# Patient Record
Sex: Male | Born: 1935 | Race: White | Hispanic: No | Marital: Single | Smoking: Former smoker
Health system: Southern US, Community
[De-identification: ages and names within clinical notes are randomized; demographics above are authoritative.]

## PROBLEM LIST (undated history)

## (undated) ENCOUNTER — Emergency Department: Payer: PPO

## (undated) DIAGNOSIS — N200 Calculus of kidney: Secondary | ICD-10-CM

## (undated) DIAGNOSIS — I10 Essential (primary) hypertension: Secondary | ICD-10-CM

## (undated) DIAGNOSIS — E039 Hypothyroidism, unspecified: Secondary | ICD-10-CM

## (undated) DIAGNOSIS — I714 Abdominal aortic aneurysm, without rupture, unspecified: Secondary | ICD-10-CM

## (undated) DIAGNOSIS — Z87442 Personal history of urinary calculi: Secondary | ICD-10-CM

## (undated) DIAGNOSIS — E785 Hyperlipidemia, unspecified: Secondary | ICD-10-CM

## (undated) DIAGNOSIS — M1990 Unspecified osteoarthritis, unspecified site: Secondary | ICD-10-CM

## (undated) DIAGNOSIS — K279 Peptic ulcer, site unspecified, unspecified as acute or chronic, without hemorrhage or perforation: Secondary | ICD-10-CM

## (undated) HISTORY — DX: Hypothyroidism, unspecified: E03.9

## (undated) HISTORY — PX: OTHER SURGICAL HISTORY: SHX169

## (undated) HISTORY — DX: Peptic ulcer, site unspecified, unspecified as acute or chronic, without hemorrhage or perforation: K27.9

## (undated) HISTORY — DX: Calculus of kidney: N20.0

## (undated) HISTORY — PX: CATARACT EXTRACTION, BILATERAL: SHX1313

## (undated) HISTORY — DX: Abdominal aortic aneurysm, without rupture, unspecified: I71.40

## (undated) HISTORY — DX: Hyperlipidemia, unspecified: E78.5

## (undated) HISTORY — DX: Essential (primary) hypertension: I10

## (undated) HISTORY — DX: Abdominal aortic aneurysm, without rupture: I71.4

## (undated) HISTORY — PX: INGUINAL HERNIA REPAIR: SUR1180

## (undated) HISTORY — PX: TONSILLECTOMY: SUR1361

---

## 2001-06-17 ENCOUNTER — Encounter: Payer: Self-pay | Admitting: Ophthalmology

## 2001-06-18 ENCOUNTER — Ambulatory Visit (HOSPITAL_COMMUNITY): Admission: RE | Admit: 2001-06-18 | Discharge: 2001-06-18 | Payer: Self-pay | Admitting: Ophthalmology

## 2001-08-23 ENCOUNTER — Encounter: Payer: Self-pay | Admitting: Otolaryngology

## 2001-08-23 ENCOUNTER — Encounter: Admission: RE | Admit: 2001-08-23 | Discharge: 2001-08-23 | Payer: Self-pay | Admitting: Otolaryngology

## 2002-01-17 ENCOUNTER — Encounter: Admission: RE | Admit: 2002-01-17 | Discharge: 2002-01-17 | Payer: Self-pay | Admitting: Family Medicine

## 2002-01-17 ENCOUNTER — Encounter: Payer: Self-pay | Admitting: Family Medicine

## 2014-08-26 ENCOUNTER — Other Ambulatory Visit (HOSPITAL_COMMUNITY): Payer: Self-pay | Admitting: Internal Medicine

## 2014-08-26 DIAGNOSIS — I447 Left bundle-branch block, unspecified: Secondary | ICD-10-CM

## 2014-09-03 ENCOUNTER — Other Ambulatory Visit (HOSPITAL_COMMUNITY): Payer: Self-pay

## 2014-09-03 DIAGNOSIS — I447 Left bundle-branch block, unspecified: Secondary | ICD-10-CM

## 2014-09-09 ENCOUNTER — Telehealth (HOSPITAL_COMMUNITY): Payer: Self-pay

## 2014-09-09 NOTE — Telephone Encounter (Signed)
Encounter complete. 

## 2014-09-11 ENCOUNTER — Ambulatory Visit (HOSPITAL_COMMUNITY)
Admission: RE | Admit: 2014-09-11 | Discharge: 2014-09-11 | Disposition: A | Discharge status: Home / Self Care | Payer: Medicare Other | Source: Ambulatory Visit | Attending: Cardiovascular Disease | Admitting: Cardiovascular Disease

## 2014-09-11 ENCOUNTER — Encounter (HOSPITAL_COMMUNITY): Payer: Self-pay

## 2014-09-11 DIAGNOSIS — Z8249 Family history of ischemic heart disease and other diseases of the circulatory system: Secondary | ICD-10-CM | POA: Insufficient documentation

## 2014-09-11 DIAGNOSIS — I447 Left bundle-branch block, unspecified: Secondary | ICD-10-CM | POA: Diagnosis not present

## 2014-09-11 DIAGNOSIS — I10 Essential (primary) hypertension: Secondary | ICD-10-CM | POA: Insufficient documentation

## 2014-09-11 DIAGNOSIS — E785 Hyperlipidemia, unspecified: Secondary | ICD-10-CM | POA: Diagnosis not present

## 2014-09-11 DIAGNOSIS — R0609 Other forms of dyspnea: Secondary | ICD-10-CM | POA: Diagnosis present

## 2014-09-11 MED ORDER — TECHNETIUM TC 99M SESTAMIBI GENERIC - CARDIOLITE
10.5000 | Freq: Once | INTRAVENOUS | Status: AC | PRN
  Administered 2014-09-11: 11 via INTRAVENOUS

## 2014-09-11 MED ORDER — TECHNETIUM TC 99M SESTAMIBI GENERIC - CARDIOLITE
30.2000 | Freq: Once | INTRAVENOUS | Status: AC | PRN
Start: 2014-09-11 — End: 2014-09-11
  Administered 2014-09-11: 30.2 via INTRAVENOUS

## 2014-09-11 MED ORDER — REGADENOSON 0.4 MG/5ML IV SOLN
0.4000 mg | Freq: Once | INTRAVENOUS | Status: AC
  Administered 2014-09-11: 0.4 mg via INTRAVENOUS

## 2014-09-11 NOTE — Procedures (Addendum)
[facility] [address] [place] [address] [phone]  Cardiology Nuclear Med Study  Brandon Valdez is a 80 y.o. male     MRN : [id number]     DOB: [birth year]  Procedure Date: 09/11/2014  Nuclear Med Background Indication for Stress Test:  Evaluation for Ischemia History:  No prior cardiac or respiratory history reported. No prior NUC MPI for comparison. Cardiac Risk Factors: Family History - CAD, History of Smoking, Hypertension, LBBB and Lipids  Symptoms:  DOE   Nuclear Pre-Procedure Caffeine/Decaff Intake:  1:00am NPO After: 11am   IV Site: R Forearm  IV 0.9% NS with Angio Cath:  22g  Chest Size (in):  44"  IV Started by: Berdie OgrenAmanda Wease, RN  Height: 5\' 11"  (1.803 m)  Cup Size: n/a  BMI:  Body mass index is 27.07 kg/(m^2). Weight:  194 lb (87.998 kg)   Tech Comments:  n/a    Nuclear Med Study 1 or 2 day study: 1 day  Stress Test Type:  Lexiscan  Order Authorizing Provider:  Thayer HeadingsBrian MacKenzie, MD   Resting Radionuclide: Technetium 4115m Sestamibi  Resting Radionuclide Dose: 10.5 mCi   Stress Radionuclide:  Technetium 3915m Sestamibi  Stress Radionuclide Dose: 30.2 mCi           Stress Protocol Rest HR: 54 Stress HR: 76  Rest BP: 181/104 Stress BP: 160/83  Exercise Time (min): n/a METS: n/a   Predicted Max HR: 142 bpm % Max HR: 53.52 bpm Rate Pressure Product: [id number]  Dose of Adenosine (mg):  n/a Dose of Lexiscan: 0.4 mg  Dose of Atropine (mg): n/a Dose of Dobutamine: n/a mcg/kg/min (at max HR)  Stress Test Technologist: Esperanza Sheetserry-Marie Martin, CCT Nuclear Technologist: Gonzella LexPam Phillips, CNMT   Rest Procedure:  Myocardial perfusion imaging was performed at rest 45 minutes following the intravenous administration of Technetium 4715m Sestamibi. Stress Procedure:  The patient received IV Lexiscan 0.4 mg over 15-seconds.  Technetium 8715m Sestamibi injected IV at 30-seconds.  There were no significant changes with Lexiscan.   Quantitative spect images were obtained after a 45 minute delay.  Transient Ischemic Dilatation (Normal <1.22):  1.01  QGS EDV:  109 ml QGS ESV:  59 ml LV Ejection Fraction: 46%       Rest ECG: NSR-LBBB  Stress ECG: No significant change from baseline ECG  QPS Raw Data Images:  Normal; no motion artifact; normal heart/lung ratio. Stress Images:  There is decreased uptake in the septum. Rest Images:  There is decreased uptake in the septum. Subtraction (SDS):  No reversibility is appreciated.  Impression Exercise Capacity:  Lexiscan with no exercise. BP Response:  Normal blood pressure response. Clinical Symptoms:  No significant symptoms noted. ECG Impression:  No significant ST segment change suggestive of ischemia. Comparison with Prior Nuclear Study: No images to compare  Overall Impression:  Low risk stress nuclear study BBB artifact.  LV Wall Motion:  Mild LV dysfunction with discoordinate septal motion c/w BBB    [name],[name], MD  09/11/2014 5:24 PM

## 2015-12-02 DIAGNOSIS — N200 Calculus of kidney: Secondary | ICD-10-CM | POA: Diagnosis not present

## 2015-12-02 DIAGNOSIS — R911 Solitary pulmonary nodule: Secondary | ICD-10-CM | POA: Diagnosis not present

## 2015-12-02 DIAGNOSIS — N281 Cyst of kidney, acquired: Secondary | ICD-10-CM | POA: Diagnosis not present

## 2015-12-02 DIAGNOSIS — Z0000 Encounter for general adult medical examination without abnormal findings: Secondary | ICD-10-CM | POA: Diagnosis not present

## 2015-12-02 DIAGNOSIS — N400 Enlarged prostate without lower urinary tract symptoms: Secondary | ICD-10-CM | POA: Diagnosis not present

## 2015-12-02 DIAGNOSIS — I714 Abdominal aortic aneurysm, without rupture: Secondary | ICD-10-CM | POA: Diagnosis not present

## 2015-12-02 DIAGNOSIS — R3121 Asymptomatic microscopic hematuria: Secondary | ICD-10-CM | POA: Diagnosis not present

## 2016-02-28 DIAGNOSIS — N400 Enlarged prostate without lower urinary tract symptoms: Secondary | ICD-10-CM | POA: Diagnosis not present

## 2016-02-28 DIAGNOSIS — I129 Hypertensive chronic kidney disease with stage 1 through stage 4 chronic kidney disease, or unspecified chronic kidney disease: Secondary | ICD-10-CM | POA: Diagnosis not present

## 2016-03-07 DIAGNOSIS — I714 Abdominal aortic aneurysm, without rupture: Secondary | ICD-10-CM | POA: Diagnosis not present

## 2016-03-07 DIAGNOSIS — F17211 Nicotine dependence, cigarettes, in remission: Secondary | ICD-10-CM | POA: Diagnosis not present

## 2016-03-07 DIAGNOSIS — N182 Chronic kidney disease, stage 2 (mild): Secondary | ICD-10-CM | POA: Diagnosis not present

## 2016-03-07 DIAGNOSIS — E785 Hyperlipidemia, unspecified: Secondary | ICD-10-CM | POA: Diagnosis not present

## 2016-03-07 DIAGNOSIS — I129 Hypertensive chronic kidney disease with stage 1 through stage 4 chronic kidney disease, or unspecified chronic kidney disease: Secondary | ICD-10-CM | POA: Diagnosis not present

## 2016-03-09 DIAGNOSIS — M542 Cervicalgia: Secondary | ICD-10-CM | POA: Diagnosis not present

## 2016-03-14 DIAGNOSIS — M542 Cervicalgia: Secondary | ICD-10-CM | POA: Diagnosis not present

## 2016-03-17 DIAGNOSIS — M542 Cervicalgia: Secondary | ICD-10-CM | POA: Diagnosis not present

## 2016-03-22 DIAGNOSIS — M542 Cervicalgia: Secondary | ICD-10-CM | POA: Diagnosis not present

## 2016-03-24 DIAGNOSIS — M542 Cervicalgia: Secondary | ICD-10-CM | POA: Diagnosis not present

## 2016-03-29 DIAGNOSIS — M542 Cervicalgia: Secondary | ICD-10-CM | POA: Diagnosis not present

## 2016-04-10 DIAGNOSIS — I129 Hypertensive chronic kidney disease with stage 1 through stage 4 chronic kidney disease, or unspecified chronic kidney disease: Secondary | ICD-10-CM | POA: Diagnosis not present

## 2016-06-06 DIAGNOSIS — M8580 Other specified disorders of bone density and structure, unspecified site: Secondary | ICD-10-CM | POA: Diagnosis not present

## 2016-06-06 DIAGNOSIS — I129 Hypertensive chronic kidney disease with stage 1 through stage 4 chronic kidney disease, or unspecified chronic kidney disease: Secondary | ICD-10-CM | POA: Diagnosis not present

## 2016-06-08 DIAGNOSIS — R310 Gross hematuria: Secondary | ICD-10-CM | POA: Diagnosis not present

## 2016-06-13 DIAGNOSIS — I714 Abdominal aortic aneurysm, without rupture: Secondary | ICD-10-CM | POA: Diagnosis not present

## 2016-06-13 DIAGNOSIS — N182 Chronic kidney disease, stage 2 (mild): Secondary | ICD-10-CM | POA: Diagnosis not present

## 2016-06-13 DIAGNOSIS — F17211 Nicotine dependence, cigarettes, in remission: Secondary | ICD-10-CM | POA: Diagnosis not present

## 2016-06-13 DIAGNOSIS — I129 Hypertensive chronic kidney disease with stage 1 through stage 4 chronic kidney disease, or unspecified chronic kidney disease: Secondary | ICD-10-CM | POA: Diagnosis not present

## 2016-06-13 DIAGNOSIS — E785 Hyperlipidemia, unspecified: Secondary | ICD-10-CM | POA: Diagnosis not present

## 2016-09-12 DIAGNOSIS — Z23 Encounter for immunization: Secondary | ICD-10-CM | POA: Diagnosis not present

## 2016-09-12 DIAGNOSIS — N390 Urinary tract infection, site not specified: Secondary | ICD-10-CM | POA: Diagnosis not present

## 2016-09-12 DIAGNOSIS — Z0000 Encounter for general adult medical examination without abnormal findings: Secondary | ICD-10-CM | POA: Diagnosis not present

## 2016-09-12 DIAGNOSIS — I129 Hypertensive chronic kidney disease with stage 1 through stage 4 chronic kidney disease, or unspecified chronic kidney disease: Secondary | ICD-10-CM | POA: Diagnosis not present

## 2016-09-12 DIAGNOSIS — Z125 Encounter for screening for malignant neoplasm of prostate: Secondary | ICD-10-CM | POA: Diagnosis not present

## 2016-09-12 DIAGNOSIS — E559 Vitamin D deficiency, unspecified: Secondary | ICD-10-CM | POA: Diagnosis not present

## 2016-09-12 DIAGNOSIS — M8580 Other specified disorders of bone density and structure, unspecified site: Secondary | ICD-10-CM | POA: Diagnosis not present

## 2016-09-19 DIAGNOSIS — R911 Solitary pulmonary nodule: Secondary | ICD-10-CM | POA: Diagnosis not present

## 2016-09-19 DIAGNOSIS — I714 Abdominal aortic aneurysm, without rupture: Secondary | ICD-10-CM | POA: Diagnosis not present

## 2016-09-19 DIAGNOSIS — I10 Essential (primary) hypertension: Secondary | ICD-10-CM | POA: Diagnosis not present

## 2016-09-19 DIAGNOSIS — R3129 Other microscopic hematuria: Secondary | ICD-10-CM | POA: Diagnosis not present

## 2016-10-18 ENCOUNTER — Encounter: Payer: Self-pay | Admitting: Vascular Surgery

## 2016-10-18 DIAGNOSIS — H1840 Unspecified corneal degeneration: Secondary | ICD-10-CM | POA: Diagnosis not present

## 2016-10-18 DIAGNOSIS — H524 Presbyopia: Secondary | ICD-10-CM | POA: Diagnosis not present

## 2016-10-24 ENCOUNTER — Ambulatory Visit (INDEPENDENT_AMBULATORY_CARE_PROVIDER_SITE_OTHER): Payer: PPO | Admitting: Vascular Surgery

## 2016-10-24 ENCOUNTER — Encounter: Payer: Self-pay | Admitting: Vascular Surgery

## 2016-10-24 ENCOUNTER — Other Ambulatory Visit: Payer: Self-pay | Admitting: *Deleted

## 2016-10-24 VITALS — BP 146/80 | HR 75 | Temp 97.2°F | Resp 20 | Ht 71.0 in | Wt 194.5 lb

## 2016-10-24 DIAGNOSIS — I714 Abdominal aortic aneurysm, without rupture, unspecified: Secondary | ICD-10-CM

## 2016-10-24 DIAGNOSIS — I739 Peripheral vascular disease, unspecified: Secondary | ICD-10-CM

## 2016-10-24 NOTE — Progress Notes (Signed)
Referred by: Dr. Thayer HeadingsBrian Mackenzie  History of Present Illness:  Patient is a 82 y.o. year old male who presents for evaluation of abdominal aortic aneurysm.  The aneurysm is currently 4.2 cm in diameter by CT performed on 11/12/2015.  The patient denies abdominal pain.  The patient denies back pain.  The patient hahas family history of AAA.  Other medical problems include HTN managed with Cozaar and Aspirin daily.  He denise DM and CAD.  History reviewed. No pertinent past medical history.  Past Surgical History:  Procedure Laterality Date  . CATARACT EXTRACTION, BILATERAL    . INGUINAL HERNIA REPAIR    . spine cyst    . TONSILLECTOMY       Social History Social History  Substance Use Topics  . Smoking status: Former Games developermoker  . Smokeless tobacco: Former NeurosurgeonUser    Quit date: 11/21/1975  . Alcohol use Yes    Family History Family History  Problem Relation Age of Onset  . AAA (abdominal aortic aneurysm) Father     Allergies  Allergies  Allergen Reactions  . Monopril [Fosinopril] Other (See Comments)    Unspecified   . Valium [Diazepam] Other (See Comments)    unspecified     Current Outpatient Prescriptions  Medication Sig Dispense Refill  . Ascorbic Acid (VITAMIN C) 1000 MG tablet Take 1,000 mg by mouth daily.    Marland Kitchen. aspirin 81 MG tablet Take 81 mg by mouth daily.    Marland Kitchen. ibuprofen (ADVIL,MOTRIN) 200 MG tablet Take 200 mg by mouth every 6 (six) hours as needed.    Marland Kitchen. levothyroxine (SYNTHROID, LEVOTHROID) 125 MCG tablet     . losartan (COZAAR) 100 MG tablet     . Multiple Vitamin (MULTIVITAMIN) capsule Take 1 capsule by mouth daily.    . psyllium (METAMUCIL) 58.6 % packet Take 1 packet by mouth daily.    Marland Kitchen. SILDENAFIL CITRATE PO Take by mouth.    . vitamin E 400 UNIT capsule Take 400 Units by mouth daily.     No current facility-administered medications for this visit.     ROS:   General:  No weight loss, Fever, chills  HEENT: No recent headaches, no nasal  bleeding, no visual changes, no sore throat  Neurologic: No dizziness, blackouts, seizures. No recent symptoms of stroke or mini- stroke. No recent episodes of slurred speech, or temporary blindness.  Cardiac: No recent episodes of chest pain/pressure, no shortness of breath at rest.  No shortness of breath with exertion.  Denies history of atrial fibrillation or irregular heartbeat  Vascular: No history of rest pain in feet.  No history of claudication.  No history of non-healing ulcer, No history of DVT   Pulmonary: No home oxygen, no productive cough, no hemoptysis,  No asthma or wheezing  Musculoskeletal:  [x ] Arthritis, [ ]  Low back pain,  [ ]  Joint pain  Hematologic:No history of hypercoagulable state.  No history of easy bleeding.  No history of anemia  Gastrointestinal: No hematochezia or melena,  No gastroesophageal reflux, no trouble swallowing  Urinary: [ ]  chronic Kidney disease, [ ]  on HD - [ ]  MWF or [ ]  TTHS, [ ]  Burning with urination, [ ]  Frequent urination, [ ]  Difficulty urinating;   Skin: No rashes  Psychological: No history of anxiety,  No history of depression   Physical Examination  Vitals:   10/24/16 1412 10/24/16 1415  BP: (!) 154/83 (!) 146/80  Pulse: 75   Resp: 20  Temp: 97.2 F (36.2 C)   TempSrc: Oral   SpO2: 95%   Weight: 194 lb 8 oz (88.2 kg)   Height: 5\' 11"  (1.803 m)     Body mass index is 27.13 kg/m.  General:  Alert and oriented, no acute distress HEENT: Normal Neck: No bruit or JVD Pulmonary: Clear to auscultation bilaterally Cardiac: Regular Rate and Rhythm without murmur Gastrointestinal: Soft, non-tender, non-distended, no mass, no scars, palpable abdominal aortic pulse. Skin: No rash Extremity Pulses:  2+ radial, brachial, femoral, popliteal, dorsalis pedis, posterior tibial pulses bilaterally Musculoskeletal: No deformity or edema  Neurologic: Upper and lower extremity motor 5/5 and symmetric  DATA:  CT abdomin Aorta  below the renals measuring 3.0 and largest diameter 4.2 cm.  With prominent iliacs as well symetrically.   ASSESSMENT:  Arterial Megally   PLAN: Aortic and bilateral popliteal arteries ultrasound for surveillance sometime in the next 2-3 weeks.  Dr. Arbie CookeyEarly will review these studies.  Then he will follow up in 1 year for repeat Ultrasound studies.    Activity as tolerates without restrictions. We recommend control up HTN.   Thomasena EdisOLLINS, EMMA MAUREEN PA-C [facility]  The patient was seen in conjunction with Dr. Arbie CookeyEarly today  I have examined the patient, reviewed and agree with above. Very pleasant gentleman with incidental finding of a 4 cm aneurysm on CT scan evaluating hematuria one year ago. He is quite anxious about this since his father died of a ruptured aneurysm. He has not had any further imaging studies since his last study in one year ago in December. I did review his actual films. This does reveal diffuse arteriomegaly. His native aorta at the level of the renals is 3 cm in his maximal diameter is 4.1 cm. I discussed the significance of this with the patient and his wife explaining that this in all likelihood is less risky than 4 cm dilatation with a normal caliber artery. He does have prominent popliteal pulses as well. Since he is one year out from any imaging studies we will obtain a duplex of his aorta and popliteals now within the next 1 or 2 weeks at his convenience. Assuming this is unchanged we will then follow him on a yearly basis with a duplex of his aorta to rule out progression in size  Gretta BeganEarly, Rickie Gange, MD 10/24/2016 3:06 PM

## 2016-10-25 ENCOUNTER — Encounter: Payer: Self-pay | Admitting: Urology

## 2016-10-25 ENCOUNTER — Encounter: Payer: Self-pay | Admitting: Internal Medicine

## 2016-10-25 ENCOUNTER — Other Ambulatory Visit: Payer: Self-pay | Admitting: Internal Medicine

## 2016-10-25 DIAGNOSIS — R911 Solitary pulmonary nodule: Secondary | ICD-10-CM

## 2016-10-26 ENCOUNTER — Other Ambulatory Visit: Payer: PPO

## 2016-10-27 ENCOUNTER — Telehealth: Payer: Self-pay | Admitting: Cardiology

## 2016-10-27 NOTE — Telephone Encounter (Signed)
[facility] 11/02/16 with Dr Jens Somrenshaw.  [facility] for Dr Ludwig Clarksrenshaw's schedule on 11/02/16. lp

## 2016-10-30 NOTE — Progress Notes (Signed)
HPI: 82 year old male for evaluation of AAA. Nuclear study October 2015 showed ejection fraction 46% with left bundle branch block artifact and no ischemia. CT December 2016 showed 4.2 cm abdominal aortic aneurysm which is being followed by vascular surgery. Also note of lung nodule and coronary calcification. Patient denies dyspnea, chest pain, palpitations, syncope or claudication. He has no abdominal pain.   Current Outpatient Prescriptions  Medication Sig Dispense Refill  . Ascorbic Acid (VITAMIN C) 1000 MG tablet Take 1,000 mg by mouth daily.    Marland Kitchen. aspirin 81 MG tablet Take 81 mg by mouth daily.    Marland Kitchen. ibuprofen (ADVIL,MOTRIN) 200 MG tablet Take 200 mg by mouth every 6 (six) hours as needed.    Marland Kitchen. levothyroxine (SYNTHROID, LEVOTHROID) 125 MCG tablet     . losartan (COZAAR) 100 MG tablet     . Multiple Vitamin (MULTIVITAMIN) capsule Take 1 capsule by mouth daily.    . psyllium (METAMUCIL) 58.6 % packet Take 1 packet by mouth daily.    Marland Kitchen. SILDENAFIL CITRATE PO Take by mouth.    . vitamin E 400 UNIT capsule Take 400 Units by mouth daily.     No current facility-administered medications for this visit.     Allergies  Allergen Reactions  . Monopril [Fosinopril] Other (See Comments)    Unspecified   . Valium [Diazepam] Other (See Comments)    unspecified     Past Medical History:  Diagnosis Date  . AAA (abdominal aortic aneurysm) (HCC)   . Hyperlipidemia   . Hypertension   . Hypothyroid   . Nephrolithiasis   . PUD (peptic ulcer disease)     Past Surgical History:  Procedure Laterality Date  . CATARACT EXTRACTION, BILATERAL    . INGUINAL HERNIA REPAIR    . spine cyst    . TONSILLECTOMY      Social History   Social History  . Marital status: Single    Spouse name: N/A  . Number of children: 3  . Years of education: N/A   Occupational History  . Not on file.   Social History Main Topics  . Smoking status: Former Games developermoker  . Smokeless tobacco: Former NeurosurgeonUser   Quit date: 11/21/1975  . Alcohol use Yes     Comment: Occasional  . Drug use: No  . Sexual activity: Not on file   Other Topics Concern  . Not on file   Social History Narrative  . No narrative on file    Family History  Problem Relation Age of Onset  . AAA (abdominal aortic aneurysm) Father   . AAA (abdominal aortic aneurysm) Brother     ROS: no fevers or chills, productive cough, hemoptysis, dysphasia, odynophagia, melena, hematochezia, dysuria, hematuria, rash, seizure activity, orthopnea, PND, pedal edema, claudication. Remaining systems are negative.  Physical Exam:   Blood pressure (!) 152/84, pulse 76, height 5\' 9"  (1.753 m), weight 197 lb (89.4 kg).  General:  Well developed/well nourished in NAD Skin warm/dry Patient not depressed No peripheral clubbing Back-normal HEENT-normal/normal eyelids Neck supple/normal carotid upstroke bilaterally; no bruits; no JVD; no thyromegaly chest - CTA/ normal expansion CV - RRR/normal S1 and S2; no murmurs, rubs or gallops;  PMI nondisplaced Abdomen -NT/ND, no HSM, no mass, + bowel sounds, no bruit 2+ femoral pulses, no bruits Ext-no edema, chords, 2+ DP Neuro-grossly nonfocal  ECG sinus rhythm at a rate of 76. Left bundle branch block.  A/P  1 abdominal aortic aneurysm-plan follow-up ultrasound as scheduled on December  18. He is followed by vascular surgery. We discussed that it typically would not need repair until approximately 5.5 cm.  2 hypertension-blood pressure is mildly elevated. I have asked him to follow this at home and we will add additional medications as needed.  3 hyperlipidemia-patient is intolerant to statins. Continue diet. Managed by primary care.   Olga MillersBrian Crenshaw, MD

## 2016-10-31 ENCOUNTER — Ambulatory Visit (HOSPITAL_COMMUNITY)
Admission: RE | Admit: 2016-10-31 | Discharge: 2016-10-31 | Disposition: A | Discharge status: Home / Self Care | Payer: PPO | Source: Ambulatory Visit | Attending: Vascular Surgery | Admitting: Vascular Surgery

## 2016-10-31 ENCOUNTER — Other Ambulatory Visit: Payer: Self-pay

## 2016-10-31 DIAGNOSIS — I714 Abdominal aortic aneurysm, without rupture, unspecified: Secondary | ICD-10-CM

## 2016-10-31 DIAGNOSIS — I739 Peripheral vascular disease, unspecified: Secondary | ICD-10-CM

## 2016-11-02 ENCOUNTER — Encounter: Payer: Self-pay | Admitting: Cardiology

## 2016-11-02 ENCOUNTER — Ambulatory Visit (INDEPENDENT_AMBULATORY_CARE_PROVIDER_SITE_OTHER): Payer: PPO | Admitting: Cardiology

## 2016-11-02 VITALS — BP 152/84 | HR 76 | Ht 69.0 in | Wt 197.0 lb

## 2016-11-02 DIAGNOSIS — I714 Abdominal aortic aneurysm, without rupture, unspecified: Secondary | ICD-10-CM

## 2016-11-02 DIAGNOSIS — I10 Essential (primary) hypertension: Secondary | ICD-10-CM | POA: Diagnosis not present

## 2016-11-02 DIAGNOSIS — E7800 Pure hypercholesterolemia, unspecified: Secondary | ICD-10-CM | POA: Diagnosis not present

## 2016-11-02 NOTE — Patient Instructions (Signed)
Your physician wants you to follow-up in: ONE YEAR WITH DR CRENSHAW You will receive a reminder letter in the mail two months in advance. If you don't receive a letter, please call our office to schedule the follow-up appointment.   If you need a refill on your cardiac medications before your next appointment, please call your pharmacy.  

## 2016-11-06 ENCOUNTER — Encounter: Payer: Self-pay | Admitting: Vascular Surgery

## 2016-11-07 ENCOUNTER — Ambulatory Visit (INDEPENDENT_AMBULATORY_CARE_PROVIDER_SITE_OTHER): Payer: PPO | Admitting: Vascular Surgery

## 2016-11-07 ENCOUNTER — Ambulatory Visit (HOSPITAL_COMMUNITY)
Admission: RE | Admit: 2016-11-07 | Discharge: 2016-11-07 | Disposition: A | Discharge status: Home / Self Care | Payer: PPO | Source: Ambulatory Visit | Attending: Vascular Surgery | Admitting: Vascular Surgery

## 2016-11-07 ENCOUNTER — Encounter: Payer: Self-pay | Admitting: Vascular Surgery

## 2016-11-07 VITALS — BP 160/88 | HR 64 | Temp 97.0°F | Resp 18 | Ht 69.0 in | Wt 194.5 lb

## 2016-11-07 DIAGNOSIS — I714 Abdominal aortic aneurysm, without rupture, unspecified: Secondary | ICD-10-CM

## 2016-11-07 NOTE — Progress Notes (Signed)
[facility]  Patient name: Brandon HughJimmy C Seger MRN: [id number] DOB: 1934/03/01 Sex: male  REASON FOR VISIT: Ultrasound follow-up of abdominal aortic aneurysm and popliteal enlargement  HPI: Brandon Valdez is a 82 y.o. male here today with his son for follow-up. She had a CT scan for other causes showing arteriomegaly and a infrarenal abdominal aortic aneurysm. His last scan was one year ago. He is here today for ultrasound follow-up. Tonight seen him several weeks ago he had not had his imaging studies and on physical exam did have prominent popliteal pulses. He underwent the popliteal duplexes well. He is here today with his son. He does have a very strong family history of aneurysms in his father with a death from a rupture and also in his brother. He remains in very good health.  Past Medical History:  Diagnosis Date  . AAA (abdominal aortic aneurysm) (HCC)   . Hyperlipidemia   . Hypertension   . Hypothyroid   . Nephrolithiasis   . PUD (peptic ulcer disease)     Family History  Problem Relation Age of Onset  . AAA (abdominal aortic aneurysm) Father   . AAA (abdominal aortic aneurysm) Brother     SOCIAL HISTORY: Social History  Substance Use Topics  . Smoking status: Former Games developermoker  . Smokeless tobacco: Former NeurosurgeonUser    Quit date: 11/21/1975  . Alcohol use Yes     Comment: Occasional    Allergies  Allergen Reactions  . Monopril [Fosinopril] Other (See Comments)    Unspecified   . Valium [Diazepam] Other (See Comments)    unspecified    Current Outpatient Prescriptions  Medication Sig Dispense Refill  . Ascorbic Acid (VITAMIN C) 1000 MG tablet Take 1,000 mg by mouth daily.    Marland Kitchen. aspirin 81 MG tablet Take 81 mg by mouth daily.    Marland Kitchen. ibuprofen (ADVIL,MOTRIN) 200 MG tablet Take 200 mg by mouth every 6 (six) hours as needed.    Marland Kitchen. levothyroxine (SYNTHROID, LEVOTHROID) 125 MCG tablet     . losartan (COZAAR)  100 MG tablet     . Multiple Vitamin (MULTIVITAMIN) capsule Take 1 capsule by mouth daily.    . psyllium (METAMUCIL) 58.6 % packet Take 1 packet by mouth daily.    Marland Kitchen. SILDENAFIL CITRATE PO Take by mouth.    . vitamin E 400 UNIT capsule Take 400 Units by mouth daily.     No current facility-administered medications for this visit.     REVIEW OF SYSTEMS:  [X]  denotes positive finding, [ ]  denotes negative finding Cardiac  Comments:  Chest pain or chest pressure:    Shortness of breath upon exertion:    Short of breath when lying flat:    Irregular heart rhythm:        Vascular    Pain in calf, thigh, or hip brought on by ambulation:    Pain in feet at night that wakes you up from your sleep:     Blood clot in your veins:    Leg swelling:           PHYSICAL EXAM: Vitals:   11/07/16 0846 11/07/16 0848  BP: (!) 156/91 (!) 160/88  Pulse: 64   Resp: 18   Temp: 97 F (36.1 C)   TempSrc: Oral   SpO2: 94%   Weight: 194 lb 8 oz (88.2 kg)   Height: 5\' 9"  (1.753 m)     GENERAL: The patient is a well-nourished male,  in no acute distress. The vital signs are documented above. CARDIOVASCULAR: 2+ radial 2+ femoral 2-3+ popliteal and 3+ posterior tibial pulses bilaterally PULMONARY: There is good air exchange  MUSCULOSKELETAL: There are no major deformities or cyanosis. NEUROLOGIC: No focal weakness or paresthesias are detected. SKIN: There are no ulcers or rashes noted. PSYCHIATRIC: The patient has a normal affect. Abdomen soft and nontender. No aneurysm palpable  DATA:  Popliteal duplex from 10/31/2016 revealed slight popliteal dilatation to just over 1 cm. Aortic duplex today reveals maximal diameter 3.9 cm  MEDICAL ISSUES: Stable overall. Diffuse arteriomegaly with small abdominal aortic aneurysm. No evidence of popliteal artery aneurysms. Have recommended continued yearly ultrasound of his aorta to rule out enlargement. We will coordinate this in one year and he will see our  nurse practitioner for ongoing follow-up.    Larina Earthlyodd F. Jeweldean Drohan, MD FACS [facility] Office Tel [phone] Pager [phone]

## 2016-11-10 DIAGNOSIS — R05 Cough: Secondary | ICD-10-CM | POA: Diagnosis not present

## 2016-11-10 DIAGNOSIS — R112 Nausea with vomiting, unspecified: Secondary | ICD-10-CM | POA: Diagnosis not present

## 2016-11-10 DIAGNOSIS — R42 Dizziness and giddiness: Secondary | ICD-10-CM | POA: Diagnosis not present

## 2016-11-10 DIAGNOSIS — R404 Transient alteration of awareness: Secondary | ICD-10-CM | POA: Diagnosis not present

## 2016-11-10 DIAGNOSIS — I10 Essential (primary) hypertension: Secondary | ICD-10-CM | POA: Diagnosis not present

## 2016-11-10 DIAGNOSIS — J309 Allergic rhinitis, unspecified: Secondary | ICD-10-CM | POA: Diagnosis not present

## 2016-11-11 DIAGNOSIS — R05 Cough: Secondary | ICD-10-CM | POA: Diagnosis not present

## 2016-11-11 DIAGNOSIS — R42 Dizziness and giddiness: Secondary | ICD-10-CM | POA: Diagnosis not present

## 2016-11-23 DIAGNOSIS — Z9109 Other allergy status, other than to drugs and biological substances: Secondary | ICD-10-CM | POA: Insufficient documentation

## 2016-11-23 DIAGNOSIS — J301 Allergic rhinitis due to pollen: Secondary | ICD-10-CM | POA: Diagnosis not present

## 2016-11-23 DIAGNOSIS — J300 Vasomotor rhinitis: Secondary | ICD-10-CM | POA: Insufficient documentation

## 2016-11-23 DIAGNOSIS — H90A22 Sensorineural hearing loss, unilateral, left ear, with restricted hearing on the contralateral side: Secondary | ICD-10-CM | POA: Insufficient documentation

## 2016-11-23 DIAGNOSIS — H8120 Vestibular neuronitis, unspecified ear: Secondary | ICD-10-CM | POA: Insufficient documentation

## 2017-02-02 ENCOUNTER — Other Ambulatory Visit: Payer: Self-pay | Admitting: Internal Medicine

## 2017-02-02 DIAGNOSIS — R911 Solitary pulmonary nodule: Secondary | ICD-10-CM

## 2017-02-09 ENCOUNTER — Ambulatory Visit
Admission: RE | Admit: 2017-02-09 | Discharge: 2017-02-09 | Disposition: A | Discharge status: Home / Self Care | Payer: PPO | Source: Ambulatory Visit | Attending: Internal Medicine | Admitting: Internal Medicine

## 2017-02-09 DIAGNOSIS — R911 Solitary pulmonary nodule: Secondary | ICD-10-CM

## 2017-02-09 DIAGNOSIS — R918 Other nonspecific abnormal finding of lung field: Secondary | ICD-10-CM | POA: Diagnosis not present

## 2017-03-19 DIAGNOSIS — I10 Essential (primary) hypertension: Secondary | ICD-10-CM | POA: Diagnosis not present

## 2017-03-19 DIAGNOSIS — E039 Hypothyroidism, unspecified: Secondary | ICD-10-CM | POA: Diagnosis not present

## 2017-03-19 DIAGNOSIS — R911 Solitary pulmonary nodule: Secondary | ICD-10-CM | POA: Diagnosis not present

## 2017-03-19 DIAGNOSIS — I714 Abdominal aortic aneurysm, without rupture: Secondary | ICD-10-CM | POA: Diagnosis not present

## 2017-07-05 DIAGNOSIS — J309 Allergic rhinitis, unspecified: Secondary | ICD-10-CM | POA: Diagnosis not present

## 2017-07-05 DIAGNOSIS — N281 Cyst of kidney, acquired: Secondary | ICD-10-CM | POA: Diagnosis not present

## 2017-07-05 DIAGNOSIS — J069 Acute upper respiratory infection, unspecified: Secondary | ICD-10-CM | POA: Diagnosis not present

## 2017-07-05 DIAGNOSIS — R3121 Asymptomatic microscopic hematuria: Secondary | ICD-10-CM | POA: Diagnosis not present

## 2017-09-18 DIAGNOSIS — I129 Hypertensive chronic kidney disease with stage 1 through stage 4 chronic kidney disease, or unspecified chronic kidney disease: Secondary | ICD-10-CM | POA: Diagnosis not present

## 2017-09-18 DIAGNOSIS — E039 Hypothyroidism, unspecified: Secondary | ICD-10-CM | POA: Diagnosis not present

## 2017-09-18 DIAGNOSIS — Z0000 Encounter for general adult medical examination without abnormal findings: Secondary | ICD-10-CM | POA: Diagnosis not present

## 2017-09-18 DIAGNOSIS — Z125 Encounter for screening for malignant neoplasm of prostate: Secondary | ICD-10-CM | POA: Diagnosis not present

## 2017-09-25 DIAGNOSIS — I714 Abdominal aortic aneurysm, without rupture: Secondary | ICD-10-CM | POA: Diagnosis not present

## 2017-09-25 DIAGNOSIS — I10 Essential (primary) hypertension: Secondary | ICD-10-CM | POA: Diagnosis not present

## 2017-09-25 DIAGNOSIS — E785 Hyperlipidemia, unspecified: Secondary | ICD-10-CM | POA: Diagnosis not present

## 2017-09-25 DIAGNOSIS — E039 Hypothyroidism, unspecified: Secondary | ICD-10-CM | POA: Diagnosis not present

## 2017-09-25 DIAGNOSIS — R3129 Other microscopic hematuria: Secondary | ICD-10-CM | POA: Diagnosis not present

## 2017-10-30 ENCOUNTER — Ambulatory Visit: Payer: PPO | Admitting: Vascular Surgery

## 2017-10-30 ENCOUNTER — Other Ambulatory Visit (HOSPITAL_COMMUNITY): Payer: PPO

## 2017-11-27 DIAGNOSIS — E039 Hypothyroidism, unspecified: Secondary | ICD-10-CM | POA: Diagnosis not present

## 2017-12-03 NOTE — Progress Notes (Signed)
HPI: FU AAA. Nuclear study October 2015 showed ejection fraction 46% with left bundle branch block artifact and no ischemia. CT December 2016 showed 4.2 cm abdominal aortic aneurysm. Abdominal ultrasound December 2017 showed 3.9 x 3.9 cm abdominal aortic aneurysm. Also with bilateral common iliac artery aneurysms. Followed by vascular surgery. Since last seen the patient denies any dyspnea on exertion, orthopnea, PND, pedal edema, palpitations, syncope or chest pain.   Current Outpatient Medications  Medication Sig Dispense Refill  . aspirin 81 MG tablet Take 81 mg by mouth daily.    . Glucosamine-Chondroit-Vit C-Mn (GLUCOSAMINE 1500 COMPLEX PO) Take 1,500 mg by mouth daily.    . hydrochlorothiazide (MICROZIDE) 12.5 MG capsule Take 12.5 mg by mouth daily.    Marland Kitchen. ibuprofen (ADVIL,MOTRIN) 200 MG tablet Take 200 mg by mouth every 6 (six) hours as needed.    Marland Kitchen. levothyroxine (SYNTHROID, LEVOTHROID) 137 MCG tablet Take 137 mcg by mouth daily before breakfast.    . losartan (COZAAR) 100 MG tablet     . Multiple Vitamin (MULTIVITAMIN) capsule Take 1 capsule by mouth daily.    . psyllium (METAMUCIL) 58.6 % packet Take 1 packet by mouth daily.    . Saw Palmetto 450 MG CAPS Take 1 capsule by mouth daily.    . vitamin B-12 (CYANOCOBALAMIN) 1000 MCG tablet Take 1,000 mcg by mouth daily.     No current facility-administered medications for this visit.      Past Medical History:  Diagnosis Date  . AAA (abdominal aortic aneurysm) (HCC)   . Hyperlipidemia   . Hypertension   . Hypothyroid   . Nephrolithiasis   . PUD (peptic ulcer disease)     Past Surgical History:  Procedure Laterality Date  . CATARACT EXTRACTION, BILATERAL    . INGUINAL HERNIA REPAIR    . spine cyst    . TONSILLECTOMY      Social History   Socioeconomic History  . Marital status: Single    Spouse name: Not on file  . Number of children: 3  . Years of education: Not on file  . Highest education level: Not on file    Social Needs  . Financial resource strain: Not on file  . Food insecurity - worry: Not on file  . Food insecurity - inability: Not on file  . Transportation needs - medical: Not on file  . Transportation needs - non-medical: Not on file  Occupational History  . Not on file  Tobacco Use  . Smoking status: Former Games developermoker  . Smokeless tobacco: Former NeurosurgeonUser    Quit date: 11/21/1975  Substance and Sexual Activity  . Alcohol use: Yes    Comment: Occasional  . Drug use: No  . Sexual activity: Not on file  Other Topics Concern  . Not on file  Social History Narrative  . Not on file    Family History  Problem Relation Age of Onset  . AAA (abdominal aortic aneurysm) Father   . AAA (abdominal aortic aneurysm) Brother     ROS: no fevers or chills, productive cough, hemoptysis, dysphasia, odynophagia, melena, hematochezia, dysuria, hematuria, rash, seizure activity, orthopnea, PND, pedal edema, claudication. Remaining systems are negative.  Physical Exam: Well-developed well-nourished in no acute distress.  Skin is warm and dry.  HEENT is normal.  Neck is supple.  Chest is clear to auscultation with normal expansion.  Cardiovascular exam is regular rate and rhythm.  Abdominal exam nontender or distended. No masses palpated. Extremities show no edema.  neuro grossly intact  ECG-sinus rhythm at a rate of 63. Left bundle branch block. personally reviewed  A/P  1 abdominal aortic aneurysm-he is scheduled for follow-up ultrasound with vascular surgery in February. Would need to consider repair if it increases to 5.5 cm.  2 hypertension-blood pressure is controlled. Continue present medications.  3 hyperlipidemia-patient with question intolerance to statins in the past. His most recent LDL was 133 in October. Given his abdominal aortic aneurysm I would like for him to be on a statin long-term if possible. We will begin Crestor 20 mg daily. He will contact us with any side effects.  Otherwise check lipids and liver in 4 weeks.  Olga Millers, MD

## 2017-12-06 ENCOUNTER — Ambulatory Visit: Payer: PPO | Admitting: Cardiology

## 2017-12-06 ENCOUNTER — Encounter: Payer: Self-pay | Admitting: Cardiology

## 2017-12-06 VITALS — BP 138/76 | HR 63 | Ht 73.0 in | Wt 186.0 lb

## 2017-12-06 DIAGNOSIS — Z79899 Other long term (current) drug therapy: Secondary | ICD-10-CM

## 2017-12-06 DIAGNOSIS — E785 Hyperlipidemia, unspecified: Secondary | ICD-10-CM | POA: Diagnosis not present

## 2017-12-06 DIAGNOSIS — I10 Essential (primary) hypertension: Secondary | ICD-10-CM

## 2017-12-06 DIAGNOSIS — I714 Abdominal aortic aneurysm, without rupture, unspecified: Secondary | ICD-10-CM

## 2017-12-06 MED ORDER — ROSUVASTATIN CALCIUM 20 MG PO TABS: 20.0000 mg | ORAL_TABLET | Freq: Every day | ORAL | 6 refills | Status: DC

## 2017-12-06 NOTE — Patient Instructions (Signed)
MEDICATION INSTRUCTION   --- START CRESTOR ( ROSUVASTATIN) 20 MG TAKE ONE TABLET AT BEDTIME. ( LET OFFICE KNOW IF YOU HAVE ANY ISSUES WITH MEDICATION)    LABS - 4 WEEKS AFTER STARTING CRESTOR (ROSUVASTATIN ) DO NOT EAT OR DRINK THE MORNING OF THE TEST LIPIDS HEPATIC PANEL     Your physician wants you to follow-up in 12 MONTHS WITH DR CRENSHAW. You will receive a reminder letter in the mail two months in advance. If you don't receive a letter, please call our office to schedule the follow-up appointment.   If you need a refill on your cardiac medications before your next appointment, please call your pharmacy.

## 2017-12-25 ENCOUNTER — Ambulatory Visit: Payer: PPO | Admitting: Vascular Surgery

## 2017-12-25 ENCOUNTER — Encounter: Payer: Self-pay | Admitting: Vascular Surgery

## 2017-12-25 ENCOUNTER — Ambulatory Visit (HOSPITAL_COMMUNITY)
Admission: RE | Admit: 2017-12-25 | Discharge: 2017-12-25 | Disposition: A | Discharge status: Home / Self Care | Payer: PPO | Source: Ambulatory Visit | Attending: Vascular Surgery | Admitting: Vascular Surgery

## 2017-12-25 VITALS — BP 135/77 | HR 63 | Resp 20 | Ht 73.0 in | Wt 185.4 lb

## 2017-12-25 DIAGNOSIS — I714 Abdominal aortic aneurysm, without rupture, unspecified: Secondary | ICD-10-CM

## 2017-12-25 NOTE — Progress Notes (Signed)
Vascular and Vein Specialist of [facility]  Patient name: Brandon Valdez MRN: [id number] DOB: Feb 25, 1934 Sex: male  REASON FOR VISIT: Follow-up asymptomatic infrarenal abdominal aortic aneurysm  HPI: Brandon Valdez is a 84 y.o. male here today for follow-up.  He continues to be in very good health with no new major medical difficulties.  He is here today with his son.  He has no symptoms referable to his aneurysm.  Past Medical History:  Diagnosis Date  . AAA (abdominal aortic aneurysm) (HCC)   . Hyperlipidemia   . Hypertension   . Hypothyroid   . Nephrolithiasis   . PUD (peptic ulcer disease)     Family History  Problem Relation Age of Onset  . AAA (abdominal aortic aneurysm) Father   . AAA (abdominal aortic aneurysm) Brother     SOCIAL HISTORY: Social History   Tobacco Use  . Smoking status: Former Games developer  . Smokeless tobacco: Former Neurosurgeon    Quit date: 11/21/1975  Substance Use Topics  . Alcohol use: Yes    Comment: Occasional    Allergies  Allergen Reactions  . Monopril [Fosinopril] Other (See Comments)    Unspecified   . Valium [Diazepam] Other (See Comments)    unspecified    Current Outpatient Medications  Medication Sig Dispense Refill  . aspirin 81 MG tablet Take 81 mg by mouth daily.    . Glucosamine-Chondroit-Vit C-Mn (GLUCOSAMINE 1500 COMPLEX PO) Take 1,500 mg by mouth daily.    . hydrochlorothiazide (MICROZIDE) 12.5 MG capsule Take 12.5 mg by mouth daily.    Marland Kitchen ibuprofen (ADVIL,MOTRIN) 200 MG tablet Take 200 mg by mouth every 6 (six) hours as needed.    Marland Kitchen levothyroxine (SYNTHROID, LEVOTHROID) 137 MCG tablet Take 137 mcg by mouth daily before breakfast.    . losartan (COZAAR) 100 MG tablet     . Multiple Vitamin (MULTIVITAMIN) capsule Take 1 capsule by mouth daily.    . psyllium (METAMUCIL) 58.6 % packet Take 1 packet by mouth daily.    . rosuvastatin (CRESTOR) 20 MG tablet Take 1 tablet (20 mg total) by mouth  daily. 30 tablet 6  . Saw Palmetto 450 MG CAPS Take 1 capsule by mouth daily.    . vitamin B-12 (CYANOCOBALAMIN) 1000 MCG tablet Take 1,000 mcg by mouth daily.     No current facility-administered medications for this visit.     REVIEW OF SYSTEMS:  [X]  denotes positive finding, [ ]  denotes negative finding Cardiac  Comments:  Chest pain or chest pressure:    Shortness of breath upon exertion:    Short of breath when lying flat:    Irregular heart rhythm:        Vascular    Pain in calf, thigh, or hip brought on by ambulation:    Pain in feet at night that wakes you up from your sleep:     Blood clot in your veins:    Leg swelling:           PHYSICAL EXAM: Vitals:   12/25/17 0940  BP: 135/77  Pulse: 63  Resp: 20  SpO2: 96%  Weight: 185 lb 6.4 oz (84.1 kg)  Height: 6\' 1"  (1.854 m)    GENERAL: The patient is a well-nourished male, in no acute distress. The vital signs are documented above. CARDIOVASCULAR: 2+ radial 2+ femoral 2+ popliteal and 2+ dorsalis pedis pulses with no evidence of peripheral artery aneurysms.  He does have a palpable aortic pulsation with no tenderness. PULMONARY:  There is good air exchange  MUSCULOSKELETAL: There are no major deformities or cyanosis. NEUROLOGIC: No focal weakness or paresthesias are detected. SKIN: There are no ulcers or rashes noted. PSYCHIATRIC: The patient has a normal affect.  DATA:  Ultrasound today of his aorta shows a maximum diameter 3.9 cm which is unchanged from his study one year ago.  MEDICAL ISSUES: Stable infrarenal abdominal aortic aneurysm with no evidence of growth.  He does have known diffuse arteriomegaly.  He was reassured to continue his usual activity without limitation.  He will see our nurse practitioner in 1 year for a repeat duplex follow-up    Larina Earthlyodd F. Early, MD [facility] Office Tel [phone] Pager [phone]

## 2018-01-03 DIAGNOSIS — Z79899 Other long term (current) drug therapy: Secondary | ICD-10-CM | POA: Diagnosis not present

## 2018-01-03 DIAGNOSIS — E785 Hyperlipidemia, unspecified: Secondary | ICD-10-CM | POA: Diagnosis not present

## 2018-01-03 LAB — LIPID PANEL
CHOL/HDL RATIO: 3.2 ratio (ref 0.0–5.0)
CHOLESTEROL TOTAL: 131 mg/dL (ref 100–199)
HDL: 41 mg/dL (ref >39)
LDL CALC: 75 mg/dL (ref 0–99)
TRIGLYCERIDES: 74 mg/dL (ref 0–149)
VLDL CHOLESTEROL CAL: 15 mg/dL (ref 5–40)

## 2018-01-03 LAB — HEPATIC FUNCTION PANEL
ALT: 22 IU/L (ref 0–44)
AST: 22 IU/L (ref 0–40)
Albumin: 4.3 g/dL (ref 3.5–4.7)
Alkaline Phosphatase: 57 IU/L (ref 39–117)
Bilirubin Total: 0.6 mg/dL (ref 0.0–1.2)
Bilirubin, Direct: 0.18 mg/dL (ref 0.00–0.40)
Total Protein: 6.7 g/dL (ref 6.0–8.5)

## 2018-01-04 ENCOUNTER — Encounter: Payer: Self-pay | Admitting: *Deleted

## 2018-01-14 DIAGNOSIS — M542 Cervicalgia: Secondary | ICD-10-CM | POA: Insufficient documentation

## 2018-01-14 DIAGNOSIS — H9113 Presbycusis, bilateral: Secondary | ICD-10-CM | POA: Insufficient documentation

## 2018-01-14 DIAGNOSIS — H9202 Otalgia, left ear: Secondary | ICD-10-CM | POA: Diagnosis not present

## 2018-01-23 DIAGNOSIS — E039 Hypothyroidism, unspecified: Secondary | ICD-10-CM | POA: Diagnosis not present

## 2018-03-25 DIAGNOSIS — E785 Hyperlipidemia, unspecified: Secondary | ICD-10-CM | POA: Diagnosis not present

## 2018-04-02 DIAGNOSIS — E7800 Pure hypercholesterolemia, unspecified: Secondary | ICD-10-CM | POA: Diagnosis not present

## 2018-04-02 DIAGNOSIS — E039 Hypothyroidism, unspecified: Secondary | ICD-10-CM | POA: Diagnosis not present

## 2018-04-02 DIAGNOSIS — R0982 Postnasal drip: Secondary | ICD-10-CM | POA: Diagnosis not present

## 2018-04-02 DIAGNOSIS — I10 Essential (primary) hypertension: Secondary | ICD-10-CM | POA: Diagnosis not present

## 2018-05-30 DIAGNOSIS — J310 Chronic rhinitis: Secondary | ICD-10-CM | POA: Diagnosis not present

## 2018-05-30 DIAGNOSIS — H6993 Unspecified Eustachian tube disorder, bilateral: Secondary | ICD-10-CM | POA: Diagnosis not present

## 2018-05-30 DIAGNOSIS — H9042 Sensorineural hearing loss, unilateral, left ear, with unrestricted hearing on the contralateral side: Secondary | ICD-10-CM | POA: Diagnosis not present

## 2018-07-02 ENCOUNTER — Other Ambulatory Visit: Payer: Self-pay | Admitting: Cardiology

## 2018-09-26 DIAGNOSIS — Z0000 Encounter for general adult medical examination without abnormal findings: Secondary | ICD-10-CM | POA: Diagnosis not present

## 2018-09-26 DIAGNOSIS — E559 Vitamin D deficiency, unspecified: Secondary | ICD-10-CM | POA: Diagnosis not present

## 2018-09-26 DIAGNOSIS — N400 Enlarged prostate without lower urinary tract symptoms: Secondary | ICD-10-CM | POA: Diagnosis not present

## 2018-09-26 DIAGNOSIS — N182 Chronic kidney disease, stage 2 (mild): Secondary | ICD-10-CM | POA: Diagnosis not present

## 2018-09-26 DIAGNOSIS — E785 Hyperlipidemia, unspecified: Secondary | ICD-10-CM | POA: Diagnosis not present

## 2018-09-26 DIAGNOSIS — E039 Hypothyroidism, unspecified: Secondary | ICD-10-CM | POA: Diagnosis not present

## 2018-09-26 DIAGNOSIS — M8580 Other specified disorders of bone density and structure, unspecified site: Secondary | ICD-10-CM | POA: Diagnosis not present

## 2018-10-03 DIAGNOSIS — E039 Hypothyroidism, unspecified: Secondary | ICD-10-CM | POA: Diagnosis not present

## 2018-10-03 DIAGNOSIS — I129 Hypertensive chronic kidney disease with stage 1 through stage 4 chronic kidney disease, or unspecified chronic kidney disease: Secondary | ICD-10-CM | POA: Diagnosis not present

## 2018-10-03 DIAGNOSIS — E785 Hyperlipidemia, unspecified: Secondary | ICD-10-CM | POA: Diagnosis not present

## 2018-10-03 DIAGNOSIS — Z0000 Encounter for general adult medical examination without abnormal findings: Secondary | ICD-10-CM | POA: Diagnosis not present

## 2018-10-03 DIAGNOSIS — N182 Chronic kidney disease, stage 2 (mild): Secondary | ICD-10-CM | POA: Diagnosis not present

## 2018-10-03 DIAGNOSIS — N400 Enlarged prostate without lower urinary tract symptoms: Secondary | ICD-10-CM | POA: Diagnosis not present

## 2018-10-30 DIAGNOSIS — M542 Cervicalgia: Secondary | ICD-10-CM | POA: Diagnosis not present

## 2018-10-30 DIAGNOSIS — M62838 Other muscle spasm: Secondary | ICD-10-CM | POA: Diagnosis not present

## 2018-10-30 DIAGNOSIS — M5412 Radiculopathy, cervical region: Secondary | ICD-10-CM | POA: Diagnosis not present

## 2018-11-05 DIAGNOSIS — M5412 Radiculopathy, cervical region: Secondary | ICD-10-CM | POA: Diagnosis not present

## 2018-11-05 DIAGNOSIS — M62838 Other muscle spasm: Secondary | ICD-10-CM | POA: Diagnosis not present

## 2018-11-05 DIAGNOSIS — M542 Cervicalgia: Secondary | ICD-10-CM | POA: Diagnosis not present

## 2018-11-26 DIAGNOSIS — M5412 Radiculopathy, cervical region: Secondary | ICD-10-CM | POA: Diagnosis not present

## 2018-11-26 DIAGNOSIS — M542 Cervicalgia: Secondary | ICD-10-CM | POA: Diagnosis not present

## 2018-11-26 DIAGNOSIS — M62838 Other muscle spasm: Secondary | ICD-10-CM | POA: Diagnosis not present

## 2018-12-03 NOTE — Progress Notes (Signed)
HPI: FU AAA.Nuclear study October 2015 showed ejection fraction 46% with left bundle branch block artifact and no ischemia. Abdominal ultrasound 2/19 showed 3.9 cm abdominal aortic aneurysm. Also with bilateral common iliac artery aneurysms. Followed by vascular surgery. Since last seen  he has some dizziness with standing.  He denies chest pain, dyspnea or syncope.  Current Outpatient Medications  Medication Sig Dispense Refill  . aspirin 81 MG tablet Take 81 mg by mouth daily.    . Glucosamine-Chondroit-Vit C-Mn (GLUCOSAMINE 1500 COMPLEX PO) Take 1,500 mg by mouth daily.    . hydrochlorothiazide (MICROZIDE) 12.5 MG capsule Take 12.5 mg by mouth daily.    Marland Kitchen ibuprofen (ADVIL,MOTRIN) 200 MG tablet Take 200 mg by mouth every 6 (six) hours as needed.    Marland Kitchen levothyroxine (SYNTHROID, LEVOTHROID) 137 MCG tablet Take 137 mcg by mouth daily before breakfast.    . losartan (COZAAR) 100 MG tablet     . Multiple Vitamin (MULTIVITAMIN) capsule Take 1 capsule by mouth daily.    . psyllium (METAMUCIL) 58.6 % packet Take 1 packet by mouth daily.    . rosuvastatin (CRESTOR) 20 MG tablet TAKE 1 TABLET BY MOUTH DAILY 30 tablet 6  . Saw Palmetto 450 MG CAPS Take 1 capsule by mouth daily.    . vitamin B-12 (CYANOCOBALAMIN) 1000 MCG tablet Take 1,000 mcg by mouth daily.     No current facility-administered medications for this visit.      Past Medical History:  Diagnosis Date  . AAA (abdominal aortic aneurysm) (HCC)   . Hyperlipidemia   . Hypertension   . Hypothyroid   . Nephrolithiasis   . PUD (peptic ulcer disease)     Past Surgical History:  Procedure Laterality Date  . CATARACT EXTRACTION, BILATERAL    . INGUINAL HERNIA REPAIR    . spine cyst    . TONSILLECTOMY      Social History   Socioeconomic History  . Marital status: Single    Spouse name: Not on file  . Number of children: 3  . Years of education: Not on file  . Highest education level: Not on file  Occupational History    . Not on file  Social Needs  . Financial resource strain: Not on file  . Food insecurity:    Worry: Not on file    Inability: Not on file  . Transportation needs:    Medical: Not on file    Non-medical: Not on file  Tobacco Use  . Smoking status: Former Games developer  . Smokeless tobacco: Former Neurosurgeon    Quit date: 11/21/1975  Substance and Sexual Activity  . Alcohol use: Yes    Comment: Occasional  . Drug use: No  . Sexual activity: Not on file  Lifestyle  . Physical activity:    Days per week: Not on file    Minutes per session: Not on file  . Stress: Not on file  Relationships  . Social connections:    Talks on phone: Not on file    Gets together: Not on file    Attends religious service: Not on file    Active member of club or organization: Not on file    Attends meetings of clubs or organizations: Not on file    Relationship status: Not on file  . Intimate partner violence:    Fear of current or ex partner: Not on file    Emotionally abused: Not on file    Physically abused: Not on  file    Forced sexual activity: Not on file  Other Topics Concern  . Not on file  Social History Narrative  . Not on file    Family History  Problem Relation Age of Onset  . AAA (abdominal aortic aneurysm) Father   . AAA (abdominal aortic aneurysm) Brother     ROS: no fevers or chills, productive cough, hemoptysis, dysphasia, odynophagia, melena, hematochezia, dysuria, hematuria, rash, seizure activity, orthopnea, PND, pedal edema, claudication. Remaining systems are negative.  Physical Exam: Well-developed well-nourished in no acute distress.  Skin is warm and dry.  HEENT is normal.  Neck is supple.  Chest is clear to auscultation with normal expansion.  Cardiovascular exam is regular rate and rhythm.  Abdominal exam nontender or distended. No masses palpated. Extremities show no edema. neuro grossly intact  ECG-normal sinus rhythm, left bundle branch block.  Personally  reviewed  A/P  1 abdominal aortic aneurysm-he will need follow-up ultrasound with vascular surgery February 2020.  2 hypertension-patient's blood pressure is controlled but he does have dizziness with standing at times.  Discontinue hydrochlorothiazide and follow.  3 hyperlipidemia-continue statin.  Check lipids and liver.  4 abnormal nuclear study-previous nuclear study showed possible mildly reduced LV function.  Perfusion showed probable left bundle branch artifact but no ischemia.  I will check an echocardiogram for LV function.  Olga Millers, MD

## 2018-12-09 ENCOUNTER — Encounter (INDEPENDENT_AMBULATORY_CARE_PROVIDER_SITE_OTHER): Payer: Self-pay

## 2018-12-09 ENCOUNTER — Ambulatory Visit (INDEPENDENT_AMBULATORY_CARE_PROVIDER_SITE_OTHER): Payer: PPO | Admitting: Cardiology

## 2018-12-09 ENCOUNTER — Encounter: Payer: Self-pay | Admitting: Cardiology

## 2018-12-09 VITALS — BP 126/68 | HR 67 | Ht 73.0 in | Wt 189.6 lb

## 2018-12-09 DIAGNOSIS — I714 Abdominal aortic aneurysm, without rupture, unspecified: Secondary | ICD-10-CM

## 2018-12-09 DIAGNOSIS — I429 Cardiomyopathy, unspecified: Secondary | ICD-10-CM | POA: Diagnosis not present

## 2018-12-09 DIAGNOSIS — E7800 Pure hypercholesterolemia, unspecified: Secondary | ICD-10-CM | POA: Diagnosis not present

## 2018-12-09 DIAGNOSIS — I10 Essential (primary) hypertension: Secondary | ICD-10-CM | POA: Diagnosis not present

## 2018-12-09 NOTE — Patient Instructions (Signed)
Medication Instructions:  STOP HCTZ If you need a refill on your cardiac medications before your next appointment, please call your pharmacy.   Lab work: Your physician recommends that you return for lab work PRIOR TO EATING If you have labs (blood work) drawn today and your tests are completely normal, you will receive your results only by: Marland Kitchen MyChart Message (if you have MyChart) OR . A paper copy in the mail If you have any lab test that is abnormal or we need to change your treatment, we will call you to review the results.  Testing/Procedures: Your physician has requested that you have an echocardiogram. Echocardiography is a painless test that uses sound waves to create images of your heart. It provides your doctor with information about the size and shape of your heart and how well your heart's chambers and valves are working. This procedure takes approximately one hour. There are no restrictions for this procedure.  [address]  Follow-Up: At [facility], you and your health needs are our priority.  As part of our continuing mission to provide you with exceptional heart care, we have created designated Provider Care Teams.  These Care Teams include your primary Cardiologist (physician) and Advanced Practice Providers (APPs -  Physician Assistants and Nurse Practitioners) who all work together to provide you with the care you need, when you need it. You will need a follow up appointment in 12 months.  Please call our office 2 months in advance to schedule this appointment.  You may see Olga Millers, MD or one of the following Advanced Practice Providers on your designated Care Team:   Corine Shelter, PA-C Judy Pimple, [place] . Marjie Skiff, [place]  CALL IN November TO SCHEDULE APPOINTMENT IN January 2021

## 2018-12-13 ENCOUNTER — Other Ambulatory Visit: Payer: Self-pay

## 2018-12-13 ENCOUNTER — Telehealth: Payer: Self-pay | Admitting: *Deleted

## 2018-12-13 ENCOUNTER — Ambulatory Visit (HOSPITAL_COMMUNITY): Payer: PPO | Attending: Cardiovascular Disease

## 2018-12-13 DIAGNOSIS — I429 Cardiomyopathy, unspecified: Secondary | ICD-10-CM | POA: Diagnosis not present

## 2018-12-13 DIAGNOSIS — Z87891 Personal history of nicotine dependence: Secondary | ICD-10-CM | POA: Insufficient documentation

## 2018-12-13 DIAGNOSIS — I119 Hypertensive heart disease without heart failure: Secondary | ICD-10-CM | POA: Insufficient documentation

## 2018-12-13 DIAGNOSIS — E785 Hyperlipidemia, unspecified: Secondary | ICD-10-CM | POA: Insufficient documentation

## 2018-12-13 DIAGNOSIS — I071 Rheumatic tricuspid insufficiency: Secondary | ICD-10-CM | POA: Insufficient documentation

## 2018-12-13 DIAGNOSIS — I77810 Thoracic aortic ectasia: Secondary | ICD-10-CM | POA: Insufficient documentation

## 2018-12-13 MED ORDER — METOPROLOL SUCCINATE ER 25 MG PO TB24: 25.0000 mg | ORAL_TABLET | Freq: Every day | ORAL | 3 refills | Status: DC

## 2018-12-13 NOTE — Telephone Encounter (Signed)
pt aware of results  New script sent to the pharmacy  

## 2018-12-13 NOTE — Telephone Encounter (Signed)
-----   Message from Lewayne Bunting, MD sent at 12/13/2018  3:47 PM EST ----- LV function reduced but similar to previous; add toprol 25 mg daily; continue ARB Olga Millers

## 2018-12-23 DIAGNOSIS — E039 Hypothyroidism, unspecified: Secondary | ICD-10-CM | POA: Diagnosis not present

## 2018-12-31 DIAGNOSIS — M5412 Radiculopathy, cervical region: Secondary | ICD-10-CM | POA: Diagnosis not present

## 2018-12-31 DIAGNOSIS — M62838 Other muscle spasm: Secondary | ICD-10-CM | POA: Diagnosis not present

## 2018-12-31 DIAGNOSIS — M542 Cervicalgia: Secondary | ICD-10-CM | POA: Diagnosis not present

## 2019-01-02 DIAGNOSIS — J309 Allergic rhinitis, unspecified: Secondary | ICD-10-CM | POA: Diagnosis not present

## 2019-01-02 DIAGNOSIS — I714 Abdominal aortic aneurysm, without rupture: Secondary | ICD-10-CM | POA: Diagnosis not present

## 2019-01-02 DIAGNOSIS — R911 Solitary pulmonary nodule: Secondary | ICD-10-CM | POA: Diagnosis not present

## 2019-01-02 DIAGNOSIS — E039 Hypothyroidism, unspecified: Secondary | ICD-10-CM | POA: Diagnosis not present

## 2019-01-02 DIAGNOSIS — I10 Essential (primary) hypertension: Secondary | ICD-10-CM | POA: Diagnosis not present

## 2019-01-02 DIAGNOSIS — E785 Hyperlipidemia, unspecified: Secondary | ICD-10-CM | POA: Diagnosis not present

## 2019-01-08 DIAGNOSIS — M542 Cervicalgia: Secondary | ICD-10-CM | POA: Diagnosis not present

## 2019-01-08 DIAGNOSIS — M5412 Radiculopathy, cervical region: Secondary | ICD-10-CM | POA: Diagnosis not present

## 2019-01-08 DIAGNOSIS — M62838 Other muscle spasm: Secondary | ICD-10-CM | POA: Diagnosis not present

## 2019-01-10 DIAGNOSIS — M542 Cervicalgia: Secondary | ICD-10-CM | POA: Diagnosis not present

## 2019-01-10 DIAGNOSIS — R200 Anesthesia of skin: Secondary | ICD-10-CM | POA: Diagnosis not present

## 2019-01-15 ENCOUNTER — Encounter: Payer: Self-pay | Admitting: *Deleted

## 2019-01-20 ENCOUNTER — Encounter: Payer: Self-pay | Admitting: Physician Assistant

## 2019-01-20 ENCOUNTER — Ambulatory Visit (HOSPITAL_COMMUNITY)
Admission: RE | Admit: 2019-01-20 | Discharge: 2019-01-20 | Disposition: A | Discharge status: Home / Self Care | Payer: PPO | Source: Ambulatory Visit | Attending: Surgery | Admitting: Surgery

## 2019-01-20 ENCOUNTER — Ambulatory Visit (INDEPENDENT_AMBULATORY_CARE_PROVIDER_SITE_OTHER): Payer: PPO | Admitting: Physician Assistant

## 2019-01-20 ENCOUNTER — Other Ambulatory Visit: Payer: Self-pay

## 2019-01-20 DIAGNOSIS — I714 Abdominal aortic aneurysm, without rupture, unspecified: Secondary | ICD-10-CM

## 2019-01-20 NOTE — Progress Notes (Signed)
    Established Abdominal Aortic Aneurysm   History of Present Illness   Brandon Valdez is a 85 y.o. (01/13/1934) male who presents with chief complaint: follow up for AAA.  Previous duplex 1 year ago demonstrated an AAA, measuring 3.9 cm.  The patient denies new or changing abd or back pain..  The patient is a former smoker.  Patient's father passed away due to a ruptured aneurysm.  He follows regularly with Dr. Jens Som for cardiomyopathy as well as HTN.  He is taking an aspirin and statin daily.  The patient's PMH, PSH, SH, and FamHx were reviewed and are unchanged from prior visit.  Current Outpatient Medications  Medication Sig Dispense Refill  . aspirin 81 MG tablet Take 81 mg by mouth daily.    . Glucosamine-Chondroit-Vit C-Mn (GLUCOSAMINE 1500 COMPLEX PO) Take 1,500 mg by mouth daily.    Marland Kitchen ibuprofen (ADVIL,MOTRIN) 200 MG tablet Take 200 mg by mouth every 6 (six) hours as needed.    Marland Kitchen levothyroxine (SYNTHROID, LEVOTHROID) 137 MCG tablet Take 137 mcg by mouth daily before breakfast.    . losartan (COZAAR) 100 MG tablet     . metoprolol succinate (TOPROL XL) 25 MG 24 hr tablet Take 1 tablet (25 mg total) by mouth at bedtime. 90 tablet 3  . Multiple Vitamin (MULTIVITAMIN) capsule Take 1 capsule by mouth daily.    . psyllium (METAMUCIL) 58.6 % packet Take 1 packet by mouth daily.    . rosuvastatin (CRESTOR) 20 MG tablet TAKE 1 TABLET BY MOUTH DAILY 30 tablet 6  . Saw Palmetto 450 MG CAPS Take 1 capsule by mouth daily.    . vitamin B-12 (CYANOCOBALAMIN) 1000 MCG tablet Take 1,000 mcg by mouth daily.     No current facility-administered medications for this visit.     On ROS today: 10 system ROS negative unless otherwise noted   Physical Examination   Vitals:   01/20/19 1001  BP: 135/80  Pulse: (!) 58  Resp: 16  Temp: (!) 97.5 F (36.4 C)  TempSrc: Oral  SpO2: 98%  Weight: 188 lb (85.3 kg)  Height: 6\' 1"  (1.854 m)   Body mass index is 24.8 kg/m.  General Alert, O x  3, WD, NAD  Pulmonary Sym exp, good B air movt  Cardiac RRR, Nl S1, S2  Vascular Vessel Right Left  Radial Palpable Palpable  Aorta Not palpable N/A  Femoral Palpable Palpable  Popliteal Not palpable Not palpable    Gastro- intestinal soft, non-distended, non-tender to palpation,  No palpable prominent aortic pulse  Musculo- skeletal M/S 5/5 throughout  , Extremities without ischemic changes  , No edema present,   Neurologic A&O; CN grossly intact     Non-Invasive Vascular Imaging   AAA Duplex (01/20/19)  Current size: 4.2 cm  Previous size: 3.9 cm  R CIA: 2.3 cm  L CIA: 2.1 cm   Medical Decision Making   Brandon Valdez is a 85 y.o. (1934/01/03) male who presents with: asymptomatic AAA measuring 4.2cm   AAA duplex essentially unchanged; AAA measuring 4.2cm  Recheck AAA duplex 1 year  Continue aspirin and statin daily  Follow regularly with Dr. Jens Som for HTN control   Emilie Rutter PA-C [facility] of [place] Office: [phone]  Clinic MD: Dr. Myra Gianotti

## 2019-01-22 ENCOUNTER — Other Ambulatory Visit (HOSPITAL_COMMUNITY): Payer: PPO

## 2019-01-22 DIAGNOSIS — M79642 Pain in left hand: Secondary | ICD-10-CM | POA: Diagnosis not present

## 2019-01-22 DIAGNOSIS — M79641 Pain in right hand: Secondary | ICD-10-CM | POA: Insufficient documentation

## 2019-01-28 DIAGNOSIS — M5030 Other cervical disc degeneration, unspecified cervical region: Secondary | ICD-10-CM | POA: Diagnosis not present

## 2019-01-28 DIAGNOSIS — E7800 Pure hypercholesterolemia, unspecified: Secondary | ICD-10-CM | POA: Diagnosis not present

## 2019-01-28 DIAGNOSIS — M542 Cervicalgia: Secondary | ICD-10-CM | POA: Diagnosis not present

## 2019-01-29 ENCOUNTER — Encounter: Payer: Self-pay | Admitting: *Deleted

## 2019-01-29 LAB — HEPATIC FUNCTION PANEL
ALT: 19 IU/L (ref 0–44)
AST: 24 IU/L (ref 0–40)
Albumin: 4.2 g/dL (ref 3.6–4.6)
Alkaline Phosphatase: 58 IU/L (ref 39–117)
BILIRUBIN TOTAL: 0.4 mg/dL (ref 0.0–1.2)
Bilirubin, Direct: 0.13 mg/dL (ref 0.00–0.40)
Total Protein: 6.4 g/dL (ref 6.0–8.5)

## 2019-01-29 LAB — LIPID PANEL
CHOLESTEROL TOTAL: 138 mg/dL (ref 100–199)
Chol/HDL Ratio: 3.6 ratio (ref 0.0–5.0)
HDL: 38 mg/dL — AB (ref >39)
LDL CALC: 75 mg/dL (ref 0–99)
TRIGLYCERIDES: 124 mg/dL (ref 0–149)
VLDL Cholesterol Cal: 25 mg/dL (ref 5–40)

## 2019-02-12 ENCOUNTER — Other Ambulatory Visit: Payer: Self-pay | Admitting: Cardiology

## 2019-03-06 ENCOUNTER — Other Ambulatory Visit: Payer: Self-pay | Admitting: Internal Medicine

## 2019-03-06 DIAGNOSIS — R911 Solitary pulmonary nodule: Secondary | ICD-10-CM

## 2019-04-07 DIAGNOSIS — G5603 Carpal tunnel syndrome, bilateral upper limbs: Secondary | ICD-10-CM | POA: Diagnosis not present

## 2019-04-09 DIAGNOSIS — G5603 Carpal tunnel syndrome, bilateral upper limbs: Secondary | ICD-10-CM | POA: Diagnosis not present

## 2019-04-09 DIAGNOSIS — M79642 Pain in left hand: Secondary | ICD-10-CM | POA: Diagnosis not present

## 2019-04-09 DIAGNOSIS — M79641 Pain in right hand: Secondary | ICD-10-CM | POA: Diagnosis not present

## 2019-05-01 DIAGNOSIS — G5603 Carpal tunnel syndrome, bilateral upper limbs: Secondary | ICD-10-CM | POA: Insufficient documentation

## 2019-05-01 DIAGNOSIS — G5601 Carpal tunnel syndrome, right upper limb: Secondary | ICD-10-CM | POA: Diagnosis not present

## 2019-05-09 DIAGNOSIS — Z4789 Encounter for other orthopedic aftercare: Secondary | ICD-10-CM | POA: Diagnosis not present

## 2019-05-09 DIAGNOSIS — G5601 Carpal tunnel syndrome, right upper limb: Secondary | ICD-10-CM | POA: Diagnosis not present

## 2019-05-09 DIAGNOSIS — G5603 Carpal tunnel syndrome, bilateral upper limbs: Secondary | ICD-10-CM | POA: Diagnosis not present

## 2019-06-02 DIAGNOSIS — I739 Peripheral vascular disease, unspecified: Secondary | ICD-10-CM | POA: Insufficient documentation

## 2019-06-24 DIAGNOSIS — E785 Hyperlipidemia, unspecified: Secondary | ICD-10-CM | POA: Diagnosis not present

## 2019-06-24 DIAGNOSIS — E039 Hypothyroidism, unspecified: Secondary | ICD-10-CM | POA: Diagnosis not present

## 2019-07-15 DIAGNOSIS — E039 Hypothyroidism, unspecified: Secondary | ICD-10-CM | POA: Diagnosis not present

## 2019-07-15 DIAGNOSIS — I714 Abdominal aortic aneurysm, without rupture: Secondary | ICD-10-CM | POA: Diagnosis not present

## 2019-07-15 DIAGNOSIS — E785 Hyperlipidemia, unspecified: Secondary | ICD-10-CM | POA: Diagnosis not present

## 2019-07-15 DIAGNOSIS — F17211 Nicotine dependence, cigarettes, in remission: Secondary | ICD-10-CM | POA: Diagnosis not present

## 2019-07-15 DIAGNOSIS — R911 Solitary pulmonary nodule: Secondary | ICD-10-CM | POA: Diagnosis not present

## 2019-07-15 DIAGNOSIS — I10 Essential (primary) hypertension: Secondary | ICD-10-CM | POA: Diagnosis not present

## 2019-07-15 DIAGNOSIS — Z23 Encounter for immunization: Secondary | ICD-10-CM | POA: Diagnosis not present

## 2019-08-07 ENCOUNTER — Other Ambulatory Visit: Payer: Self-pay | Admitting: Internal Medicine

## 2019-08-07 DIAGNOSIS — R911 Solitary pulmonary nodule: Secondary | ICD-10-CM

## 2019-08-11 ENCOUNTER — Other Ambulatory Visit: Payer: Self-pay

## 2019-08-11 DIAGNOSIS — I739 Peripheral vascular disease, unspecified: Secondary | ICD-10-CM

## 2019-08-12 ENCOUNTER — Ambulatory Visit: Payer: PPO | Admitting: Vascular Surgery

## 2019-08-12 ENCOUNTER — Ambulatory Visit (HOSPITAL_COMMUNITY): Payer: PPO | Attending: Internal Medicine

## 2019-08-14 ENCOUNTER — Encounter: Payer: Self-pay | Admitting: Family

## 2019-08-14 ENCOUNTER — Ambulatory Visit
Admission: RE | Admit: 2019-08-14 | Discharge: 2019-08-14 | Disposition: A | Discharge status: Home / Self Care | Payer: PPO | Source: Ambulatory Visit | Attending: Internal Medicine | Admitting: Internal Medicine

## 2019-08-14 DIAGNOSIS — R911 Solitary pulmonary nodule: Secondary | ICD-10-CM | POA: Diagnosis not present

## 2019-08-19 DIAGNOSIS — Z7189 Other specified counseling: Secondary | ICD-10-CM | POA: Diagnosis not present

## 2019-08-19 DIAGNOSIS — Z20828 Contact with and (suspected) exposure to other viral communicable diseases: Secondary | ICD-10-CM | POA: Diagnosis not present

## 2019-09-12 ENCOUNTER — Encounter (INDEPENDENT_AMBULATORY_CARE_PROVIDER_SITE_OTHER): Payer: Self-pay

## 2019-10-21 ENCOUNTER — Telehealth: Payer: Self-pay | Admitting: Cardiology

## 2019-10-21 NOTE — Telephone Encounter (Signed)
Patient went to the pharmacy today and needed clarification on what medications he needed to take

## 2019-10-21 NOTE — Telephone Encounter (Signed)
Spoke with pt pharmacy, medication list as of 11-2018 discussed.

## 2019-10-24 ENCOUNTER — Other Ambulatory Visit: Payer: Self-pay

## 2019-10-24 DIAGNOSIS — I739 Peripheral vascular disease, unspecified: Secondary | ICD-10-CM

## 2019-10-28 ENCOUNTER — Ambulatory Visit: Payer: PPO | Admitting: Vascular Surgery

## 2019-10-28 ENCOUNTER — Ambulatory Visit (HOSPITAL_COMMUNITY)
Admission: RE | Admit: 2019-10-28 | Discharge: 2019-10-28 | Disposition: A | Discharge status: Home / Self Care | Payer: PPO | Source: Ambulatory Visit | Attending: Family | Admitting: Family

## 2019-10-28 ENCOUNTER — Encounter: Payer: Self-pay | Admitting: Vascular Surgery

## 2019-10-28 ENCOUNTER — Other Ambulatory Visit: Payer: Self-pay

## 2019-10-28 VITALS — BP 156/85 | HR 71 | Temp 98.2°F | Resp 20 | Ht 73.0 in | Wt 187.0 lb

## 2019-10-28 DIAGNOSIS — I739 Peripheral vascular disease, unspecified: Secondary | ICD-10-CM

## 2019-10-28 DIAGNOSIS — I714 Abdominal aortic aneurysm, without rupture, unspecified: Secondary | ICD-10-CM

## 2019-10-28 NOTE — Progress Notes (Signed)
Vascular and Vein Specialist of [facility]  Patient name: Brandon Valdez MRN: [id number] DOB: 06-23-1934 Sex: male  REASON FOR VISIT: Evaluation lower extremity pain  HPI: Brandon Valdez is a 85 y.o. male here today for discussion of lower extremity discomfort.  He is known to our practice for follow-up of abdominal aortic aneurysm.  His last imaging was in March 2000 and his maximal diameter of his aorta was 4.2 cm.  He reports that he has been having leg discomfort.  He was on an active walking program prior to Covid.  Has not been back to the gym.  He reports no discomfort with walking.  His main complaint is that if he gets down on the floor that he has a difficult time to get back up.  Is difficult to discern exactly what he is sensing.  Does appear to be more musculoskeletal.  He is not having any low back pain or anything that would suggest disc disease as a cause for this.  Past Medical History:  Diagnosis Date  . AAA (abdominal aortic aneurysm) ([place])   . Hyperlipidemia   . Hypertension   . Hypothyroid   . Nephrolithiasis   . PUD (peptic ulcer disease)     Family History  Problem Relation Age of Onset  . AAA (abdominal aortic aneurysm) Father   . AAA (abdominal aortic aneurysm) Brother     SOCIAL HISTORY: Social History   Tobacco Use  . Smoking status: Former Research scientist (life sciences)  . Smokeless tobacco: Former Systems developer    Quit date: 11/21/1975  Substance Use Topics  . Alcohol use: Yes    Comment: Occasional    Allergies  Allergen Reactions  . Monopril [Fosinopril] Other (See Comments)    Unspecified   . Valium [Diazepam] Other (See Comments)    unspecified    Current Outpatient Medications  Medication Sig Dispense Refill  . amLODipine (NORVASC) 5 MG tablet Take 5 mg by mouth daily.    Marland Kitchen aspirin 81 MG tablet Take 81 mg by mouth daily.    Marland Kitchen gabapentin (NEURONTIN) 100 MG capsule gabapentin 100 mg capsule    . Glucosamine-Chondroit-Vit C-Mn  (GLUCOSAMINE 1500 COMPLEX PO) Take 1,500 mg by mouth daily.    Marland Kitchen ibuprofen (ADVIL,MOTRIN) 200 MG tablet Take 200 mg by mouth every 6 (six) hours as needed.    Marland Kitchen levothyroxine (SYNTHROID) 125 MCG tablet Take 125 mcg by mouth daily.    Marland Kitchen losartan (COZAAR) 100 MG tablet     . metoprolol succinate (TOPROL XL) 25 MG 24 hr tablet Take 1 tablet (25 mg total) by mouth at bedtime. 90 tablet 3  . Multiple Vitamin (MULTIVITAMIN) capsule Take 1 capsule by mouth daily.    . psyllium (METAMUCIL) 58.6 % packet Take 1 packet by mouth daily.    . rosuvastatin (CRESTOR) 20 MG tablet TAKE 1 TABLET BY MOUTH DAILY 30 tablet 10  . Saw Palmetto 450 MG CAPS Take 1 capsule by mouth daily.    . vitamin B-12 (CYANOCOBALAMIN) 1000 MCG tablet Take 1,000 mcg by mouth daily.     No current facility-administered medications for this visit.     REVIEW OF SYSTEMS:  [X]  denotes positive finding, [ ]  denotes negative finding Cardiac  Comments:  Chest pain or chest pressure:    Shortness of breath upon exertion:    Short of breath when lying flat:    Irregular heart rhythm:        Vascular    Pain in calf, thigh,  or hip brought on by ambulation:    Pain in feet at night that wakes you up from your sleep:     Blood clot in your veins:    Leg swelling:           PHYSICAL EXAM: Vitals:   10/28/19 1528  BP: (!) 156/85  Pulse: 71  Resp: 20  Temp: 98.2 F (36.8 C)  SpO2: 97%  Weight: 187 lb (84.8 kg)  Height: 6\' 1"  (1.854 m)    GENERAL: The patient is a well-nourished male, in no acute distress. The vital signs are documented above. CARDIOVASCULAR: Carotid arteries are without bruits bilaterally.  He does have a prominent aortic pulse with no tenderness.  He has 2+ femoral 3+ popliteal and 2+ dorsalis pedis pulses bilaterally PULMONARY: There is good air exchange  MUSCULOSKELETAL: There are no major deformities or cyanosis. NEUROLOGIC: No focal weakness or paresthesias are detected. SKIN: There are no ulcers or  rashes noted. PSYCHIATRIC: The patient has a normal affect.  DATA:  Lower extremity noninvasive studies reveal normal ankle arm index with no evidence of occlusive disease  MEDICAL ISSUES: I discussed these findings at length with the patient.  I do not see any evidence of arterial insufficiency as a cause of his symptoms.  We will see him again in 3 months with repeat ultrasound of his aorta as scheduled at one year interval from his last study.  He will continue follow-up with Dr. regarding his lower extremity symptoms    Selena Batten, MD [facility] Vascular and Vein Specialists of [facility] Tel [phone] Pager [phone]

## 2019-11-05 DIAGNOSIS — R490 Dysphonia: Secondary | ICD-10-CM | POA: Diagnosis not present

## 2019-11-05 DIAGNOSIS — R0689 Other abnormalities of breathing: Secondary | ICD-10-CM | POA: Diagnosis not present

## 2019-11-06 ENCOUNTER — Institutional Professional Consult (permissible substitution): Payer: PPO | Admitting: Internal Medicine

## 2019-11-12 ENCOUNTER — Other Ambulatory Visit: Payer: Self-pay

## 2019-11-12 ENCOUNTER — Encounter: Payer: Self-pay | Admitting: Internal Medicine

## 2019-11-12 ENCOUNTER — Ambulatory Visit (INDEPENDENT_AMBULATORY_CARE_PROVIDER_SITE_OTHER): Payer: PPO | Admitting: Internal Medicine

## 2019-11-12 VITALS — BP 152/82 | HR 72 | Temp 97.2°F | Ht 73.0 in | Wt 186.6 lb

## 2019-11-12 DIAGNOSIS — Z87891 Personal history of nicotine dependence: Secondary | ICD-10-CM

## 2019-11-12 DIAGNOSIS — F419 Anxiety disorder, unspecified: Secondary | ICD-10-CM | POA: Diagnosis not present

## 2019-11-12 DIAGNOSIS — R0602 Shortness of breath: Secondary | ICD-10-CM

## 2019-11-12 NOTE — Patient Instructions (Signed)
Please follow up with your family doctor about these episodes being related to stress, anxiety or depression.  I am happy to see you back if treatment of this does not help your episodes.

## 2019-11-12 NOTE — Progress Notes (Signed)
Brandon Valdez    [id number]    [birth year]  Primary Care Physician:Kim, Jeneen Rinks, MD  Referring Physician: Jani Gravel, MD [address] [place],  [place] [address] Reason for Consultation: shortness of breath Date of Consultation: 11/12/2019  Chief complaint:   Chief Complaint  Patient presents with  . Follow-up    Patient states at random he starts to gasp for air for about 30 seconds and then its gone and its fine. Has been happening off and on for 8 years but has been happening more recently. Denies any coughing or wheezing.     HPI: Episodes have been chronic - he remembers having one 40 years ago. They are getting more frequent over the ages.   The last one happened while walking to his daughters house, he had sudden onset Episode of gasping for air and feels like he can't get air in or out. Lasted about 15 seconds.   He has no difficulty with regular exertion and activities.  In fact he walks regularly around the track. No coughing, chest pain, No recurrent pneumonia.  He has chronic rhinitis and takes nasal spray.   Happened about 4-5 times this year, previously would happen a lot less frequently. No relation to talking or eating.  His partner is with him and says he gets easily stressed out.  He feels like he's going to die when he has these episodes.   He has never had a panic attack before, but his son does have them.    The patient became very tearful during our encounter, when I asked if he could possibly have any stress or worry.  He actually became tearful and started sobbing, mentioning that he was the last number of his family live, and that his siblings and parents are all dead.  He repeatedly mentions that he is very scared about dying.  He does note that these symptoms have worsened over this last year, and that this year has been very stressful especially after they had Covid in September.  He feels very frustrated when he sees people  walking around without masks and this seems to aggravate his worry.    Social history:  Occupation: Previously worked as a Barrister's clerk.   Smoking history: Previous smoker, quit in the 1970s.  Social History   Occupational History  . Not on file  Tobacco Use  . Smoking status: Former Smoker    Packs/day: 1.00    Years: 45.00    Pack years: 45.00    Types: Cigarettes    Start date: 1952    Quit date: 1977    Years since quitting: 44.0  . Smokeless tobacco: Former Systems developer    Quit date: 11/21/1975  Substance and Sexual Activity  . Alcohol use: Yes    Comment: Occasional  . Drug use: No  . Sexual activity: Not on file    Relevant family history:  Family History  Problem Relation Age of Onset  . AAA (abdominal aortic aneurysm) Father   . AAA (abdominal aortic aneurysm) Brother   . Panic disorder Son     Past Medical History:  Diagnosis Date  . AAA (abdominal aortic aneurysm) ([facility])   . Hyperlipidemia   . Hypertension   . Hypothyroid   . Nephrolithiasis   . PUD (peptic ulcer disease)     Past Surgical History:  Procedure Laterality Date  . CATARACT EXTRACTION, BILATERAL    . INGUINAL HERNIA REPAIR    .  spine cyst    . TONSILLECTOMY       Review of systems: Review of Systems  Constitutional: Negative for chills, fever and weight loss.  HENT: Negative for congestion, sinus pain and sore throat.   Eyes: Negative for discharge and redness.  Respiratory: Negative for cough, hemoptysis, sputum production, shortness of breath and wheezing.   Cardiovascular: Negative for chest pain, palpitations and leg swelling.  Gastrointestinal: Negative for heartburn, nausea and vomiting.  Musculoskeletal: Negative for joint pain and myalgias.  Skin: Negative for rash.  Neurological: Negative for dizziness, tremors, focal weakness and headaches.  Endo/Heme/Allergies: Negative for environmental allergies.  Psychiatric/Behavioral: Positive for depression. Negative for suicidal  ideas. The patient is nervous/anxious.   All other systems reviewed and are negative.   Physical Exam: Blood pressure (!) 152/82, pulse 72, temperature (!) 97.2 F (36.2 C), temperature source Temporal, height 6\' 1"  (1.854 m), weight 186 lb 9.6 oz (84.6 kg), SpO2 97 %. Gen:      No acute distress Eyes: EOMI, sclera anicteric ENT:  no nasal polyps, mucus membranes moist Neck:     Supple, no thyromegaly Lungs:    No increased respiratory effort, symmetric chest wall excursion, clear to auscultation bilaterally, no wheezes or crackles CV:         Regular rate and rhythm; no murmurs, rubs, or gallops.  No pedal edema Abd:      + bowel sounds; soft, non-tender; no distension MSK: no acute synovitis of DIP or PIP joints, no mechanics hands.  Skin:      Warm and dry; no rashes Neuro: normal speech, no focal facial asymmetry Psych: alert and oriented x3, normal mood and affect  Data Reviewed: Imaging: I have personally reviewed the CT scan from September 2020.  No masses or nodules.  He has a couple of benign sub-4 mm nodules that have not grown in 2 years.  He has aortic valve calcifications.  There is no significant emphysema.  Assessment:  Panic disorder Anxiety and depression History of tobacco use disorder Pulmonary nodules  Plan/Recommendations: Mr. Hamblen episodes seem to be increasing in frequency with his worsening anxiety and depression.  While I think that pulmonary function testing could be performed, I think it is unlikely that we will capture an episode while he is doing a specific maneuver.  Even if normal, I still think he needs treatment for his underlying psychiatric disorder.  Provocative testing with ENT can also be done to evaluate for vocal cord or laryngeal obstruction issues, but since these are not provoked and triggered specific with specific triggers I think this is more likely to be a lower yield pursuit.  I would recommend he circle back with his family doctor  to discuss treatment of anxiety and depression pharmacologically, as well as seeing a therapist for cognitive behavioral therapy.  I do not feel he has any significant symptomatic smoking-related lung disease.  No further follow-up needed for these nodules.  Return to Care: Return if symptoms worsen or fail to improve.  Janace Litten, MD Pulmonary and Critical Care Medicine [facility] Office:[phone]  CC: Durel Salts, MD

## 2019-11-17 ENCOUNTER — Other Ambulatory Visit: Payer: Self-pay | Admitting: *Deleted

## 2019-11-17 DIAGNOSIS — I714 Abdominal aortic aneurysm, without rupture, unspecified: Secondary | ICD-10-CM

## 2019-12-08 DIAGNOSIS — R42 Dizziness and giddiness: Secondary | ICD-10-CM | POA: Diagnosis not present

## 2019-12-08 DIAGNOSIS — R110 Nausea: Secondary | ICD-10-CM | POA: Diagnosis not present

## 2019-12-22 DIAGNOSIS — F329 Major depressive disorder, single episode, unspecified: Secondary | ICD-10-CM | POA: Diagnosis not present

## 2019-12-23 DIAGNOSIS — E785 Hyperlipidemia, unspecified: Secondary | ICD-10-CM | POA: Diagnosis not present

## 2019-12-23 DIAGNOSIS — I129 Hypertensive chronic kidney disease with stage 1 through stage 4 chronic kidney disease, or unspecified chronic kidney disease: Secondary | ICD-10-CM | POA: Diagnosis not present

## 2019-12-23 DIAGNOSIS — I10 Essential (primary) hypertension: Secondary | ICD-10-CM | POA: Diagnosis not present

## 2019-12-23 DIAGNOSIS — N182 Chronic kidney disease, stage 2 (mild): Secondary | ICD-10-CM | POA: Diagnosis not present

## 2019-12-23 DIAGNOSIS — E039 Hypothyroidism, unspecified: Secondary | ICD-10-CM | POA: Diagnosis not present

## 2019-12-30 DIAGNOSIS — J309 Allergic rhinitis, unspecified: Secondary | ICD-10-CM | POA: Diagnosis not present

## 2019-12-30 DIAGNOSIS — J300 Vasomotor rhinitis: Secondary | ICD-10-CM | POA: Diagnosis not present

## 2019-12-30 DIAGNOSIS — I10 Essential (primary) hypertension: Secondary | ICD-10-CM | POA: Diagnosis not present

## 2019-12-30 DIAGNOSIS — E785 Hyperlipidemia, unspecified: Secondary | ICD-10-CM | POA: Diagnosis not present

## 2019-12-30 DIAGNOSIS — E039 Hypothyroidism, unspecified: Secondary | ICD-10-CM | POA: Diagnosis not present

## 2019-12-30 DIAGNOSIS — Z0000 Encounter for general adult medical examination without abnormal findings: Secondary | ICD-10-CM | POA: Diagnosis not present

## 2019-12-30 DIAGNOSIS — I714 Abdominal aortic aneurysm, without rupture: Secondary | ICD-10-CM | POA: Diagnosis not present

## 2019-12-30 DIAGNOSIS — R911 Solitary pulmonary nodule: Secondary | ICD-10-CM | POA: Diagnosis not present

## 2020-01-12 ENCOUNTER — Ambulatory Visit (INDEPENDENT_AMBULATORY_CARE_PROVIDER_SITE_OTHER): Payer: PPO | Admitting: Cardiology

## 2020-01-12 ENCOUNTER — Other Ambulatory Visit: Payer: Self-pay

## 2020-01-12 ENCOUNTER — Encounter: Payer: Self-pay | Admitting: Cardiology

## 2020-01-12 VITALS — BP 146/82 | HR 71 | Temp 97.3°F | Ht 73.0 in | Wt 193.0 lb

## 2020-01-12 DIAGNOSIS — I429 Cardiomyopathy, unspecified: Secondary | ICD-10-CM

## 2020-01-12 DIAGNOSIS — I10 Essential (primary) hypertension: Secondary | ICD-10-CM

## 2020-01-12 DIAGNOSIS — I714 Abdominal aortic aneurysm, without rupture, unspecified: Secondary | ICD-10-CM

## 2020-01-12 DIAGNOSIS — E7800 Pure hypercholesterolemia, unspecified: Secondary | ICD-10-CM | POA: Diagnosis not present

## 2020-01-12 MED ORDER — LOSARTAN POTASSIUM 25 MG PO TABS: 25.0000 mg | ORAL_TABLET | Freq: Every day | ORAL | 3 refills | Status: DC

## 2020-01-12 MED ORDER — METOPROLOL SUCCINATE ER 25 MG PO TB24: 25.0000 mg | ORAL_TABLET | Freq: Every day | ORAL | 3 refills | Status: DC

## 2020-01-12 NOTE — Patient Instructions (Signed)
Medication Instructions:  START METOPROLOL 25 MG ONCE DAILY  START LOSARTAN 25 MG ONCE DAILY  *If you need a refill on your cardiac medications before your next appointment, please call your pharmacy*  Lab Work: Your physician recommends that you return for lab work in: ONE WEEK  If you have labs (blood work) drawn today and your tests are completely normal, you will receive your results only by: Marland Kitchen MyChart Message (if you have MyChart) OR . A paper copy in the mail If you have any lab test that is abnormal or we need to change your treatment, we will call you to review the results.  Testing/Procedures: Your physician has requested that you have an abdominal aorta duplex. During this test, an ultrasound is used to evaluate the aorta. Allow 30 minutes for this exam. Do not eat after midnight the day before and avoid carbonated beverages SCHEDULE IN MARCH  Follow-Up: At [facility], you and your health needs are our priority.  As part of our continuing mission to provide you with exceptional heart care, we have created designated Provider Care Teams.  These Care Teams include your primary Cardiologist (physician) and Advanced Practice Providers (APPs -  Physician Assistants and Nurse Practitioners) who all work together to provide you with the care you need, when you need it.  Your next appointment:   6 month(s)  The format for your next appointment:   Either In Person or Virtual  Provider:   You may see Olga Millers, MD or one of the following Advanced Practice Providers on your designated Care Team:    Corine Shelter, PA-C  [place], [place]  Edd Fabian, [place]

## 2020-01-12 NOTE — Progress Notes (Signed)
HPI: FUAAA.Nuclear study October 2015 showed ejection fraction 46% with left bundle branch block artifact and no ischemia. Also with bilateral common iliac artery aneurysms. Followed by vascular surgery. Echocardiogram January 2020 showed ejection fraction 40 to 11%, grade 1 diastolic dysfunction. Abdominal ultrasound March 2020 showed 4 cm abdominal aortic aneurysm. Chest CT September 2020 showed aortic atherosclerosis and three-vessel coronary disease. Since last seenpatient denies dyspnea, chest pain, palpitations or syncope.  Current Outpatient Medications  Medication Sig Dispense Refill  . amLODipine (NORVASC) 5 MG tablet Take 5 mg by mouth daily.    Marland Kitchen aspirin 81 MG tablet Take 81 mg by mouth daily.    Marland Kitchen HYDROcodone-acetaminophen (NORCO/VICODIN) 5-325 MG tablet Take 1 tablet by mouth every 6 (six) hours as needed for moderate pain.    Marland Kitchen ibuprofen (ADVIL,MOTRIN) 200 MG tablet Take 200 mg by mouth every 6 (six) hours as needed.    Marland Kitchen levothyroxine (SYNTHROID) 125 MCG tablet Take 125 mcg by mouth daily.    . meclizine (ANTIVERT) 25 MG tablet Take 25 mg by mouth 3 (three) times daily as needed for dizziness.    . meloxicam (MOBIC) 15 MG tablet Take 15 mg by mouth daily.    . Multiple Vitamin (MULTIVITAMIN) capsule Take 1 capsule by mouth daily.    . psyllium (METAMUCIL) 58.6 % packet Take 1 packet by mouth daily.    . rosuvastatin (CRESTOR) 20 MG tablet TAKE 1 TABLET BY MOUTH DAILY 30 tablet 10  . Saw Palmetto 450 MG CAPS Take 1 capsule by mouth daily.    . vitamin B-12 (CYANOCOBALAMIN) 1000 MCG tablet Take 1,000 mcg by mouth daily.     No current facility-administered medications for this visit.     Past Medical History:  Diagnosis Date  . AAA (abdominal aortic aneurysm) ([place])   . Hyperlipidemia   . Hypertension   . Hypothyroid   . Nephrolithiasis   . PUD (peptic ulcer disease)     Past Surgical History:  Procedure Laterality Date  . CATARACT EXTRACTION, BILATERAL    .  INGUINAL HERNIA REPAIR    . spine cyst    . TONSILLECTOMY      Social History   Socioeconomic History  . Marital status: Single    Spouse name: Not on file  . Number of children: 3  . Years of education: Not on file  . Highest education level: Not on file  Occupational History  . Not on file  Tobacco Use  . Smoking status: Former Smoker    Packs/day: 1.00    Years: 45.00    Pack years: 45.00    Types: Cigarettes    Start date: 1950    Quit date: 1977    Years since quitting: 44.1  . Smokeless tobacco: Former Systems developer    Quit date: 11/21/1975  Substance and Sexual Activity  . Alcohol use: Yes    Comment: Occasional  . Drug use: No  . Sexual activity: Not on file  Other Topics Concern  . Not on file  Social History Narrative  . Not on file   Social Determinants of Health   Financial Resource Strain:   . Difficulty of Paying Living Expenses: Not on file  Food Insecurity:   . Worried About Charity fundraiser in the Last Year: Not on file  . Ran Out of Food in the Last Year: Not on file  Transportation Needs:   . Lack of Transportation (Medical): Not on file  . Lack  of Transportation (Non-Medical): Not on file  Physical Activity:   . Days of Exercise per Week: Not on file  . Minutes of Exercise per Session: Not on file  Stress:   . Feeling of Stress : Not on file  Social Connections:   . Frequency of Communication with Friends and Family: Not on file  . Frequency of Social Gatherings with Friends and Family: Not on file  . Attends Religious Services: Not on file  . Active Member of Clubs or Organizations: Not on file  . Attends Banker Meetings: Not on file  . Marital Status: Not on file  Intimate Partner Violence:   . Fear of Current or Ex-Partner: Not on file  . Emotionally Abused: Not on file  . Physically Abused: Not on file  . Sexually Abused: Not on file    Family History  Problem Relation Age of Onset  . AAA (abdominal aortic aneurysm)  Father   . AAA (abdominal aortic aneurysm) Brother   . Panic disorder Son     ROS: no fevers or chills, productive cough, hemoptysis, dysphasia, odynophagia, melena, hematochezia, dysuria, hematuria, rash, seizure activity, orthopnea, PND, pedal edema, claudication. Remaining systems are negative.  Physical Exam: Well-developed well-nourished in no acute distress.  Skin is warm and dry.  HEENT is normal.  Neck is supple.  Chest is clear to auscultation with normal expansion.  Cardiovascular exam is regular rate and rhythm.  Abdominal exam nontender or distended. No masses palpated. Extremities show no edema. neuro grossly intact  ECG-sinus rhythm at a rate of 71, left bundle branch block. Personally reviewed  A/P  1 abdominal aortic aneurysm-plan follow-up ultrasound March 2020.  2 mild cardiomyopathy-no symptoms.  Previous nuclear study showed no ischemia.  We will add Toprol 25 mg daily and losartan 25 mg daily (note this is listed as an allergy for cough; patient denies any history of angioedema)..  Check potassium and renal function in 1 week.  3 hypertension-blood pressure borderline.  Add low-dose Toprol and losartan for cardiomyopathy and blood pressure as outlined above.  4 hyperlipidemia-continue statin.   5 coronary calcification-plan to continue aspirin and statin.  Olga Millers, MD

## 2020-01-14 ENCOUNTER — Telehealth: Payer: Self-pay | Admitting: Cardiology

## 2020-01-14 NOTE — Telephone Encounter (Signed)
New Message  Received a phone call from [facility] and they have questions about a medication. Please call [phone] and ask for Tom.   Please call to discuss

## 2020-01-14 NOTE — Telephone Encounter (Signed)
Spoke with Brandon Valdez, Aware of dr Ludwig Clarks recommendations.

## 2020-01-14 NOTE — Telephone Encounter (Signed)
Continue amlodipine for now [facility]

## 2020-01-14 NOTE — Telephone Encounter (Signed)
Tom from [facility] calling stating that Dr. Jens Som started pt on 25mg  metoprolol and 25mg  losartan. He was wanting to know whether the pt still needs to take amlodipine. Will forward to MD for review.

## 2020-01-19 ENCOUNTER — Other Ambulatory Visit: Payer: Self-pay | Admitting: Cardiology

## 2020-01-26 ENCOUNTER — Ambulatory Visit (INDEPENDENT_AMBULATORY_CARE_PROVIDER_SITE_OTHER): Payer: PPO | Admitting: Physician Assistant

## 2020-01-26 ENCOUNTER — Other Ambulatory Visit: Payer: Self-pay

## 2020-01-26 ENCOUNTER — Ambulatory Visit (HOSPITAL_COMMUNITY)
Admission: RE | Admit: 2020-01-26 | Discharge: 2020-01-26 | Disposition: A | Discharge status: Home / Self Care | Payer: PPO | Source: Ambulatory Visit | Attending: Surgery | Admitting: Surgery

## 2020-01-26 VITALS — BP 153/77 | HR 54 | Temp 97.3°F | Resp 18 | Ht 73.0 in | Wt 189.0 lb

## 2020-01-26 DIAGNOSIS — I714 Abdominal aortic aneurysm, without rupture, unspecified: Secondary | ICD-10-CM

## 2020-01-26 NOTE — Progress Notes (Signed)
Office Note     CC:  follow up Requesting Provider:  Pearson Grippe, MD  HPI: Brandon Valdez is a 86 y.o. ([birth year]) male who presents for routine follow-up abdominal aortic aneurysm.  He denies abdominal or back pain.  He was seen on February 22 by Dr. Jens Som who told him he had a "weak heart".  He was started on metoprolol 25 mg daily and losartan 25 mg daily.  The patient states he has not resumed his normal walking for exercise as prior to the pandemic.  The pt is on a statin for cholesterol management.  The pt is on a daily aspirin.   Other AC: None The pt is on calcium channel blocker, ARB, beta-blocker for hypertension.   The pt is not diabetic.   Tobacco hx: Former cigarettes, quit 1977  Past Medical History:  Diagnosis Date  . AAA (abdominal aortic aneurysm) ([facility])   . Hyperlipidemia   . Hypertension   . Hypothyroid   . Nephrolithiasis   . PUD (peptic ulcer disease)     Past Surgical History:  Procedure Laterality Date  . CATARACT EXTRACTION, BILATERAL    . INGUINAL HERNIA REPAIR    . spine cyst    . TONSILLECTOMY      Social History   Socioeconomic History  . Marital status: Single    Spouse name: Not on file  . Number of children: 3  . Years of education: Not on file  . Highest education level: Not on file  Occupational History  . Not on file  Tobacco Use  . Smoking status: Former Smoker    Packs/day: 1.00    Years: 45.00    Pack years: 45.00    Types: Cigarettes    Start date: 1950    Quit date: 1977    Years since quitting: 44.2  . Smokeless tobacco: Former Neurosurgeon    Quit date: 11/21/1975  Substance and Sexual Activity  . Alcohol use: Yes    Comment: Occasional  . Drug use: No  . Sexual activity: Not on file  Other Topics Concern  . Not on file  Social History Narrative  . Not on file   Social Determinants of Health   Financial Resource Strain:   . Difficulty of Paying Living Expenses: Not on file  Food Insecurity:   . Worried About  Programme researcher, broadcasting/film/video in the Last Year: Not on file  . Ran Out of Food in the Last Year: Not on file  Transportation Needs:   . Lack of Transportation (Medical): Not on file  . Lack of Transportation (Non-Medical): Not on file  Physical Activity:   . Days of Exercise per Week: Not on file  . Minutes of Exercise per Session: Not on file  Stress:   . Feeling of Stress : Not on file  Social Connections:   . Frequency of Communication with Friends and Family: Not on file  . Frequency of Social Gatherings with Friends and Family: Not on file  . Attends Religious Services: Not on file  . Active Member of Clubs or Organizations: Not on file  . Attends Banker Meetings: Not on file  . Marital Status: Not on file  Intimate Partner Violence:   . Fear of Current or Ex-Partner: Not on file  . Emotionally Abused: Not on file  . Physically Abused: Not on file  . Sexually Abused: Not on file    Family History  Problem Relation Age of Onset  . AAA (  abdominal aortic aneurysm) Father   . AAA (abdominal aortic aneurysm) Brother   . Panic disorder Son     Current Outpatient Medications  Medication Sig Dispense Refill  . amLODipine (NORVASC) 5 MG tablet Take 5 mg by mouth daily.    Marland Kitchen aspirin 81 MG tablet Take 81 mg by mouth daily.    . hydrochlorothiazide (MICROZIDE) 12.5 MG capsule Take 12.5 mg by mouth daily.    Marland Kitchen ibuprofen (ADVIL,MOTRIN) 200 MG tablet Take 200 mg by mouth every 6 (six) hours as needed.    Marland Kitchen levothyroxine (SYNTHROID) 125 MCG tablet Take 125 mcg by mouth daily.    Marland Kitchen losartan (COZAAR) 25 MG tablet Take 1 tablet (25 mg total) by mouth daily. 90 tablet 3  . meclizine (ANTIVERT) 25 MG tablet Take 25 mg by mouth 3 (three) times daily as needed for dizziness.    . meloxicam (MOBIC) 15 MG tablet Take 15 mg by mouth daily.    . metoprolol succinate (TOPROL XL) 25 MG 24 hr tablet Take 1 tablet (25 mg total) by mouth daily. 90 tablet 3  . Multiple Vitamin (MULTIVITAMIN)  capsule Take 1 capsule by mouth daily.    . psyllium (METAMUCIL) 58.6 % packet Take 1 packet by mouth daily.    . rosuvastatin (CRESTOR) 20 MG tablet TAKE 1 TABLET BY MOUTH DAILY 30 tablet 10  . Saw Palmetto 450 MG CAPS Take 1 capsule by mouth daily.    . vitamin B-12 (CYANOCOBALAMIN) 1000 MCG tablet Take 1,000 mcg by mouth daily.     No current facility-administered medications for this visit.    Allergies  Allergen Reactions  . Monopril [Fosinopril] Other (See Comments)    Unspecified   . Valium [Diazepam] Other (See Comments)    unspecified     REVIEW OF SYSTEMS:   [X]  denotes positive finding, [ ]  denotes negative finding Cardiac  Comments:  Chest pain or chest pressure:    Shortness of breath upon exertion:    Short of breath when lying flat:    Irregular heart rhythm:        Vascular    Pain in calf, thigh, or hip brought on by ambulation:    Pain in feet at night that wakes you up from your sleep:     Blood clot in your veins:    Leg swelling:         Pulmonary    Oxygen at home:    Productive cough:     Wheezing:         Neurologic    Sudden weakness in arms or legs:     Sudden numbness in arms or legs:     Sudden onset of difficulty speaking or slurred speech:    Temporary loss of vision in one eye:     Problems with dizziness:         Gastrointestinal    Blood in stool:     Vomited blood:         Genitourinary    Burning when urinating:     Blood in urine:        Psychiatric    Major depression:         Hematologic    Bleeding problems:    Problems with blood clotting too easily:        Skin    Rashes or ulcers:        Constitutional    Fever or chills:      PHYSICAL EXAMINATION:  Vitals:   01/26/20 1009  BP: (!) 153/77  Pulse: (!) 54  Resp: 18  Temp: (!) 97.3 F (36.3 C)  TempSrc: Temporal  SpO2: 95%  Weight: 189 lb (85.7 kg)  Height: 6\' 1"  (1.854 m)    General:  WDWN in NAD; vital signs documented above Gait:, Normal,  unassisted HENT: WNL, normocephalic Pulmonary: normal non-labored breathing , without Rales, rhonchi,  wheezing Cardiac: regular HR, without  Murmurs without carotid bruits Abdomen: soft, NT, no masses.  Aorta is pulsatile Skin: without rashes Vascular Exam/Pulses: Extremities: without ischemic changes, without Gangrene , without cellulitis; without open wounds;  Musculoskeletal: no muscle wasting or atrophy  Neurologic: A&O X 3;  No focal weakness or paresthesias are detected Psychiatric:  The pt has Normal affect.  Pulse exam: Palpable 2+ bilateral radial, femoral, popliteal and posterior tibial pulses.  1+ bilateral dorsalis pedis pulses  Non-Invasive Vascular Imaging:   +-----------+-------+----------+----------+----------+--------+--------+  Location  AP (cm)Trans (cm)PSV (cm/s)Waveform ThrombusComments  +-----------+-------+----------+----------+----------+--------+--------+  Supraceliac2.52  2.35   62    biphasic           +-----------+-------+----------+----------+----------+--------+--------+  Proximal  2.59  2.48   66    biphasic           +-----------+-------+----------+----------+----------+--------+--------+  Mid    4.25  4.20   30    monophasicPresent fusiform  +-----------+-------+----------+----------+----------+--------+--------+  Distal   2.24       48    monophasic          +-----------+-------+----------+----------+----------+--------+--------+  RT CIA Prox2.2  2.1    38    monophasic    ectatic   +-----------+-------+----------+----------+----------+--------+--------+  LT CIA Prox2.0  2.1    39    monophasic    ectatic   +-----------+-------+----------+----------+----------+--------+--------+   Visualization of the Distal Abdominal Aorta was limited. Transverse veiw  of the distal aorta was obscured by bowel gas.    Abdominal Aorta: There is evidence of abnormal dilatation of the mid  Abdominal aorta. The largest aortic measurement is 4.2 cm. Ectatic  bilateral proximal common iliac arteries. The largest aortic diameter  remains essentially unchanged compared to prior  exam. Previous diameter measurement was 4.0 cm obtained on 01/20/2019.      ASSESSMENT/PLAN:: 86 y.o. male here for follow up for abdominal aortic aneurysm.  Small change in largest aortic measurement of 0.2 cm.  Asymptomatic.  Reviewed symptoms of significant persistent abdominal or back pain and to seek medical attention.  Continue medications, exercise and routine follow-up.  Follow-up 1 year  91, PA-C [facility] [phone]  Clinic MD:   [phone]

## 2020-01-27 ENCOUNTER — Other Ambulatory Visit: Payer: Self-pay | Admitting: *Deleted

## 2020-01-27 ENCOUNTER — Other Ambulatory Visit (HOSPITAL_COMMUNITY): Payer: PPO

## 2020-01-27 DIAGNOSIS — I714 Abdominal aortic aneurysm, without rupture, unspecified: Secondary | ICD-10-CM

## 2020-01-29 ENCOUNTER — Encounter: Payer: Self-pay | Admitting: *Deleted

## 2020-01-29 DIAGNOSIS — I429 Cardiomyopathy, unspecified: Secondary | ICD-10-CM | POA: Diagnosis not present

## 2020-01-29 LAB — BASIC METABOLIC PANEL
BUN/Creatinine Ratio: 13 (ref 10–24)
BUN: 11 mg/dL (ref 8–27)
CO2: 26 mmol/L (ref 20–29)
Calcium: 9.3 mg/dL (ref 8.6–10.2)
Chloride: 99 mmol/L (ref 96–106)
Creatinine, Ser: 0.87 mg/dL (ref 0.76–1.27)
GFR calc Af Amer: 92 mL/min/{1.73_m2} (ref >59)
GFR calc non Af Amer: 80 mL/min/{1.73_m2} (ref >59)
Glucose: 92 mg/dL (ref 65–99)
Potassium: 4.4 mmol/L (ref 3.5–5.2)
Sodium: 139 mmol/L (ref 134–144)

## 2020-03-25 DIAGNOSIS — J309 Allergic rhinitis, unspecified: Secondary | ICD-10-CM | POA: Diagnosis not present

## 2020-06-22 DIAGNOSIS — E785 Hyperlipidemia, unspecified: Secondary | ICD-10-CM | POA: Diagnosis not present

## 2020-06-29 DIAGNOSIS — I129 Hypertensive chronic kidney disease with stage 1 through stage 4 chronic kidney disease, or unspecified chronic kidney disease: Secondary | ICD-10-CM | POA: Diagnosis not present

## 2020-06-29 DIAGNOSIS — E785 Hyperlipidemia, unspecified: Secondary | ICD-10-CM | POA: Diagnosis not present

## 2020-06-29 DIAGNOSIS — E039 Hypothyroidism, unspecified: Secondary | ICD-10-CM | POA: Diagnosis not present

## 2020-06-29 DIAGNOSIS — F329 Major depressive disorder, single episode, unspecified: Secondary | ICD-10-CM | POA: Diagnosis not present

## 2020-07-23 NOTE — Progress Notes (Signed)
HPI: FUAAA.Nuclear study October 2015 showed ejection fraction 46% with left bundle branch block artifact and no ischemia. Echocardiogram January 2020 showed ejection fraction 40 to 45%, grade 1 diastolic dysfunction. Chest CT September 2020 showed aortic atherosclerosis and three-vessel coronary disease.  Abdominal ultrasound March 2021 showed 4.2 cm abdominal aortic aneurysm and ectatic bilateral proximal common iliac arteries.  Since last seenpatient denies dyspnea, chest pain, palpitations or syncope.  Current Outpatient Medications  Medication Sig Dispense Refill  . amLODipine (NORVASC) 5 MG tablet Take 5 mg by mouth daily.    Marland Kitchen aspirin 81 MG tablet Take 81 mg by mouth daily.    . hydrochlorothiazide (MICROZIDE) 12.5 MG capsule Take 12.5 mg by mouth daily.    Marland Kitchen ibuprofen (ADVIL,MOTRIN) 200 MG tablet Take 200 mg by mouth every 6 (six) hours as needed.    Marland Kitchen levothyroxine (SYNTHROID) 125 MCG tablet Take 125 mcg by mouth daily.    . meclizine (ANTIVERT) 25 MG tablet Take 25 mg by mouth 3 (three) times daily as needed for dizziness.    . meloxicam (MOBIC) 15 MG tablet Take 15 mg by mouth daily.    . metoprolol succinate (TOPROL XL) 25 MG 24 hr tablet Take 1 tablet (25 mg total) by mouth daily. 90 tablet 3  . Multiple Vitamin (MULTIVITAMIN) capsule Take 1 capsule by mouth daily.    . psyllium (METAMUCIL) 58.6 % packet Take 1 packet by mouth daily.    . rosuvastatin (CRESTOR) 20 MG tablet TAKE 1 TABLET BY MOUTH DAILY 30 tablet 10  . Saw Palmetto 450 MG CAPS Take 1 capsule by mouth daily.    . vitamin B-12 (CYANOCOBALAMIN) 1000 MCG tablet Take 1,000 mcg by mouth daily.    . fluticasone (FLONASE) 50 MCG/ACT nasal spray Place into both nostrils.    Marland Kitchen losartan (COZAAR) 25 MG tablet Take 1 tablet (25 mg total) by mouth daily. 90 tablet 3   No current facility-administered medications for this visit.     Past Medical History:  Diagnosis Date  . AAA (abdominal aortic aneurysm) (HCC)     . Hyperlipidemia   . Hypertension   . Hypothyroid   . Nephrolithiasis   . PUD (peptic ulcer disease)     Past Surgical History:  Procedure Laterality Date  . CATARACT EXTRACTION, BILATERAL    . INGUINAL HERNIA REPAIR    . spine cyst    . TONSILLECTOMY      Social History   Socioeconomic History  . Marital status: Single    Spouse name: Not on file  . Number of children: 3  . Years of education: Not on file  . Highest education level: Not on file  Occupational History  . Not on file  Tobacco Use  . Smoking status: Former Smoker    Packs/day: 1.00    Years: 45.00    Pack years: 45.00    Types: Cigarettes    Start date: 1952    Quit date: 1977    Years since quitting: 44.7  . Smokeless tobacco: Former Neurosurgeon    Quit date: 11/21/1975  Vaping Use  . Vaping Use: Never used  Substance and Sexual Activity  . Alcohol use: Yes    Comment: Occasional  . Drug use: No  . Sexual activity: Not on file  Other Topics Concern  . Not on file  Social History Narrative  . Not on file   Social Determinants of Health   Financial Resource Strain:   .  Difficulty of Paying Living Expenses: Not on file  Food Insecurity:   . Worried About Programme researcher, broadcasting/film/video in the Last Year: Not on file  . Ran Out of Food in the Last Year: Not on file  Transportation Needs:   . Lack of Transportation (Medical): Not on file  . Lack of Transportation (Non-Medical): Not on file  Physical Activity:   . Days of Exercise per Week: Not on file  . Minutes of Exercise per Session: Not on file  Stress:   . Feeling of Stress : Not on file  Social Connections:   . Frequency of Communication with Friends and Family: Not on file  . Frequency of Social Gatherings with Friends and Family: Not on file  . Attends Religious Services: Not on file  . Active Member of Clubs or Organizations: Not on file  . Attends Banker Meetings: Not on file  . Marital Status: Not on file  Intimate Partner Violence:    . Fear of Current or Ex-Partner: Not on file  . Emotionally Abused: Not on file  . Physically Abused: Not on file  . Sexually Abused: Not on file    Family History  Problem Relation Age of Onset  . AAA (abdominal aortic aneurysm) Father   . AAA (abdominal aortic aneurysm) Brother   . Panic disorder Son     ROS: Shoulder pain from recent fall but no fevers or chills, productive cough, hemoptysis, dysphasia, odynophagia, melena, hematochezia, dysuria, hematuria, rash, seizure activity, orthopnea, PND, pedal edema, claudication. Remaining systems are negative.  Physical Exam: Well-developed well-nourished in no acute distress.  Skin is warm and dry.  HEENT is normal.  Neck is supple.  Chest is clear to auscultation with normal expansion.  Cardiovascular exam is regular rate and rhythm.  Abdominal exam nontender or distended. No masses palpated. Extremities show no edema. neuro grossly intact   A/P  1 abdominal aortic aneurysm-plan follow-up ultrasound March 2022.  2 coronary calcification-plan to continue medical therapy with aspirin and statin.  3 mild cardiomyopathy-patient is unclear about his medications.  He will continue Toprol.  Unclear if he is taking losartan.  I would like for him to be on an ARB and we will review his medications and adjust based on home meds.  Once medications fully titrated we will repeat echocardiogram to reassess LV function.  4 hypertension-blood pressure controlled.  Continue present medications.  5 hyperlipidemia-continue statin.  Olga Millers, MD

## 2020-08-02 ENCOUNTER — Ambulatory Visit: Payer: PPO | Admitting: Cardiology

## 2020-08-02 ENCOUNTER — Other Ambulatory Visit: Payer: Self-pay

## 2020-08-02 ENCOUNTER — Encounter: Payer: Self-pay | Admitting: Cardiology

## 2020-08-02 ENCOUNTER — Telehealth: Payer: Self-pay | Admitting: *Deleted

## 2020-08-02 VITALS — BP 140/68 | HR 55 | Ht 73.0 in | Wt 192.6 lb

## 2020-08-02 DIAGNOSIS — I429 Cardiomyopathy, unspecified: Secondary | ICD-10-CM

## 2020-08-02 DIAGNOSIS — I714 Abdominal aortic aneurysm, without rupture, unspecified: Secondary | ICD-10-CM

## 2020-08-02 DIAGNOSIS — I10 Essential (primary) hypertension: Secondary | ICD-10-CM | POA: Diagnosis not present

## 2020-08-02 DIAGNOSIS — Z5181 Encounter for therapeutic drug level monitoring: Secondary | ICD-10-CM

## 2020-08-02 NOTE — Patient Instructions (Addendum)
Medication Instructions:  I will call you later today to review your medications   *If you need a refill on your cardiac medications before your next appointment, please call your pharmacy*  Lab Work: NONE  If you have labs (blood work) drawn today and your tests are completely normal, you will receive your results only by: Marland Kitchen MyChart Message (if you have MyChart) OR . A paper copy in the mail If you have any lab test that is abnormal or we need to change your treatment, we will call you to review the results.   Testing/Procedures: Your physician has requested that you have an abdominal aorta duplex. During this test, an ultrasound is used to evaluate the aorta. Allow 30 minutes for this exam. Do not eat after midnight the day before and avoid carbonated beverages IN MARCH   Follow-Up: At [facility], you and your health needs are our priority.  As part of our continuing mission to provide you with exceptional heart care, we have created designated Provider Care Teams.  These Care Teams include your primary Cardiologist (physician) and Advanced Practice Providers (APPs -  Physician Assistants and Nurse Practitioners) who all work together to provide you with the care you need, when you need it.  We recommend signing up for the patient portal called "MyChart".  Sign up information is provided on this After Visit Summary.  MyChart is used to connect with patients for Virtual Visits (Telemedicine).  Patients are able to view lab/test results, encounter notes, upcoming appointments, etc.  Non-urgent messages can be sent to your provider as well.   To learn more about what you can do with MyChart, go to [email or web address].    Your next appointment:   12 month(s)  You will receive a reminder letter in the mail two months in advance. If you don't receive a letter, please call our office to schedule the follow-up appointment.  The format for your next appointment:   In Person  Provider:    You may see Olga Millers, MD or one of the following Advanced Practice Providers on your designated Care Team:    Corine Shelter, PA-C  [place], [place]  Edd Fabian, [place]

## 2020-08-02 NOTE — Telephone Encounter (Signed)
Patient had office visit today with Dr Jens Som and was not exactly sure of medications he was taking Patient was to go home and review his medications and I was to call to follow up Left message to call back

## 2020-08-03 NOTE — Telephone Encounter (Signed)
Follow up:    Patient calling back to report his medications. Losartan. Potassium 25 mg, amlodipine 5 mg, metoprolol 25 mg, meclizine HCI 25 mg. rosuvastatin 20 mg,  escipalopram oxalate 5 mg.meloxicam 15 mg,Levothyroxine 125 mg

## 2020-08-03 NOTE — Telephone Encounter (Signed)
Increase losartan to 50 mg daily; DC amlodipine; bmet one week; follow BP Olga Millers

## 2020-08-04 MED ORDER — LOSARTAN POTASSIUM 50 MG PO TABS: 50.0000 mg | ORAL_TABLET | Freq: Every day | ORAL | 3 refills | Status: DC

## 2020-08-04 NOTE — Telephone Encounter (Signed)
Left message for the pt to call back.

## 2020-08-04 NOTE — Telephone Encounter (Signed)
Advised patient, verbalized understanding  New Losartan Rx sent to pharmacy, order for BMET placed Patient to call back in couple weeks to report blood pressure readings

## 2020-08-04 NOTE — Telephone Encounter (Signed)
Left message to call back  

## 2020-08-10 DIAGNOSIS — L03116 Cellulitis of left lower limb: Secondary | ICD-10-CM | POA: Diagnosis not present

## 2020-08-10 DIAGNOSIS — Z23 Encounter for immunization: Secondary | ICD-10-CM | POA: Diagnosis not present

## 2020-08-13 NOTE — Telephone Encounter (Signed)
Left message for patient to call back  

## 2020-08-13 NOTE — Telephone Encounter (Signed)
Patient calling back.   °

## 2020-08-13 NOTE — Telephone Encounter (Signed)
Patient stated he was just returning a call. Informed him that the last person he talked to on 08/04/20, they had advised patient start losartan, get blood work, and call back in a couple of week with BP reading. Patient stated he hasn't been on the medication long, but is doing okay. Patient stated he would start checking his BP and give our office a call with readings in a few weeks.

## 2020-08-18 DIAGNOSIS — L03116 Cellulitis of left lower limb: Secondary | ICD-10-CM | POA: Diagnosis not present

## 2020-09-13 ENCOUNTER — Other Ambulatory Visit: Payer: Self-pay | Admitting: Internal Medicine

## 2021-01-07 ENCOUNTER — Other Ambulatory Visit: Payer: Self-pay | Admitting: Family Medicine

## 2021-01-07 DIAGNOSIS — I714 Abdominal aortic aneurysm, without rupture, unspecified: Secondary | ICD-10-CM

## 2021-01-11 ENCOUNTER — Telehealth: Payer: Self-pay | Admitting: *Deleted

## 2021-01-11 DIAGNOSIS — E7800 Pure hypercholesterolemia, unspecified: Secondary | ICD-10-CM

## 2021-01-11 NOTE — Telephone Encounter (Signed)
Left message for pt to call, labs from [facility] reviewed by dr Jens Som, due to his LDL of 102 we would like him to increase the rosuvastatin to 40 mg once daily. He can take 2 of the 20 mg tablets until he runs out. He will need repeat lab work in 12 weeks, fasting. Will mail lab slips to him.

## 2021-01-12 ENCOUNTER — Ambulatory Visit
Admission: RE | Admit: 2021-01-12 | Discharge: 2021-01-12 | Disposition: A | Discharge status: Home / Self Care | Payer: Medicare Other | Source: Ambulatory Visit | Attending: Family Medicine | Admitting: Family Medicine

## 2021-01-12 DIAGNOSIS — I714 Abdominal aortic aneurysm, without rupture, unspecified: Secondary | ICD-10-CM

## 2021-01-13 ENCOUNTER — Other Ambulatory Visit: Payer: Self-pay | Admitting: Cardiology

## 2021-01-18 ENCOUNTER — Other Ambulatory Visit: Payer: Self-pay

## 2021-01-18 DIAGNOSIS — R3129 Other microscopic hematuria: Secondary | ICD-10-CM | POA: Insufficient documentation

## 2021-01-18 DIAGNOSIS — I129 Hypertensive chronic kidney disease with stage 1 through stage 4 chronic kidney disease, or unspecified chronic kidney disease: Secondary | ICD-10-CM | POA: Insufficient documentation

## 2021-01-18 DIAGNOSIS — N400 Enlarged prostate without lower urinary tract symptoms: Secondary | ICD-10-CM | POA: Insufficient documentation

## 2021-01-18 DIAGNOSIS — I714 Abdominal aortic aneurysm, without rupture, unspecified: Secondary | ICD-10-CM

## 2021-01-18 DIAGNOSIS — I447 Left bundle-branch block, unspecified: Secondary | ICD-10-CM | POA: Insufficient documentation

## 2021-01-18 DIAGNOSIS — M8580 Other specified disorders of bone density and structure, unspecified site: Secondary | ICD-10-CM | POA: Insufficient documentation

## 2021-01-18 DIAGNOSIS — R911 Solitary pulmonary nodule: Secondary | ICD-10-CM | POA: Insufficient documentation

## 2021-01-18 DIAGNOSIS — I10 Essential (primary) hypertension: Secondary | ICD-10-CM | POA: Insufficient documentation

## 2021-01-18 DIAGNOSIS — F17200 Nicotine dependence, unspecified, uncomplicated: Secondary | ICD-10-CM | POA: Insufficient documentation

## 2021-01-18 DIAGNOSIS — N529 Male erectile dysfunction, unspecified: Secondary | ICD-10-CM | POA: Insufficient documentation

## 2021-01-18 DIAGNOSIS — E039 Hypothyroidism, unspecified: Secondary | ICD-10-CM | POA: Insufficient documentation

## 2021-01-18 DIAGNOSIS — Z720 Tobacco use: Secondary | ICD-10-CM | POA: Insufficient documentation

## 2021-01-18 DIAGNOSIS — J309 Allergic rhinitis, unspecified: Secondary | ICD-10-CM | POA: Insufficient documentation

## 2021-01-18 DIAGNOSIS — E785 Hyperlipidemia, unspecified: Secondary | ICD-10-CM | POA: Insufficient documentation

## 2021-01-18 DIAGNOSIS — F329 Major depressive disorder, single episode, unspecified: Secondary | ICD-10-CM | POA: Insufficient documentation

## 2021-01-26 ENCOUNTER — Other Ambulatory Visit: Payer: Self-pay

## 2021-01-26 ENCOUNTER — Ambulatory Visit (HOSPITAL_COMMUNITY)
Admission: RE | Admit: 2021-01-26 | Discharge: 2021-01-26 | Disposition: A | Discharge status: Home / Self Care | Payer: Medicare Other | Source: Ambulatory Visit | Attending: Vascular Surgery | Admitting: Vascular Surgery

## 2021-01-26 ENCOUNTER — Ambulatory Visit: Payer: Medicare Other | Admitting: Physician Assistant

## 2021-01-26 VITALS — BP 155/80 | HR 62 | Temp 98.2°F | Resp 20 | Ht 73.0 in | Wt 193.9 lb

## 2021-01-26 DIAGNOSIS — I714 Abdominal aortic aneurysm, without rupture, unspecified: Secondary | ICD-10-CM

## 2021-01-26 NOTE — Progress Notes (Signed)
Established Abdominal Aortic Aneurysm   History of Present Illness   Brandon Valdez is a 87 y.o. (1934/06/10) male who presents with chief complaint: follow up for AAA.  Previous studies demonstrate an AAA, measuring 4.3 cm.  The patient does not have back or abdominal pain.  The patient is a former smoker.  He denies claudication, rest pain, or non healing wounds of BLE.  He is taking an aspirin and statin daily.  He is followed regularly by his Cardiologist Dr. Jens Som for mild cardiomyopathy and CAD.  The patient's PMH, PSH, SH, and FamHx were reviewed and are unchanged from prior visit.  Current Outpatient Medications  Medication Sig Dispense Refill  . amLODipine (NORVASC) 5 MG tablet Take 1 tablet by mouth daily.    Marland Kitchen aspirin 81 MG tablet Take 81 mg by mouth daily.    . cetirizine (ZYRTEC) 10 MG tablet 1 tablet    . escitalopram (LEXAPRO) 5 MG tablet Take 1 tablet by mouth daily.    . fluticasone (FLONASE) 50 MCG/ACT nasal spray Place into both nostrils.    . hydrochlorothiazide (MICROZIDE) 12.5 MG capsule Take 12.5 mg by mouth daily.    Marland Kitchen ibuprofen (ADVIL,MOTRIN) 200 MG tablet Take 200 mg by mouth every 6 (six) hours as needed.    Marland Kitchen ipratropium (ATROVENT) 0.06 % nasal spray 2 sprays in each nostril    . levothyroxine (SYNTHROID) 125 MCG tablet Take 125 mcg by mouth daily.    . meclizine (ANTIVERT) 25 MG tablet Take 25 mg by mouth 3 (three) times daily as needed for dizziness.    . meloxicam (MOBIC) 15 MG tablet Take 15 mg by mouth daily.    . metoprolol succinate (TOPROL XL) 25 MG 24 hr tablet Take 1 tablet (25 mg total) by mouth daily. 90 tablet 3  . Multiple Vitamin (MULTIVITAMIN) capsule Take 1 capsule by mouth daily.    . psyllium (METAMUCIL) 58.6 % packet Take 1 packet by mouth daily.    . rosuvastatin (CRESTOR) 20 MG tablet TAKE 1 TABLET BY MOUTH DAILY 30 tablet 10  . Saw Palmetto 450 MG CAPS Take 1 capsule by mouth daily.    . sildenafil (REVATIO) 20 MG tablet 1-5  tablets as needed    . vitamin B-12 (CYANOCOBALAMIN) 1000 MCG tablet Take 1,000 mcg by mouth daily.    Marland Kitchen losartan (COZAAR) 50 MG tablet Take 1 tablet (50 mg total) by mouth daily. 90 tablet 3   No current facility-administered medications for this visit.    REVIEW OF SYSTEMS (negative unless checked):   Cardiac:  []  Chest pain or chest pressure? []  Shortness of breath upon activity? []  Shortness of breath when lying flat? []  Irregular heart rhythm?  Vascular:  []  Pain in calf, thigh, or hip brought on by walking? []  Pain in feet at night that wakes you up from your sleep? []  Blood clot in your veins? []  Leg swelling?  Pulmonary:  []  Oxygen at home? []  Productive cough? []  Wheezing?  Neurologic:  []  Sudden weakness in arms or legs? []  Sudden numbness in arms or legs? []  Sudden onset of difficult speaking or slurred speech? []  Temporary loss of vision in one eye? []  Problems with dizziness?  Gastrointestinal:  []  Blood in stool? []  Vomited blood?  Genitourinary:  []  Burning when urinating? []  Blood in urine?  Psychiatric:  []  Major depression  Hematologic:  []  Bleeding problems? []  Problems with blood clotting?  Dermatologic:  []  Rashes or ulcers?  Constitutional:  []   Fever or chills?  Ear/Nose/Throat:  []  Change in hearing? []  Nose bleeds? []  Sore throat?  Musculoskeletal:  []  Back pain? []  Joint pain? []  Muscle pain?   Physical Examination   Vitals:   01/26/21 0935  BP: (!) 155/80  Pulse: 62  Resp: 20  Temp: 98.2 F (36.8 C)  TempSrc: Temporal  SpO2: 96%  Weight: 193 lb 14.4 oz (88 kg)  Height: 6\' 1"  (1.854 m)   Body mass index is 25.58 kg/m.  General:  WDWN in NAD; vital signs documented above Gait: Not observed HENT: WNL, normocephalic Pulmonary: normal non-labored breathing Cardiac: regular HR Abdomen: soft, NT, no masses Skin: without rashes Vascular Exam/Pulses:  Right Left  Radial 2+ (normal) 2+ (normal)  DP 2+ (normal)  2+ (normal)   Extremities: without ischemic changes, without Gangrene , without cellulitis; without open wounds;  Musculoskeletal: no muscle wasting or atrophy  Neurologic: A&O X 3;  No focal weakness or paresthesias are detected Psychiatric:  The pt has Normal affect.   Non-Invasive Vascular Imaging   AAA Duplex   Current size: 4.6 cm  Previous size: 4.3 cm  R CIA: 1.9 cm  L CIA: 2.3 cm   Medical Decision Making   Brandon Valdez is a 87 y.o. ([birth year]) male who presents with: asymptomatic AAA.   Based on duplex, maximal diameter of AAA is 4.6 which is a slight increase from 4.3cm 12 months prior; no indication for repair currently  Follow regularly with PCP and Cardiology for management of chronic medical conditions  Continue aspirin and statin  Recheck AAA duplex in 1 year   PA-C [facility] Office: [phone]  Clinic MD: 

## 2021-01-31 ENCOUNTER — Other Ambulatory Visit (HOSPITAL_COMMUNITY): Payer: PPO

## 2021-02-08 ENCOUNTER — Encounter: Payer: Self-pay | Admitting: *Deleted

## 2021-02-08 MED ORDER — EZETIMIBE 10 MG PO TABS: 10.0000 mg | ORAL_TABLET | Freq: Every day | ORAL | 3 refills | Status: DC

## 2021-02-08 NOTE — Telephone Encounter (Signed)
Letter mailed to the patient including script and lab orders.

## 2021-02-22 IMAGING — CT CT CHEST W/O CM
1 series · 14 of 34 positions shown, 18 images · non-contrast
Comparison: Chest CT 02/09/2017.

CLINICAL DATA: 85-year-old male with history of pulmonary nodule.
Followup study.

EXAM:
CT CHEST WITHOUT CONTRAST
TECHNIQUE: Multidetector CT imaging of the chest was performed following the
standard protocol without IV contrast.

[Series 2: chest w/(date) · axial · 0.81mm/px · z∈[-318,-28]mm · 14 of 171 slices shown, 18 images]
[im 13/171  mediastinal]
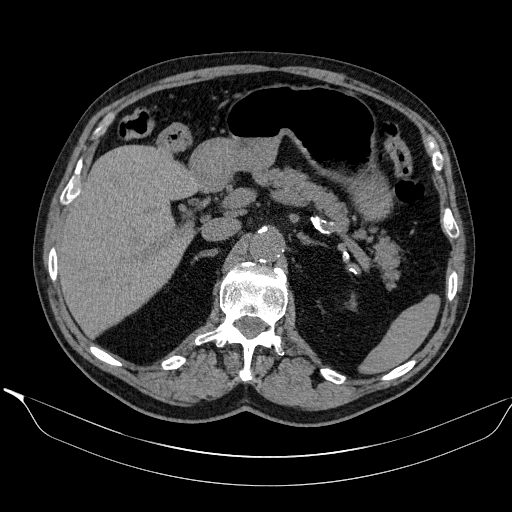
[im 13/171  lung]
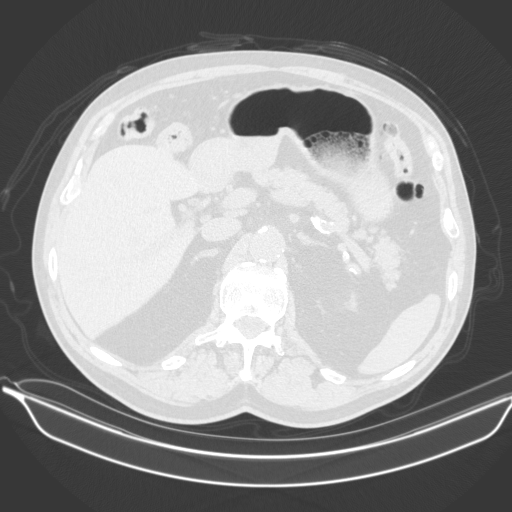
[im 26/171  lung]
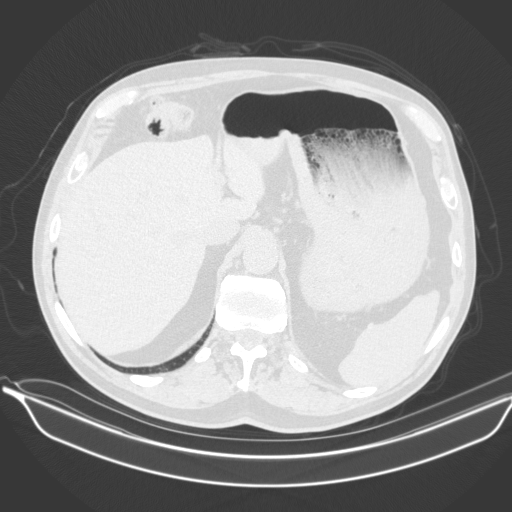
[im 35/171  lung]
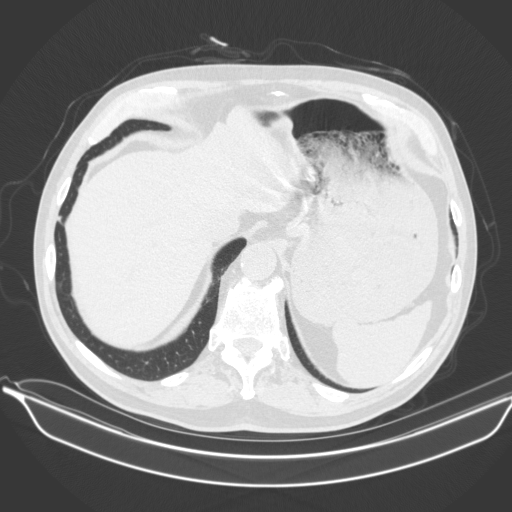
[im 51/171  lung]
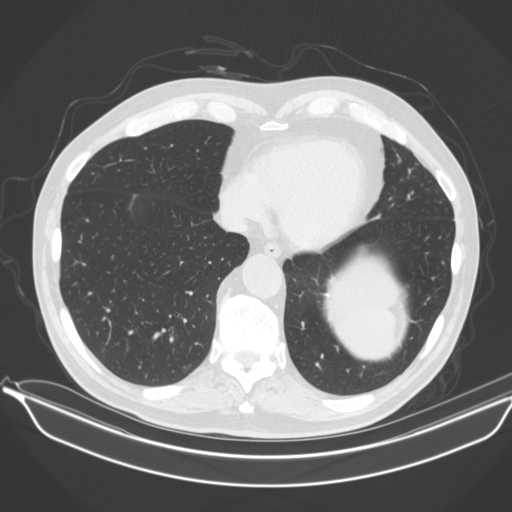
[im 63/171  mediastinal]
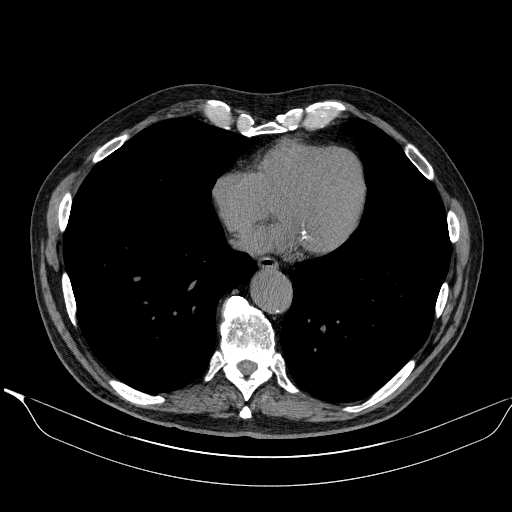
[im 63/171  lung]
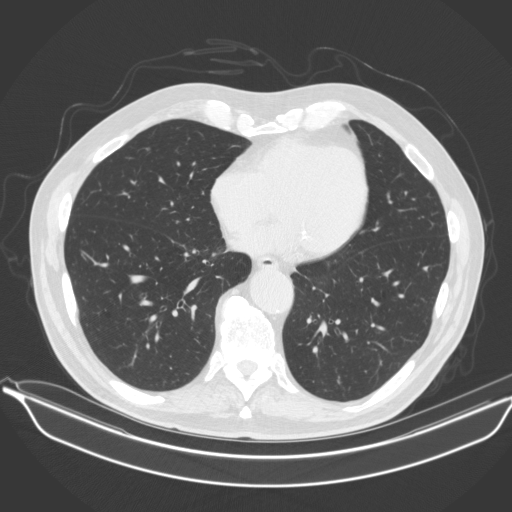
[im 70/171  lung]
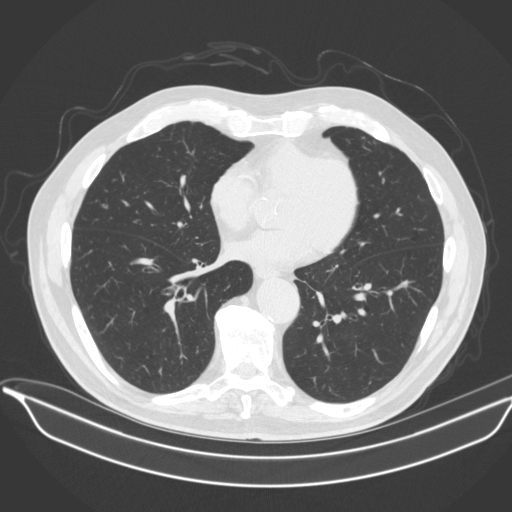
[im 81/171  lung]
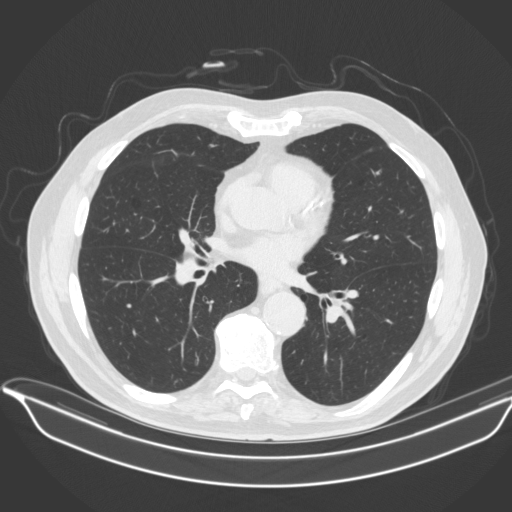
[im 91/171  lung]
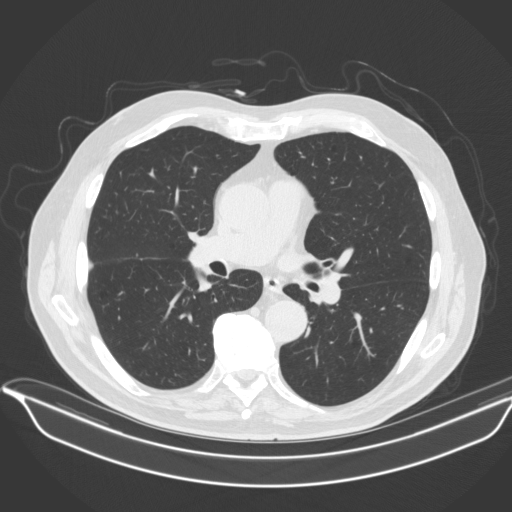
[im 101/171  mediastinal]
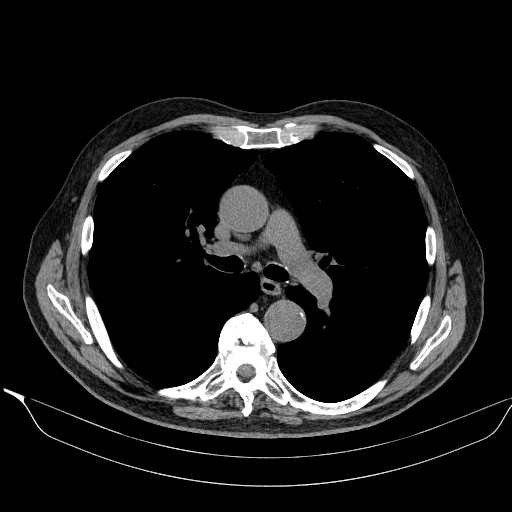
[im 101/171  lung]
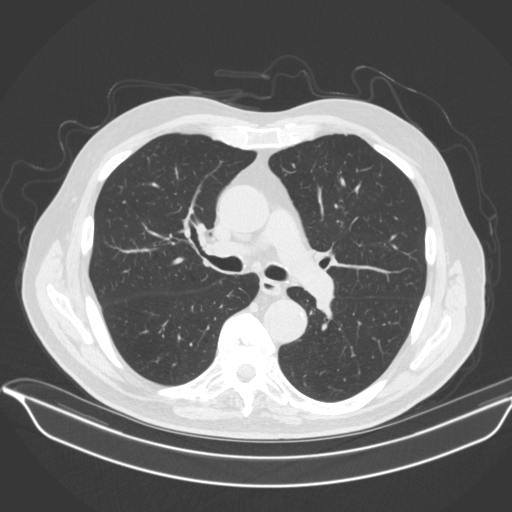
[im 108/171  lung]
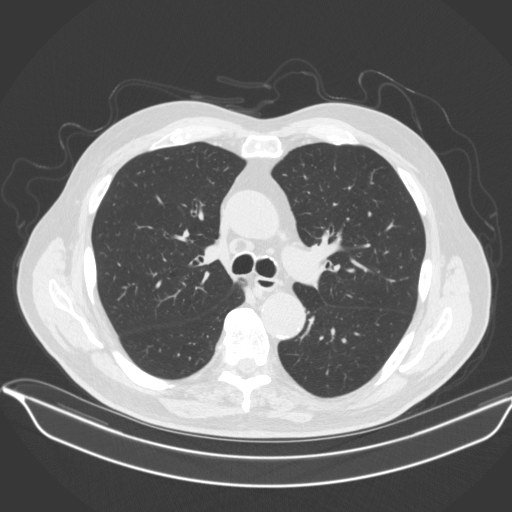
[im 126/171  lung]
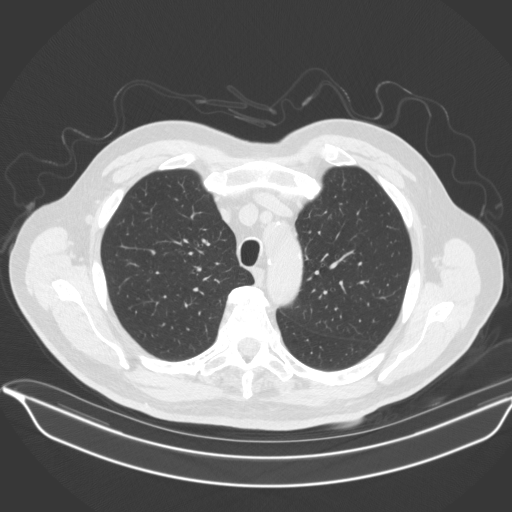
[im 137/171  lung]
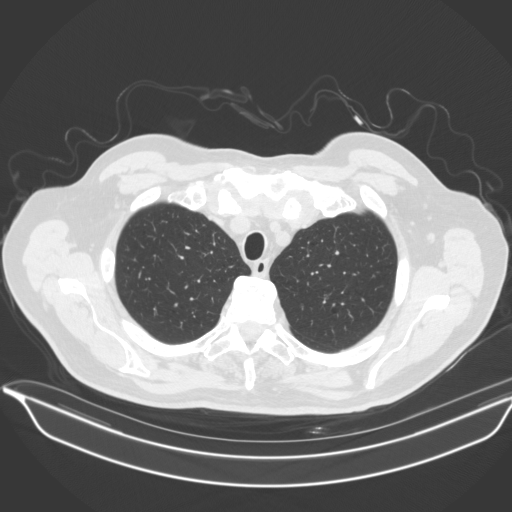
[im 145/171  mediastinal]
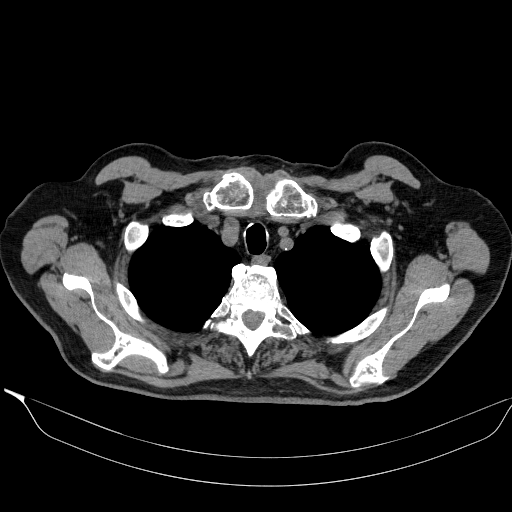
[im 145/171  lung]
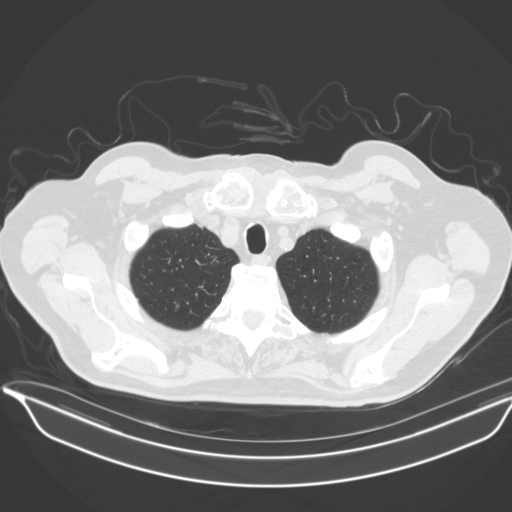
[im 158/171  lung]
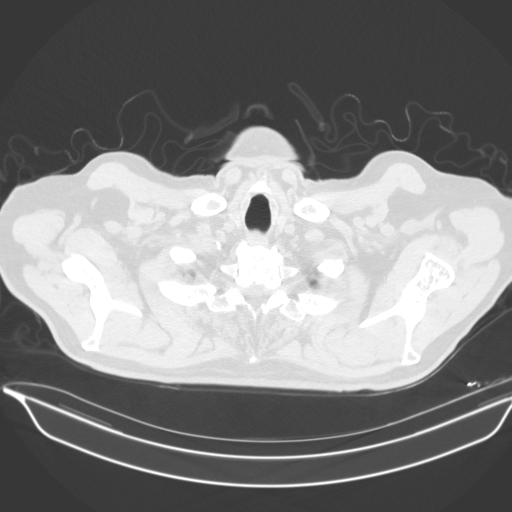

[14 of 34 positions shown; findings below may reference images not displayed]

FINDINGS: Cardiovascular: Heart size is normal. There is no significant
pericardial fluid, thickening or pericardial calcification. There is
aortic atherosclerosis, as well as atherosclerosis of the great
vessels of the mediastinum and the coronary arteries, including
calcified atherosclerotic plaque in the left main, left anterior
descending, left circumflex and right coronary arteries. Severe
calcifications of the aortic valve.

Mediastinum/Nodes: No pathologically enlarged mediastinal or hilar
lymph nodes. Please note that accurate exclusion of hilar adenopathy
is limited on noncontrast CT scans. Esophagus is unremarkable in
appearance. No axillary lymphadenopathy.

Lungs/Pleura: All previously noted tiny pulmonary nodules measuring
4 mm or less in size are stable compared to the prior examination
from 02/09/2017, considered definitively benign. There is also a
cluster of multiple 1-3 mm peribronchovascular micro nodules in the
periphery of the right lower lobe, new compared to the prior study,
but strongly favored to be benign areas of mucoid impaction within
terminal bronchioles. No other larger more suspicious appearing
pulmonary nodules or masses are noted. No acute consolidative
airspace disease. No pleural effusions.

Upper Abdomen: Exophytic 3.6 cm low-attenuation lesion in the
anterior aspect of the upper pole the left kidney, incompletely
characterized on today's non-contrast CT examination, but
statistically likely to represent a cyst. Aortic atherosclerosis.

Musculoskeletal: There are no aggressive appearing lytic or blastic
lesions noted in the visualized portions of the skeleton.
IMPRESSION: 1. No suspicious appearing pulmonary nodules or masses are noted.
See discussion above.
2. Aortic atherosclerosis, in addition to left main and 3 vessel
coronary artery disease.
3. There are calcifications of the aortic valve. Echocardiographic
correlation for evaluation of potential valvular dysfunction may be
warranted if clinically indicated.

Aortic Atherosclerosis ([id number]).

## 2021-05-10 ENCOUNTER — Other Ambulatory Visit: Payer: Self-pay | Admitting: Cardiology

## 2021-05-10 DIAGNOSIS — I429 Cardiomyopathy, unspecified: Secondary | ICD-10-CM

## 2021-08-18 ENCOUNTER — Encounter: Payer: Self-pay | Admitting: *Deleted

## 2021-08-22 ENCOUNTER — Encounter: Payer: Self-pay | Admitting: Psychiatry

## 2021-08-22 ENCOUNTER — Telehealth: Payer: Self-pay | Admitting: Psychiatry

## 2021-08-22 ENCOUNTER — Ambulatory Visit: Payer: Medicare Other | Admitting: Psychiatry

## 2021-08-22 VITALS — BP 129/76 | HR 66 | Ht 72.0 in | Wt 195.0 lb

## 2021-08-22 DIAGNOSIS — R413 Other amnesia: Secondary | ICD-10-CM | POA: Diagnosis not present

## 2021-08-22 NOTE — Progress Notes (Signed)
[facility]  PATIENT: Brandon Valdez DOB: 26-Feb-1934  REFERRING CLINICIAN: Irena Reichmann, DO HISTORY FROM: self and friend REASON FOR VISIT: memory loss   HISTORICAL  CHIEF COMPLAINT:  Chief Complaint  Patient presents with   Memory Loss    RM 1 with friend Brandon Valdez Pt is well, has been experiancing memory loss since having covid about 2 yrs ago     HISTORY OF PRESENT ILLNESS:  The patient presents for evaluation of memory loss which has been present since he developed COVID 2 years ago. Prior to this had mild memory issues but felt symptoms accelerated after COVID.   TBI:  No past history of TBI Stroke: no past history of stroke Seizures: no past history of seizures Sleep:  no history of sleep apnea.  Has never had sleep study.  Mood:  Endorses depressed mood which has been worse since COVID. Part of this is due to him not working anymore. 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  Functional status: independent in all ADLs and IADLs Cooking: no Cleaning: no Shopping: no Driving: got lost once while driving, may forget where store is sometimes Bills: slower to write checks Medications: no Memory: Will forget conversations and dates, repeat questions several times, slow processing time Ever left the stove on by accident?: no Forget how to use items around the house?: no Getting lost going to familiar places?: no Forgetting loved ones names?: forgets acquaintances but not loved ones Word finding difficulty? 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     aspirin 81 MG tablet Take 81 mg by mouth daily.     cetirizine (ZYRTEC) 10 MG tablet 1 tablet     escitalopram (LEXAPRO) 5 MG tablet Take 1 tablet by mouth daily.     ezetimibe (ZETIA) 10 MG tablet Take 1 tablet (10 mg total) by mouth daily. 90 tablet 3   fluticasone (FLONASE) 50 MCG/ACT nasal spray Place into both nostrils.     hydrochlorothiazide (MICROZIDE) 12.5 MG capsule Take 12.5 mg by mouth daily.     ibuprofen (ADVIL,MOTRIN) 200 MG tablet Take 200 mg by mouth every 6 (six) hours as needed.     ipratropium (ATROVENT) 0.06 % nasal spray 2 sprays in each nostril     levothyroxine (SYNTHROID) 125 MCG tablet Take 125 mcg by mouth daily.     losartan (COZAAR) 50 MG tablet TAKE 1 TABLET BY MOUTH DAILY 90 tablet 3   meclizine (ANTIVERT) 25 MG tablet Take 25 mg by mouth 3 (three) times daily as needed for dizziness.     meloxicam (MOBIC) 15 MG tablet Take 15 mg by mouth daily.     metoprolol succinate (TOPROL XL) 25 MG 24 hr tablet Take 1 tablet (25 mg total) by mouth daily. 90 tablet 3   Multiple Vitamin (MULTIVITAMIN) capsule Take 1 capsule by mouth daily.  psyllium (METAMUCIL) 58.6 % packet Take 1 packet by mouth daily.     rosuvastatin (CRESTOR) 20 MG tablet TAKE 1 TABLET BY MOUTH DAILY 30 tablet 10   Saw Palmetto 450 MG CAPS Take 1 capsule by mouth daily.     sildenafil (REVATIO) 20 MG tablet 1-5 tablets as needed     vitamin B-12 (CYANOCOBALAMIN) 1000 MCG tablet Take 1,000 mcg by mouth daily.     No facility-administered medications prior to visit.    PAST MEDICAL HISTORY: Past Medical History:  Diagnosis Date   AAA (abdominal aortic aneurysm)    Hyperlipidemia    Hypertension    Hypothyroid    Nephrolithiasis    PUD (peptic ulcer disease)     PAST SURGICAL  HISTORY: Past Surgical History:  Procedure Laterality Date   CATARACT EXTRACTION, BILATERAL     INGUINAL HERNIA REPAIR Left    spine cyst     TONSILLECTOMY      FAMILY HISTORY: Family History  Problem Relation Age of Onset   AAA (abdominal aortic aneurysm) Father    AAA (abdominal aortic aneurysm) Brother    Alzheimer's disease Brother    Panic disorder Son     SOCIAL HISTORY: Social History   Socioeconomic History   Marital status: Single    Spouse name: Not on file   Number of children: 3   Years of education: Not on file   Highest education level: Not on file  Occupational History   Not on file  Tobacco Use   Smoking status: Former    Packs/day: 1.00    Years: 45.00    Pack years: 45.00    Types: Cigarettes    Start date: 1952    Quit date: 1977    Years since quitting: 45.7   Smokeless tobacco: Former    Quit date: 11/21/1975  Vaping Use   Vaping Use: Never used  Substance and Sexual Activity   Alcohol use: Yes    Comment: Occasional   Drug use: No   Sexual activity: Not on file  Other Topics Concern   Not on file  Social History Narrative   Not on file   Social Determinants of Health   Financial Resource Strain: Not on file  Food Insecurity: Not on file  Transportation Needs: Not on file  Physical Activity: Not on file  Stress: Not on file  Social Connections: Not on file  Intimate Partner Violence: Not on file     PHYSICAL EXAM  GENERAL EXAM/CONSTITUTIONAL: Vitals:  Vitals:   08/22/21 1252  BP: 129/76  Pulse: 66  Weight: 195 lb (88.5 kg)  Height: 6' (1.829 m)   Body mass index is 26.45 kg/m. Wt Readings from Last 3 Encounters:  08/22/21 195 lb (88.5 kg)  01/26/21 193 lb 14.4 oz (88 kg)  08/02/20 192 lb 9.6 oz (87.4 kg)   Patient is in no distress; well developed, nourished and groomed; neck is supple  CARDIOVASCULAR: Examination of carotid arteries is normal; no carotid bruits Regular rate and rhythm, no murmurs Examination of  peripheral vascular system by observation and palpation is normal  EYES: Pupils round and reactive to light, Visual fields full to confrontation, Extraocular movements intacts,   MUSCULOSKELETAL: Gait, strength, tone, movements noted in Neurologic exam below  NEUROLOGIC: MENTAL STATUS:  MMSE - Mini Mental State Exam 08/22/2021  Orientation to time 2  Orientation to Place 3  Registration 3  Attention/ Calculation 1  Recall 0  Language- name 2 objects 2  Language- repeat 1  Language- follow 3 step command 3  Language- read & follow direction 1  Write a sentence 1  Copy design 0  Total score 17   awake, alert, oriented to person, place and time recent and remote memory intact normal attention and concentration language fluent, comprehension intact, naming intact fund of knowledge appropriate  CRANIAL NERVE:  2nd, 3rd, 4th, 6th - pupils equal and reactive to light, visual fields full to confrontation, extraocular muscles intact, no nystagmus 5th - facial sensation symmetric 7th - facial strength symmetric 8th - hearing intact 9th - palate elevates symmetrically, uvula midline 11th - shoulder shrug symmetric 12th - tongue protrusion midline  MOTOR:  normal bulk and tone, no cogwheeling, full strength in the BUE, BLE  SENSORY:  normal and symmetric to light touch all 4 extremities  COORDINATION:  finger-nose-finger, fine finger movements normal  REFLEXES:  deep tendon reflexes 2+ bilateral upper extremities 1+ bilateral patellars  GAIT/STATION:  normal     DIAGNOSTIC DATA (LABS, IMAGING, TESTING) - I reviewed patient records, labs, notes, testing and imaging myself where available.  No results found for: WBC, HGB, HCT, MCV, PLT    Component Value Date/Time   NA 139 01/29/2020 1016   K 4.4 01/29/2020 1016   CL 99 01/29/2020 1016   CO2 26 01/29/2020 1016   GLUCOSE 92 01/29/2020 1016   BUN 11 01/29/2020 1016   CREATININE 0.87 01/29/2020 1016   CALCIUM 9.3  01/29/2020 1016   PROT 6.4 01/28/2019 1549   ALBUMIN 4.2 01/28/2019 1549   AST 24 01/28/2019 1549   ALT 19 01/28/2019 1549   ALKPHOS 58 01/28/2019 1549   BILITOT 0.4 01/28/2019 1549   GFRNONAA 80 01/29/2020 1016   GFRAA 92 01/29/2020 1016   Lab Results  Component Value Date   CHOL 138 01/28/2019   HDL 38 (L) 01/28/2019   LDLCALC 75 01/28/2019   TRIG 124 01/28/2019   CHOLHDL 3.6 01/28/2019   06/2021 TSH wnl    ASSESSMENT AND PLAN  87 y.o. year old male with a history of HLD, hypothyroidism, MDD, AAA, CAD, cardiomyopathy who presents for evaluation of memory issues over the past 2 years which worsened following COVID infection. His MMSE score of 17 is suggestive of possible dementia, however this is confounded by his depressed mood which can mimic dementia. Will refer for neuropsychological evaluation to better characterize his/her reported deficits and to establish a cognitive baseline. MRI brain ordered to assess for signs of neurodegeneration and/or significant vascular disease. Offered referral to cognitive rehab, and he would prefer to wait until testing is complete.  1. Alzheimer's disease, unspecified (CODE) (HCC)     PLAN: 1.) Labs: B12 2.) MRI brain  3.) Will place referral for neuropsychological testing  4.) General brain health measures discussed, including the importance of regular aerobic exercise.    Orders Placed This Encounter  Procedures   MR BRAIN W WO CONTRAST   B12 and Folate Panel   Neuropsychological assessment     No orders of the defined types were placed in this encounter.   Return in about 3 months (around 11/22/2021).    Ocie Doyne, MD 08/22/21 1:40 PM  [facility] [address] [place], [place] [address] [phone]

## 2021-08-22 NOTE — Patient Instructions (Addendum)
MRI brain Blood work to look at vitamin B12 Neuropsychological assessments  There are well-accepted and sensible ways to reduce risk for Alzheimers disease and other degenerative brain disorders .  Exercise Daily Walk A daily 20 minute walk should be part of your routine. Disease related apathy can be a significant roadblock to exercise and the only way to overcome this is to make it a daily routine and perhaps have a reward at the end (something your loved one loves to eat or drink perhaps) or a personal trainer coming to the home can also be very useful. Most importantly, the patient is much more likely to exercise if the caregiver / spouse does it with him/her. In general a structured, repetitive schedule is best.  General Health: Any diseases which effect your body will effect your brain such as a pneumonia, urinary infection, blood clot, heart attack or stroke. Keep contact with your primary care doctor for regular follow ups.  Sleep. A good nights sleep is healthy for the brain. Seven hours is recommended. If you have insomnia or poor sleep habits we can give you some instructions. If you have sleep apnea wear your mask.  Diet: Eating a heart healthy diet is also a good idea; fish and poultry instead of red meat, nuts (mostly non-peanuts), vegetables, fruits, olive oil or canola oil (instead of butter), minimal salt (use other spices to flavor foods), whole grain rice, bread, cereal and pasta and wine in moderation.Research is now showing that the MIND diet, which is a combination of The Mediterranean diet and the DASH diet, is beneficial for cognitive processing and longevity. Information about this diet can be found in The MIND Diet, a book by Alonna Minium, MS, RDN, and online at [employer]Wild[employer].es

## 2021-08-22 NOTE — Telephone Encounter (Signed)
[employer] medicare order sent to [facility], [facility] they will reach out to the patient to schedule.  

## 2021-08-23 ENCOUNTER — Telehealth: Payer: Self-pay | Admitting: *Deleted

## 2021-08-23 LAB — B12 AND FOLATE PANEL
Folate: 16.6 ng/mL (ref >3.0)
Vitamin B-12: 650 pg/mL (ref 232–1245)

## 2021-08-23 NOTE — Telephone Encounter (Signed)
Called patient and informed him his labs are normal. Patient verbalized understanding, appreciation.  

## 2021-09-06 ENCOUNTER — Other Ambulatory Visit: Payer: Self-pay

## 2021-09-06 ENCOUNTER — Ambulatory Visit
Admission: RE | Admit: 2021-09-06 | Discharge: 2021-09-06 | Disposition: A | Discharge status: Home / Self Care | Payer: Medicare Other | Source: Ambulatory Visit | Attending: Psychiatry | Admitting: Psychiatry

## 2021-09-06 DIAGNOSIS — R413 Other amnesia: Secondary | ICD-10-CM

## 2021-09-06 MED ORDER — GADOBUTROL 1 MMOL/ML IV SOLN
18.0000 mL | Freq: Once | INTRAVENOUS | Status: AC | PRN
  Administered 2021-09-06: 18 mL via INTRAVENOUS

## 2021-09-08 ENCOUNTER — Telehealth: Payer: Self-pay

## 2021-09-08 NOTE — Telephone Encounter (Signed)
Contact pt, spo eto he and his wife. Informed them That there is some mild volume loss in the brain which can be seen with normal aging or with early dementia. The neuropsychological testing will help give [place] a better idea of how much of his memory issues are caused by a possible dementia vs other causes like depression. Informed that we can talk about next steps once all the testing is complete at his next appointment. Both understood and had no questions at the time and was appreciative of the call.

## 2021-09-08 NOTE — Telephone Encounter (Signed)
-----   Message from Ocie Doyne, MD sent at 09/08/2021  2:48 PM EDT ----- There is some mild volume loss in the brain which can be seen with normal aging or with early dementia. The neuropsychological testing will help give [place] a better idea of how much of his memory issues are caused by a possible dementia vs other causes like depression. We can talk about next steps once all the testing is complete at his next appointment.

## 2021-09-20 NOTE — Addendum Note (Signed)
Addended by: Ocie Doyne on: 09/20/2021 12:00 PM   Modules accepted: Orders

## 2021-10-10 ENCOUNTER — Other Ambulatory Visit: Payer: Self-pay

## 2021-10-10 ENCOUNTER — Encounter: Payer: Self-pay | Admitting: Psychiatry

## 2021-10-10 ENCOUNTER — Ambulatory Visit (INDEPENDENT_AMBULATORY_CARE_PROVIDER_SITE_OTHER): Payer: PPO | Admitting: Psychiatry

## 2021-10-10 VITALS — BP 124/62 | HR 73 | Ht 73.0 in | Wt 194.0 lb

## 2021-10-10 DIAGNOSIS — R413 Other amnesia: Secondary | ICD-10-CM | POA: Diagnosis not present

## 2021-10-10 NOTE — Patient Instructions (Addendum)
Let me know if you would be interested in cognitive rehab. This is done by Speech Therapy to help with word finding difficulty and attention problems Neuropsychological testing  A person with mild cognitive impairment (MCI) experiences memory problems greater than normally expected with aging, but not serious enough to interfere with daily activities.  The patient with MCI complains of difficulty with memory. Typically, the complaints include trouble remembering the names of people they met recently, trouble remembering the flow of a conversation, and an increased tendency to misplace things, or similar problems. In many cases, the individual will be aware of these difficulties and will compensate with increased reliance on notes and calendars.   Although there is an increased chances of going on to develop dementia, it is not possible currently to predict with certainty which patients with MCI will or will not go on to develop dementia. There is currently no specific treatment for MCI. People leading sedentary lifestyles are at greater risk for developing dementia. Increased physical activity and brain exercise can help with maintaining brain function.  Tasks to improve attention/working memory 1. Good sleep hygiene (7-8 hrs of sleep) 2. Learning a new skill (Painting, Carpentry, Pottery, new language, Knitting). 3.Cognitive exercises (keep a daily journal, Puzzles) 4. Physical exercise and training  (30 min/day X 4 days week) 5. Being on Antidepressant if needed 6.Yoga, Meditation, Tai Chi 7. Decrease alcohol intake 8.Have a clear schedule and structure in daily routine

## 2021-10-10 NOTE — Telephone Encounter (Signed)
Patient saw Dr. Delena Bali today and an additional Neuropsych referral was placed. First referral was sent to [facility]. They attempted to contact patient x3. The last time they called was 11/10. Patient did not answer. Patient will need to reach back out to them if he would like to schedule this.  I have called and LVM with this information.

## 2021-10-10 NOTE — Progress Notes (Signed)
   CC:  memory loss  Follow-up Visit  Last visit: 08/22/21  Brief HPI: 87 year old male with a history of HLD, hypothyroidism, MDD, AAA, CAD, cardiomyopathy who presents for evaluation of memory issues over the past 2 years which worsened following COVID infection.  B12 and TSH levels were normal. MRI brain showed mild atrophy.  Interval History: He has been doing well since his last visit. Continues to struggle with depression, is currently taking Lexapro. Continues to struggle with word finding difficulty and repeating questions. He is maintaining independence well without impairment of his ADLs.  Physical Exam:   Vital Signs: BP 124/62   Pulse 73   Ht 6\' 1"  (1.854 m)   Wt 194 lb (88 kg)   SpO2 95%   BMI 25.60 kg/m  GENERAL:  well appearing, in no acute distress, alert  SKIN:  Color, texture, turgor normal. No rashes or lesions HEAD:  Normocephalic/atraumatic. RESP: normal respiratory effort MSK:  No gross joint deformities.   NEUROLOGICAL: Mental Status: Alert, oriented to person, place and time, Follows commands, and Speech fluent and appropriate. Cranial Nerves: PERRL, face symmetric, no dysarthria, hearing grossly intact Motor: moves all extremities equally Gait: normal-based.  IMPRESSION: 87 year old male who presents for follow up of persistent memory issues following COVID infection in 2020. MRI brain showed mild atrophy. His symptoms are most consistent with mild cognitive impairment as he is currently able to maintain independent functioning without impairment of his ADLs. Discussed this diagnosis and that there is increased risk of developing dementia. General brain health measures discussed including regular sleep, and regular cognitive and aerobic exercise. Will defer medication for now as he is maintaining good functioning. Will await neuropsychological testing to better characterize his memory deficits. Offered cognitive rehab, and he would prefer to hold off at  this time.  PLAN: -Neuropsych testing -Next steps: consider cognitive rehab  Follow-up: 1 year  I spent a total of 33 minutes on the date of the service. Discussed medication side effects, adverse reactions and drug interactions. Written educational materials and patient instructions outlining all of the above were given.  2021, MD 10/10/21 1:44 PM

## 2021-12-06 ENCOUNTER — Ambulatory Visit: Payer: Medicare Other | Admitting: Psychiatry

## 2021-12-07 DIAGNOSIS — M25561 Pain in right knee: Secondary | ICD-10-CM | POA: Diagnosis not present

## 2022-01-10 DIAGNOSIS — E785 Hyperlipidemia, unspecified: Secondary | ICD-10-CM | POA: Diagnosis not present

## 2022-01-10 DIAGNOSIS — R911 Solitary pulmonary nodule: Secondary | ICD-10-CM | POA: Diagnosis not present

## 2022-01-10 DIAGNOSIS — R413 Other amnesia: Secondary | ICD-10-CM | POA: Diagnosis not present

## 2022-01-10 DIAGNOSIS — E039 Hypothyroidism, unspecified: Secondary | ICD-10-CM | POA: Diagnosis not present

## 2022-01-10 DIAGNOSIS — I10 Essential (primary) hypertension: Secondary | ICD-10-CM | POA: Diagnosis not present

## 2022-01-10 DIAGNOSIS — Z0000 Encounter for general adult medical examination without abnormal findings: Secondary | ICD-10-CM | POA: Diagnosis not present

## 2022-01-16 ENCOUNTER — Encounter: Payer: Self-pay | Admitting: Cardiology

## 2022-01-16 ENCOUNTER — Other Ambulatory Visit: Payer: Self-pay | Admitting: Family Medicine

## 2022-01-16 ENCOUNTER — Ambulatory Visit (INDEPENDENT_AMBULATORY_CARE_PROVIDER_SITE_OTHER): Payer: PPO | Admitting: Cardiology

## 2022-01-16 ENCOUNTER — Telehealth: Payer: Self-pay

## 2022-01-16 ENCOUNTER — Other Ambulatory Visit: Payer: Self-pay

## 2022-01-16 VITALS — BP 130/70 | HR 69 | Ht 73.0 in | Wt 194.0 lb

## 2022-01-16 DIAGNOSIS — R911 Solitary pulmonary nodule: Secondary | ICD-10-CM

## 2022-01-16 DIAGNOSIS — I714 Abdominal aortic aneurysm, without rupture, unspecified: Secondary | ICD-10-CM | POA: Diagnosis not present

## 2022-01-16 DIAGNOSIS — I10 Essential (primary) hypertension: Secondary | ICD-10-CM | POA: Diagnosis not present

## 2022-01-16 DIAGNOSIS — I429 Cardiomyopathy, unspecified: Secondary | ICD-10-CM

## 2022-01-16 DIAGNOSIS — E7800 Pure hypercholesterolemia, unspecified: Secondary | ICD-10-CM

## 2022-01-16 NOTE — Progress Notes (Signed)
HPI:FU AAA. Nuclear study October 2015 showed ejection fraction 46% with left bundle branch block artifact and no ischemia. Echocardiogram January 2020 showed ejection fraction 40 to [id number], grade 1 diastolic dysfunction. Chest CT September 2020 showed aortic atherosclerosis and three-vessel coronary disease.  Abdominal ultrasound March 2022 showed 4.3 cm abdominal aortic aneurysm and dilated common iliac arteries.  Since last seen he denies dyspnea, chest pain, palpitations, syncope, pedal edema.  Occasional dizziness with standing.  Current Outpatient Medications  Medication Sig Dispense Refill   amLODipine (NORVASC) 5 MG tablet Take 1 tablet by mouth daily.     aspirin 81 MG tablet Take 81 mg by mouth daily.     cetirizine (ZYRTEC) 10 MG tablet 1 tablet     escitalopram (LEXAPRO) 5 MG tablet Take 1 tablet by mouth daily.     fluticasone (FLONASE) 50 MCG/ACT nasal spray Place into both nostrils.     hydrochlorothiazide (MICROZIDE) 12.5 MG capsule Take 12.5 mg by mouth daily.     ibuprofen (ADVIL,MOTRIN) 200 MG tablet Take 200 mg by mouth every 6 (six) hours as needed.     ipratropium (ATROVENT) 0.06 % nasal spray 2 sprays in each nostril     levothyroxine (SYNTHROID) 125 MCG tablet Take 125 mcg by mouth daily.     losartan (COZAAR) 50 MG tablet TAKE 1 TABLET BY MOUTH DAILY 90 tablet 3   meclizine (ANTIVERT) 25 MG tablet Take 25 mg by mouth 3 (three) times daily as needed for dizziness.     meloxicam (MOBIC) 15 MG tablet Take 15 mg by mouth daily.     metoprolol succinate (TOPROL XL) 25 MG 24 hr tablet Take 1 tablet (25 mg total) by mouth daily. 90 tablet 3   Multiple Vitamin (MULTIVITAMIN) capsule Take 1 capsule by mouth daily.     psyllium (METAMUCIL) 58.6 % packet Take 1 packet by mouth daily.     rosuvastatin (CRESTOR) 20 MG tablet TAKE 1 TABLET BY MOUTH DAILY 30 tablet 10   Saw Palmetto 450 MG CAPS Take 1 capsule by mouth daily.     sildenafil (REVATIO) 20 MG tablet 1-5 tablets as  needed     vitamin B-12 (CYANOCOBALAMIN) 1000 MCG tablet Take 1,000 mcg by mouth daily.     ezetimibe (ZETIA) 10 MG tablet Take 1 tablet (10 mg total) by mouth daily. 90 tablet 3   No current facility-administered medications for this visit.     Past Medical History:  Diagnosis Date   AAA (abdominal aortic aneurysm)    Hyperlipidemia    Hypertension    Hypothyroid    Nephrolithiasis    PUD (peptic ulcer disease)     Past Surgical History:  Procedure Laterality Date   CATARACT EXTRACTION, BILATERAL     INGUINAL HERNIA REPAIR Left    spine cyst     TONSILLECTOMY      Social History   Socioeconomic History   Marital status: Single    Spouse name: Not on file   Number of children: 3   Years of education: Not on file   Highest education level: Not on file  Occupational History   Not on file  Tobacco Use   Smoking status: Former    Packs/day: 1.00    Years: 45.00    Pack years: 45.00    Types: Cigarettes    Start date: 52    Quit date: 1977    Years since quitting: 46.1   Smokeless tobacco: Former  Quit date: 11/21/1975  Vaping Use   Vaping Use: Never used  Substance and Sexual Activity   Alcohol use: Yes    Comment: Occasional   Drug use: No   Sexual activity: Not on file  Other Topics Concern   Not on file  Social History Narrative   Not on file   Social Determinants of Health   Financial Resource Strain: Not on file  Food Insecurity: Not on file  Transportation Needs: Not on file  Physical Activity: Not on file  Stress: Not on file  Social Connections: Not on file  Intimate Partner Violence: Not on file    Family History  Problem Relation Age of Onset   AAA (abdominal aortic aneurysm) Father    AAA (abdominal aortic aneurysm) Brother    Alzheimer's disease Brother    Panic disorder Son     ROS: Difficulties with memory but no fevers or chills, productive cough, hemoptysis, dysphasia, odynophagia, melena, hematochezia, dysuria, hematuria,  rash, seizure activity, orthopnea, PND, pedal edema, claudication. Remaining systems are negative.  Physical Exam: Well-developed well-nourished in no acute distress.  Skin is warm and dry.  HEENT is normal.  Neck is supple.  Chest is clear to auscultation with normal expansion.  Cardiovascular exam is regular rate and rhythm.  Abdominal exam nontender or distended. No masses palpated. Extremities show no edema. neuro grossly intact  ECG-normal sinus rhythm at a rate of 69, left bundle branch block.  Personally reviewed  A/P  1 abdominal aortic aneurysm/iliac dilatation-arrange follow-up ultrasound March 2023.  2 coronary calcification-continue aspirin and statin.  He denies chest pain.  3 history of mild cardiomyopathy-continue ARB and beta-blocker.  Repeat echocardiogram.  If LV function significantly reduced would likely decrease amlodipine and increase losartan versus initiating Entresto.  4 hypertension-blood pressure controlled.  Continue present medical regimen.  Potassium and renal function monitored by primary care.  5 hyperlipidemia-continue statin.  Lipids and liver monitored by primary care.  Kirk Ruths, MD

## 2022-01-16 NOTE — Patient Instructions (Signed)
°  Testing/Procedures:  Your physician has requested that you have an echocardiogram. Echocardiography is a painless test that uses sound waves to create images of your heart. It provides your doctor with information about the size and shape of your heart and how well your hearts chambers and valves are working. This procedure takes approximately one hour. There are no restrictions for this procedure. [address]  Your physician has requested that you have an abdominal aorta duplex. During this test, an ultrasound is used to evaluate the aorta. Allow 30 minutes for this exam. Do not eat after midnight the day before and avoid carbonated beverages [facility]   Follow-Up: At [facility], you and your health needs are our priority.  As part of our continuing mission to provide you with exceptional heart care, we have created designated Provider Care Teams.  These Care Teams include your primary Cardiologist (physician) and Advanced Practice Providers (APPs -  Physician Assistants and Nurse Practitioners) who all work together to provide you with the care you need, when you need it.  We recommend signing up for the patient portal called "MyChart".  Sign up information is provided on this After Visit Summary.  MyChart is used to connect with patients for Virtual Visits (Telemedicine).  Patients are able to view lab/test results, encounter notes, upcoming appointments, etc.  Non-urgent messages can be sent to your provider as well.   To learn more about what you can do with MyChart, go to [email or web address].    Your next appointment:   12 month(s)  The format for your next appointment:   In Person  Provider:   Olga Millers, MD

## 2022-01-16 NOTE — Telephone Encounter (Signed)
We received a new referral from [facility] regarding a 3 month follow up for this patient. States he was supposed to have an appointment January 2023. I called and LVM for [place] at [facility] advising that patient did have a January appointment scheduled, but called in and got it moved sooner to a November appointment. At that appointment, he was referred to [facility] for neuropsych testing but it looks like they were unable to contact him.  Asked for a call back regarding this as no new symptoms or concerns were mentioned in the referral notes.

## 2022-01-17 ENCOUNTER — Other Ambulatory Visit: Payer: Self-pay | Admitting: Cardiology

## 2022-01-25 ENCOUNTER — Telehealth: Payer: Self-pay | Admitting: Psychiatry

## 2022-01-25 NOTE — Telephone Encounter (Signed)
Contacted pt son back, is listed as alternative contact. Informed him the referral was placed in Nov during last visit and notes state multi contacted and no answer from pt. [facility] number was provided to son to reach out to them for scheduling. Son was appreciative and advised to contact office back  with concerns.  ?

## 2022-01-25 NOTE — Telephone Encounter (Signed)
Pt's son, Emil Stahlnecker (not on [place]) discuss pt getting memory test last office visit. Would like a call from the nurse to discuss physician ordering pt a memory test.  ?

## 2022-01-26 ENCOUNTER — Other Ambulatory Visit: Payer: Self-pay

## 2022-01-26 ENCOUNTER — Ambulatory Visit
Admission: RE | Admit: 2022-01-26 | Discharge: 2022-01-26 | Disposition: A | Discharge status: Home / Self Care | Payer: PPO | Source: Ambulatory Visit | Attending: Family Medicine | Admitting: Family Medicine

## 2022-01-26 DIAGNOSIS — I700 Atherosclerosis of aorta: Secondary | ICD-10-CM | POA: Diagnosis not present

## 2022-01-26 DIAGNOSIS — R911 Solitary pulmonary nodule: Secondary | ICD-10-CM

## 2022-02-01 ENCOUNTER — Other Ambulatory Visit: Payer: Self-pay | Admitting: Psychiatry

## 2022-02-01 ENCOUNTER — Telehealth: Payer: Self-pay | Admitting: Psychiatry

## 2022-02-01 DIAGNOSIS — R413 Other amnesia: Secondary | ICD-10-CM

## 2022-02-01 NOTE — Telephone Encounter (Signed)
Need a new referral placed for Neurology for this patient. According to [facility] the referral has surpassed 90 days and they need a new one in order to see the patient. ?

## 2022-02-02 ENCOUNTER — Other Ambulatory Visit: Payer: Self-pay

## 2022-02-02 ENCOUNTER — Ambulatory Visit (HOSPITAL_COMMUNITY): Payer: PPO | Attending: Cardiology

## 2022-02-02 DIAGNOSIS — E039 Hypothyroidism, unspecified: Secondary | ICD-10-CM | POA: Insufficient documentation

## 2022-02-02 DIAGNOSIS — I361 Nonrheumatic tricuspid (valve) insufficiency: Secondary | ICD-10-CM

## 2022-02-02 DIAGNOSIS — E785 Hyperlipidemia, unspecified: Secondary | ICD-10-CM | POA: Insufficient documentation

## 2022-02-02 DIAGNOSIS — I3481 Nonrheumatic mitral (valve) annulus calcification: Secondary | ICD-10-CM | POA: Diagnosis not present

## 2022-02-02 DIAGNOSIS — I714 Abdominal aortic aneurysm, without rupture, unspecified: Secondary | ICD-10-CM | POA: Diagnosis not present

## 2022-02-02 DIAGNOSIS — I10 Essential (primary) hypertension: Secondary | ICD-10-CM | POA: Diagnosis not present

## 2022-02-02 DIAGNOSIS — Q231 Congenital insufficiency of aortic valve: Secondary | ICD-10-CM | POA: Insufficient documentation

## 2022-02-02 DIAGNOSIS — I429 Cardiomyopathy, unspecified: Secondary | ICD-10-CM | POA: Insufficient documentation

## 2022-02-02 LAB — ECHOCARDIOGRAM COMPLETE
AR max vel: 2.58 cm2
AV Area VTI: 2.68 cm2
AV Area mean vel: 2.44 cm2
AV Mean grad: 6 mmHg
AV Peak grad: 11.4 mmHg
Ao pk vel: 1.69 m/s
Area-P 1/2: 3.08 cm2
S' Lateral: 3.2 cm

## 2022-02-02 NOTE — Telephone Encounter (Signed)
Referral sent to [facility] [phone]. ?

## 2022-02-03 ENCOUNTER — Encounter: Payer: Self-pay | Admitting: Psychology

## 2022-02-06 ENCOUNTER — Encounter: Payer: Self-pay | Admitting: *Deleted

## 2022-02-08 ENCOUNTER — Other Ambulatory Visit: Payer: Self-pay | Admitting: Cardiology

## 2022-02-08 DIAGNOSIS — E7800 Pure hypercholesterolemia, unspecified: Secondary | ICD-10-CM

## 2022-02-15 ENCOUNTER — Other Ambulatory Visit: Payer: Self-pay | Admitting: Psychiatry

## 2022-02-15 DIAGNOSIS — R413 Other amnesia: Secondary | ICD-10-CM

## 2022-02-15 NOTE — Telephone Encounter (Signed)
I put in a new referral, thanks

## 2022-02-15 NOTE — Telephone Encounter (Signed)
son reporting pt scheduled for Nov. With [place]  he is asking if there is somewhere else he can possibly be seen earlier.   ? ?Can you put a another referral in an I can try [facility].  ?

## 2022-02-15 NOTE — Telephone Encounter (Signed)
Referral sent to [facility] [phone]. ?

## 2022-02-20 NOTE — Progress Notes (Signed)
?HISTORY AND PHYSICAL  ? ? ? ?CC:  follow up. ?Requesting Provider:  Pearson Grippe, MD ? ?HPI: This is a 88 y.o. male who is here today for follow up for AAA.   ? ?Pt was last seen 01/26/2021 and at that time, he was doing well without back or abdominal pain, claudication, rest pain or non healing wounds.  He was compliant with his asa/statin.   ? ?He is followed regularly by his Cardiologist Dr. Jens Som for mild cardiomyopathy and CAD. ? ?The pt returns today for follow up and here with his daughter.   He states he has been doing well.  He denies any abdominal or back pain or claudication or non healing wounds.  He states he quit smoking when cigarettes went to 79 cents / pack.   ? ?The pt is on a statin for cholesterol management.    ?The pt is on an aspirin.    Other AC:  none ?The pt is on CCB, BB, ARB, diuretic for hypertension.  ?The pt does not have diabetes. ?Tobacco hx:  former ? ?Pt does not have family hx of AAA. ? ?Past Medical History:  ?Diagnosis Date  ? AAA (abdominal aortic aneurysm)   ? Hyperlipidemia   ? Hypertension   ? Hypothyroid   ? Nephrolithiasis   ? PUD (peptic ulcer disease)   ? ? ?Past Surgical History:  ?Procedure Laterality Date  ? CATARACT EXTRACTION, BILATERAL    ? INGUINAL HERNIA REPAIR Left   ? spine cyst    ? TONSILLECTOMY    ? ? ?Allergies  ?Allergen Reactions  ? Losartan Potassium   ?  Other reaction(s): cough  ? Monopril [Fosinopril] Other (See Comments)  ?  Unspecified ?  ? Valium [Diazepam] Other (See Comments)  ?  unspecified  ? ? ?Current Outpatient Medications  ?Medication Sig Dispense Refill  ? amLODipine (NORVASC) 5 MG tablet Take 1 tablet by mouth daily.    ? aspirin 81 MG tablet Take 81 mg by mouth daily.    ? cetirizine (ZYRTEC) 10 MG tablet 1 tablet    ? escitalopram (LEXAPRO) 5 MG tablet Take 1 tablet by mouth daily.    ? ezetimibe (ZETIA) 10 MG tablet TAKE 1 TABLET BY MOUTH DAILY 90 tablet 3  ? fluticasone (FLONASE) 50 MCG/ACT nasal spray Place into both nostrils.    ?  hydrochlorothiazide (MICROZIDE) 12.5 MG capsule Take 12.5 mg by mouth daily.    ? ibuprofen (ADVIL,MOTRIN) 200 MG tablet Take 200 mg by mouth every 6 (six) hours as needed.    ? ipratropium (ATROVENT) 0.06 % nasal spray 2 sprays in each nostril    ? levothyroxine (SYNTHROID) 125 MCG tablet Take 125 mcg by mouth daily.    ? losartan (COZAAR) 50 MG tablet TAKE 1 TABLET BY MOUTH DAILY 90 tablet 3  ? meclizine (ANTIVERT) 25 MG tablet Take 25 mg by mouth 3 (three) times daily as needed for dizziness.    ? meloxicam (MOBIC) 15 MG tablet Take 15 mg by mouth daily.    ? metoprolol succinate (TOPROL XL) 25 MG 24 hr tablet Take 1 tablet (25 mg total) by mouth daily. 90 tablet 3  ? Multiple Vitamin (MULTIVITAMIN) capsule Take 1 capsule by mouth daily.    ? psyllium (METAMUCIL) 58.6 % packet Take 1 packet by mouth daily.    ? rosuvastatin (CRESTOR) 20 MG tablet TAKE 1 TABLET BY MOUTH DAILY 30 tablet 10  ? Saw Palmetto 450 MG CAPS Take 1 capsule  by mouth daily.    ? sildenafil (REVATIO) 20 MG tablet 1-5 tablets as needed    ? vitamin B-12 (CYANOCOBALAMIN) 1000 MCG tablet Take 1,000 mcg by mouth daily.    ? ?No current facility-administered medications for this visit.  ? ? ?Family History  ?Problem Relation Age of Onset  ? AAA (abdominal aortic aneurysm) Father   ? AAA (abdominal aortic aneurysm) Brother   ? Alzheimer's disease Brother   ? Panic disorder Son   ? ? ?Social History  ? ?Socioeconomic History  ? Marital status: Single  ?  Spouse name: Not on file  ? Number of children: 3  ? Years of education: Not on file  ? Highest education level: Not on file  ?Occupational History  ? Not on file  ?Tobacco Use  ? Smoking status: Former  ?  Packs/day: 1.00  ?  Years: 45.00  ?  Pack years: 45.00  ?  Types: Cigarettes  ?  Start date: 70  ?  Quit date: 34  ?  Years since quitting: 46.2  ? Smokeless tobacco: Former  ?  Quit date: 11/21/1975  ?Vaping Use  ? Vaping Use: Never used  ?Substance and Sexual Activity  ? Alcohol use: Yes  ?   Comment: Occasional  ? Drug use: No  ? Sexual activity: Not on file  ?Other Topics Concern  ? Not on file  ?Social History Narrative  ? Not on file  ? ?Social Determinants of Health  ? ?Financial Resource Strain: Not on file  ?Food Insecurity: Not on file  ?Transportation Needs: Not on file  ?Physical Activity: Not on file  ?Stress: Not on file  ?Social Connections: Not on file  ?Intimate Partner Violence: Not on file  ? ? ? ?REVIEW OF SYSTEMS:  ? ?[X]  denotes positive finding, [ ]  denotes negative finding ?Cardiac  Comments:  ?Chest pain or chest pressure:    ?Shortness of breath upon exertion:    ?Short of breath when lying flat:    ?Irregular heart rhythm:    ?    ?Vascular    ?Pain in calf, thigh, or hip brought on by ambulation:    ?Pain in feet at night that wakes you up from your sleep:     ?Blood clot in your veins:    ?Leg swelling:     ?    ?Pulmonary    ?Oxygen at home:    ?Productive cough:     ?Wheezing:     ?    ?Neurologic    ?Sudden weakness in arms or legs:     ?Sudden numbness in arms or legs:     ?Sudden onset of difficulty speaking or slurred speech:    ?Temporary loss of vision in one eye:     ?Problems with dizziness:     ?    ?Gastrointestinal    ?Blood in stool:     ?Vomited blood:     ?    ?Genitourinary    ?Burning when urinating:     ?Blood in urine:    ?    ?Psychiatric    ?Major depression:     ?    ?Hematologic    ?Bleeding problems:    ?Problems with blood clotting too easily:    ?    ?Skin    ?Rashes or ulcers:    ?    ?Constitutional    ?Fever or chills:    ? ? ?PHYSICAL EXAMINATION: ? ?Today's Vitals  ? 02/21/22  [id number]  ?BP: 128/67  ?Pulse: 69  ?Resp: 18  ?Temp: 97.9 ?F (36.6 ?C)  ?TempSrc: Temporal  ?SpO2: 94%  ?Weight: 190 lb 11.2 oz (86.5 kg)  ?Height: 5\' 10"  (1.778 m)  ?PainSc: 0-No pain  ? ?Body mass index is 27.36 kg/m?. ? ? ?General:  WDWN in NAD; vital signs documented above ?Gait: Not observed ?HENT: WNL, normocephalic ?Pulmonary: normal non-labored breathing , without  wheezing ?Cardiac: regular HR, without  Murmur; without carotid bruits ?Abdomen: soft, NT, no masses; aortic pulse is not palpable ?Skin: without rashes ?Vascular Exam/Pulses: ?Difficulty palpating femoral pulses; pedal pulses and popliteal pulses are not palpable ?Extremities: without ischemic changes, without Gangrene , without cellulitis; without open wounds;  ?Musculoskeletal: no muscle wasting or atrophy  ?Neurologic: A&O X 3;  No focal weakness or paresthesias are detected ?Psychiatric:  The pt has Normal affect. ? ? ?Non-Invasive Vascular Imaging:   ?AAA Arterial duplex on 02/21/2022: ?Abdominal Aorta Findings:  ?+-----------+-------+----------+----------+--------+--------+--------+  ?Location   AP (cm)Trans (cm)PSV (cm/s)WaveformThrombusComments  ?+-----------+-------+----------+----------+--------+--------+--------+  ?Proximal   2.40   2.76      67                                  ?+-----------+-------+----------+----------+--------+--------+--------+  ?Mid        4.30   4.56      37                                  ?+-----------+-------+----------+----------+--------+--------+--------+  ?Distal     2.40   2.76      45                                  ?+-----------+-------+----------+----------+--------+--------+--------+  ?RT CIA Prox1.9    1.9       45                                  ?+-----------+-------+----------+----------+--------+--------+--------+  ?LT CIA Prox2.1    2.3       45                                  ?+-----------+-------+----------+----------+--------+--------+--------+  ? ?Summary:  ?Abdominal Aorta: There is evidence of abnormal dilatation of the mid Abdominal aorta. There is evidence of abnormal dilation of the Right Common Iliac artery and Left Common Iliac artery. The largest aortic measurement is 4.6 cm. The largest aortic diameter remains essentially unchanged compared to prior exam. Previous diameter measurement was 4.6 cm.   ? ?Previous AAA arterial duplex on 01/26/2021: ?Abdominal Aorta Findings:  ?+-----------+-------+----------+----------+--------+--------+--------+  ?Location   AP (cm)Trans (cm)PSV (cm/s)WaveformThrombusComments  ?+-----------+-------+----------+----------+-----

## 2022-02-21 ENCOUNTER — Encounter: Payer: Self-pay | Admitting: *Deleted

## 2022-02-21 ENCOUNTER — Ambulatory Visit (HOSPITAL_COMMUNITY)
Admission: RE | Admit: 2022-02-21 | Discharge: 2022-02-21 | Disposition: A | Discharge status: Home / Self Care | Payer: PPO | Source: Ambulatory Visit | Attending: Vascular Surgery | Admitting: Vascular Surgery

## 2022-02-21 ENCOUNTER — Encounter: Payer: Self-pay | Admitting: Physician Assistant

## 2022-02-21 ENCOUNTER — Ambulatory Visit: Payer: PPO | Admitting: Physician Assistant

## 2022-02-21 VITALS — BP 128/67 | HR 69 | Temp 97.9°F | Resp 18 | Ht 70.0 in | Wt 190.7 lb

## 2022-02-21 DIAGNOSIS — I714 Abdominal aortic aneurysm, without rupture, unspecified: Secondary | ICD-10-CM

## 2022-02-21 DIAGNOSIS — R413 Other amnesia: Secondary | ICD-10-CM | POA: Insufficient documentation

## 2022-02-21 DIAGNOSIS — M179 Osteoarthritis of knee, unspecified: Secondary | ICD-10-CM | POA: Insufficient documentation

## 2022-02-27 NOTE — Telephone Encounter (Signed)
Patient is scheduled at [facility] for Dr. Cecelia Byars ? ?Intake: 03/06/22 ?Testing: 03/06/22 ?Feedback: 03/29/22.  ?

## 2022-03-06 DIAGNOSIS — R413 Other amnesia: Secondary | ICD-10-CM | POA: Diagnosis not present

## 2022-03-16 DIAGNOSIS — M1711 Unilateral primary osteoarthritis, right knee: Secondary | ICD-10-CM | POA: Diagnosis not present

## 2022-03-16 DIAGNOSIS — M25561 Pain in right knee: Secondary | ICD-10-CM | POA: Diagnosis not present

## 2022-03-29 DIAGNOSIS — R413 Other amnesia: Secondary | ICD-10-CM | POA: Diagnosis not present

## 2022-05-08 DIAGNOSIS — M1711 Unilateral primary osteoarthritis, right knee: Secondary | ICD-10-CM | POA: Diagnosis not present

## 2022-05-08 DIAGNOSIS — M25561 Pain in right knee: Secondary | ICD-10-CM | POA: Diagnosis not present

## 2022-05-14 ENCOUNTER — Emergency Department (HOSPITAL_BASED_OUTPATIENT_CLINIC_OR_DEPARTMENT_OTHER): Payer: PPO

## 2022-05-14 ENCOUNTER — Inpatient Hospital Stay (HOSPITAL_BASED_OUTPATIENT_CLINIC_OR_DEPARTMENT_OTHER)
Admission: EM | Admit: 2022-05-14 | Discharge: 2022-05-16 | DRG: 379 | Disposition: A | Discharge status: Home / Self Care | Payer: PPO | Attending: Internal Medicine | Admitting: Internal Medicine

## 2022-05-14 ENCOUNTER — Encounter (HOSPITAL_BASED_OUTPATIENT_CLINIC_OR_DEPARTMENT_OTHER): Payer: Self-pay | Admitting: Emergency Medicine

## 2022-05-14 ENCOUNTER — Other Ambulatory Visit: Payer: Self-pay

## 2022-05-14 DIAGNOSIS — I714 Abdominal aortic aneurysm, without rupture, unspecified: Secondary | ICD-10-CM | POA: Diagnosis present

## 2022-05-14 DIAGNOSIS — K644 Residual hemorrhoidal skin tags: Secondary | ICD-10-CM | POA: Diagnosis present

## 2022-05-14 DIAGNOSIS — K5731 Diverticulosis of large intestine without perforation or abscess with bleeding: Secondary | ICD-10-CM | POA: Diagnosis not present

## 2022-05-14 DIAGNOSIS — N400 Enlarged prostate without lower urinary tract symptoms: Secondary | ICD-10-CM | POA: Diagnosis not present

## 2022-05-14 DIAGNOSIS — Z79899 Other long term (current) drug therapy: Secondary | ICD-10-CM

## 2022-05-14 DIAGNOSIS — Z791 Long term (current) use of non-steroidal anti-inflammatories (NSAID): Secondary | ICD-10-CM

## 2022-05-14 DIAGNOSIS — K922 Gastrointestinal hemorrhage, unspecified: Secondary | ICD-10-CM | POA: Diagnosis not present

## 2022-05-14 DIAGNOSIS — K625 Hemorrhage of anus and rectum: Secondary | ICD-10-CM | POA: Diagnosis present

## 2022-05-14 DIAGNOSIS — Z7982 Long term (current) use of aspirin: Secondary | ICD-10-CM

## 2022-05-14 DIAGNOSIS — N200 Calculus of kidney: Secondary | ICD-10-CM | POA: Diagnosis not present

## 2022-05-14 DIAGNOSIS — I10 Essential (primary) hypertension: Secondary | ICD-10-CM | POA: Diagnosis not present

## 2022-05-14 DIAGNOSIS — K5733 Diverticulitis of large intestine without perforation or abscess with bleeding: Principal | ICD-10-CM | POA: Diagnosis present

## 2022-05-14 DIAGNOSIS — E039 Hypothyroidism, unspecified: Secondary | ICD-10-CM | POA: Diagnosis not present

## 2022-05-14 DIAGNOSIS — Z888 Allergy status to other drugs, medicaments and biological substances status: Secondary | ICD-10-CM

## 2022-05-14 DIAGNOSIS — K5730 Diverticulosis of large intestine without perforation or abscess without bleeding: Secondary | ICD-10-CM | POA: Diagnosis not present

## 2022-05-14 DIAGNOSIS — Z79890 Hormone replacement therapy (postmenopausal): Secondary | ICD-10-CM

## 2022-05-14 DIAGNOSIS — E785 Hyperlipidemia, unspecified: Secondary | ICD-10-CM | POA: Diagnosis present

## 2022-05-14 DIAGNOSIS — K5792 Diverticulitis of intestine, part unspecified, without perforation or abscess without bleeding: Secondary | ICD-10-CM | POA: Diagnosis present

## 2022-05-14 DIAGNOSIS — Z87891 Personal history of nicotine dependence: Secondary | ICD-10-CM

## 2022-05-14 DIAGNOSIS — Z820 Family history of epilepsy and other diseases of the nervous system: Secondary | ICD-10-CM

## 2022-05-14 DIAGNOSIS — N1832 Chronic kidney disease, stage 3b: Secondary | ICD-10-CM | POA: Diagnosis not present

## 2022-05-14 DIAGNOSIS — N281 Cyst of kidney, acquired: Secondary | ICD-10-CM | POA: Diagnosis not present

## 2022-05-14 DIAGNOSIS — I129 Hypertensive chronic kidney disease with stage 1 through stage 4 chronic kidney disease, or unspecified chronic kidney disease: Secondary | ICD-10-CM | POA: Diagnosis present

## 2022-05-14 DIAGNOSIS — K5732 Diverticulitis of large intestine without perforation or abscess without bleeding: Secondary | ICD-10-CM | POA: Diagnosis not present

## 2022-05-14 LAB — CBC
HCT: 46.3 % (ref 39.0–52.0)
HCT: 47.2 % (ref 39.0–52.0)
Hemoglobin: 15.7 g/dL (ref 13.0–17.0)
Hemoglobin: 15.6 g/dL (ref 13.0–17.0)
MCH: 31.9 pg (ref 26.0–34.0)
MCH: 31.3 pg (ref 26.0–34.0)
MCHC: 33.9 g/dL (ref 30.0–36.0)
MCHC: 33.1 g/dL (ref 30.0–36.0)
MCV: 94.1 fL (ref 80.0–100.0)
MCV: 94.8 fL (ref 80.0–100.0)
Platelets: 218 10*3/uL (ref 150–400)
Platelets: 200 10*3/uL (ref 150–400)
RBC: 4.92 MIL/uL (ref 4.22–5.81)
RBC: 4.98 MIL/uL (ref 4.22–5.81)
RDW: 13.3 % (ref 11.5–15.5)
RDW: 13.4 % (ref 11.5–15.5)
WBC: 15 10*3/uL — ABNORMAL HIGH (ref 4.0–10.5)
WBC: 15.1 10*3/uL — ABNORMAL HIGH (ref 4.0–10.5)
nRBC: 0 % (ref 0.0–0.2)
nRBC: 0 % (ref 0.0–0.2)

## 2022-05-14 LAB — BASIC METABOLIC PANEL
Anion gap: 5 (ref 5–15)
BUN: 21 mg/dL (ref 8–23)
CO2: 28 mmol/L (ref 22–32)
Calcium: 8.9 mg/dL (ref 8.9–10.3)
Chloride: 104 mmol/L (ref 98–111)
Creatinine, Ser: 0.93 mg/dL (ref 0.61–1.24)
GFR, Estimated: >60 mL/min (ref >60)
Glucose, Bld: 118 mg/dL — ABNORMAL HIGH (ref 70–99)
Potassium: 4.2 mmol/L (ref 3.5–5.1)
Sodium: 137 mmol/L (ref 135–145)

## 2022-05-14 LAB — OCCULT BLOOD X 1 CARD TO LAB, STOOL: Fecal Occult Bld: POSITIVE — AB

## 2022-05-14 MED ORDER — IOHEXOL 300 MG/ML  SOLN
100.0000 mL | Freq: Once | INTRAMUSCULAR | Status: AC | PRN
  Administered 2022-05-14: 100 mL via INTRAVENOUS

## 2022-05-14 MED ORDER — ONDANSETRON HCL 4 MG PO TABS: 4.0000 mg | ORAL_TABLET | Freq: Four times a day (QID) | ORAL | Status: DC | PRN

## 2022-05-14 MED ORDER — CEFEPIME HCL 2 G IV SOLR
2.0000 g | Freq: Once | INTRAVENOUS | Status: AC
  Administered 2022-05-14: 2 g via INTRAVENOUS
  Filled 2022-05-14: qty 12.5

## 2022-05-14 MED ORDER — MORPHINE SULFATE (PF) 2 MG/ML IV SOLN: 2.0000 mg | INTRAVENOUS | Status: DC | PRN

## 2022-05-14 MED ORDER — METRONIDAZOLE 500 MG/100ML IV SOLN
500.0000 mg | Freq: Two times a day (BID) | INTRAVENOUS | Status: DC
  Administered 2022-05-15 – 2022-05-16 (×3): 500 mg via INTRAVENOUS
  Filled 2022-05-14 (×3): qty 100

## 2022-05-14 MED ORDER — SODIUM CHLORIDE 0.9 % IV SOLN: INTRAVENOUS | Status: DC | PRN

## 2022-05-14 MED ORDER — CIPROFLOXACIN IN D5W 400 MG/200ML IV SOLN
400.0000 mg | Freq: Two times a day (BID) | INTRAVENOUS | Status: DC
  Administered 2022-05-14 – 2022-05-15 (×2): 400 mg via INTRAVENOUS
  Filled 2022-05-14 (×2): qty 200

## 2022-05-14 MED ORDER — DEXTROSE IN LACTATED RINGERS 5 % IV SOLN: INTRAVENOUS | Status: DC

## 2022-05-14 MED ORDER — ONDANSETRON HCL 4 MG/2ML IJ SOLN: 4.0000 mg | Freq: Four times a day (QID) | INTRAMUSCULAR | Status: DC | PRN

## 2022-05-14 MED ORDER — METRONIDAZOLE 500 MG/100ML IV SOLN
500.0000 mg | Freq: Once | INTRAVENOUS | Status: AC
  Administered 2022-05-14: 500 mg via INTRAVENOUS
  Filled 2022-05-14: qty 100

## 2022-05-14 MED ORDER — ORAL CARE MOUTH RINSE: 15.0000 mL | OROMUCOSAL | Status: DC | PRN

## 2022-05-14 NOTE — ED Provider Notes (Addendum)
[facility] EMERGENCY DEPARTMENT Provider Note   CSN: [id number] Arrival date & time: 05/14/22  1147     History  Chief Complaint  Patient presents with   Rectal Bleeding    Brandon Valdez is a 88 y.o. male.   Rectal Bleeding  Patient has a history of hypertension, hyperlipidemia, peptic ulcer disease, aortic aneurysm, kidney stones as well as hemorrhoids who presents with complaints of rectal bleeding.  Patient states he has noticed bright red blood mixed with stool since yesterday.  It is a small amount but this morning he noticed it again so he came to the emergency room.  He is not having any complaints of abdominal pain.  Does not have any fevers or chills.  He denies any rectal pain.    Home Medications Prior to Admission medications   Medication Sig Start Date End Date Taking? Authorizing Provider  amLODipine (NORVASC) 5 MG tablet Take 1 tablet by mouth daily.    [provider]  aspirin 81 MG tablet Take 81 mg by mouth daily.    [provider]  cetirizine (ZYRTEC) 10 MG tablet 1 tablet 03/25/20   [provider]  escitalopram (LEXAPRO) 5 MG tablet Take 1 tablet by mouth daily.    [provider]  ezetimibe (ZETIA) 10 MG tablet TAKE 1 TABLET BY MOUTH DAILY 02/08/22   Lewayne Bunting, MD  fluticasone [facility]) 50 MCG/ACT nasal spray Place into both nostrils. 03/25/20   [provider]  hydrochlorothiazide (MICROZIDE) 12.5 MG capsule Take 12.5 mg by mouth daily.    [provider]  ibuprofen (ADVIL,MOTRIN) 200 MG tablet Take 200 mg by mouth every 6 (six) hours as needed.    [provider]  ipratropium (ATROVENT) 0.06 % nasal spray 2 sprays in each nostril 01/02/19   [provider]  levothyroxine (SYNTHROID) 125 MCG tablet Take 125 mcg by mouth daily. 10/27/19   [provider]  losartan (COZAAR) 50 MG tablet TAKE 1 TABLET BY MOUTH DAILY 05/10/21   Parke Poisson, MD  meclizine  (ANTIVERT) 25 MG tablet Take 25 mg by mouth 3 (three) times daily as needed for dizziness.    [provider]  meloxicam (MOBIC) 15 MG tablet Take 15 mg by mouth daily. 11/06/19   [provider]  metoprolol succinate (TOPROL XL) 25 MG 24 hr tablet Take 1 tablet (25 mg total) by mouth daily. 01/12/20   Lewayne Bunting, MD  Multiple Vitamin (MULTIVITAMIN) capsule Take 1 capsule by mouth daily.    [provider]  psyllium (METAMUCIL) 58.6 % packet Take 1 packet by mouth daily.    [provider]  rosuvastatin (CRESTOR) 20 MG tablet TAKE 1 TABLET BY MOUTH DAILY 01/17/22   Lewayne Bunting, MD  Saw Palmetto 450 MG CAPS Take 1 capsule by mouth daily.    [provider]  sildenafil (REVATIO) 20 MG tablet 1-5 tablets as needed    [provider]  vitamin B-12 (CYANOCOBALAMIN) 1000 MCG tablet Take 1,000 mcg by mouth daily.    [provider]      Allergies    Losartan potassium, Monopril [fosinopril], and Valium [diazepam]    Review of Systems   Review of Systems  Gastrointestinal:  Positive for hematochezia.    Physical Exam Updated Vital Signs BP (!) 159/81   Pulse 76   Temp 98.4 F (36.9 C) (Oral)   Resp 17   Ht 1.905 m (6\' 3" )   Wt 81.6  kg   SpO2 96%   BMI 22.50 kg/m  Physical Exam Vitals and nursing note reviewed.  Constitutional:      General: He is not in acute distress.    Appearance: He is well-developed.  HENT:     Head: Normocephalic and atraumatic.     Right Ear: External ear normal.     Left Ear: External ear normal.  Eyes:     General: No scleral icterus.       Right eye: No discharge.        Left eye: No discharge.     Conjunctiva/sclera: Conjunctivae normal.  Neck:     Trachea: No tracheal deviation.  Cardiovascular:     Rate and Rhythm: Normal rate and regular rhythm.  Pulmonary:     Effort: Pulmonary effort is normal. No respiratory distress.     Breath sounds: Normal breath sounds. No  stridor. No wheezing or rales.  Abdominal:     General: Bowel sounds are normal. There is no distension.     Palpations: Abdomen is soft.     Tenderness: There is no abdominal tenderness. There is no guarding or rebound.  Genitourinary:    Comments: External hemorrhoids noted but no evidence of thrombosed or bleeding hemorrhoid, no gross blood noted on rectal exam Musculoskeletal:        General: No tenderness or deformity.     Cervical back: Neck supple.  Skin:    General: Skin is warm and dry.     Findings: No rash.  Neurological:     General: No focal deficit present.     Mental Status: He is alert.     Cranial Nerves: No cranial nerve deficit (no facial droop, extraocular movements intact, no slurred speech).     Sensory: No sensory deficit.     Motor: No abnormal muscle tone or seizure activity.     Coordination: Coordination normal.  Psychiatric:        Mood and Affect: Mood normal.     ED Results / Procedures / Treatments   Labs (all labs ordered are listed, but only abnormal results are displayed) Labs Reviewed  CBC - Abnormal; Notable for the following components:      Result Value   WBC 15.0 (*)    All other components within normal limits  BASIC METABOLIC PANEL - Abnormal; Notable for the following components:   Glucose, Bld 118 (*)    All other components within normal limits  OCCULT BLOOD X 1 CARD TO LAB, STOOL - Abnormal; Notable for the following components:   Fecal Occult Bld POSITIVE (*)    All other components within normal limits    EKG None  Radiology CT ABDOMEN PELVIS W CONTRAST  Result Date: 05/14/2022 CLINICAL DATA:  Left lower quadrant abdominal pain, bright red blood with bowel movements EXAM: CT ABDOMEN AND PELVIS WITH CONTRAST TECHNIQUE: Multidetector CT imaging of the abdomen and pelvis was performed using the standard protocol following bolus administration of intravenous contrast. RADIATION DOSE REDUCTION: This exam was performed according  to the departmental dose-optimization program which includes automated exposure control, adjustment of the mA and/or kV according to patient size and/or use of iterative reconstruction technique. CONTRAST:  OMNIPAQUE IOHEXOL 300 MG/ML  SOLN COMPARISON:  11/12/2015 FINDINGS: Lower chest: No acute abnormality.  Coronary artery calcifications. Hepatobiliary: No solid liver abnormality is seen. No gallstones, gallbladder wall thickening, or biliary dilatation. Pancreas: Unremarkable. No pancreatic ductal dilatation or surrounding inflammatory changes. Spleen: Normal in size without  significant abnormality. Adrenals/Urinary Tract: Adrenal glands are unremarkable. Multiple bilateral simple, benign renal cortical cysts, for which no further follow-up or characterization is required. Small nonobstructive calculus of the inferior pole of left kidney. No right-sided calculi, ureteral calculi, or hydronephrosis. Bladder is unremarkable. Stomach/Bowel: Stomach is within normal limits. Appendix appears normal. Pancolonic diverticulosis, severe in the descending and sigmoid colon. Wall thickening and fat stranding about the mid to distal descending colon (series 6, image 38). Vascular/Lymphatic: Aortic atherosclerosis. Infrarenal abdominal aortic aneurysm with a large burden of eccentric mural thrombus measuring 5.0 x 4.3 cm, previously 3.6 x 3.6 cm. No enlarged abdominal or pelvic lymph nodes. Reproductive: Mild prostatomegaly. Other: Small, fat containing left inguinal hernia.  No ascites. Musculoskeletal: No acute or significant osseous findings. IMPRESSION: 1. Pancolonic diverticulosis, severe in the descending and sigmoid colon. Wall thickening and fat stranding about the mid to distal descending colon, consistent with acute diverticulitis. No evidence of complicating perforation or abscess. 2. Infrarenal abdominal aortic aneurysm with a large burden of eccentric mural thrombus measuring 5.0 x 4.3 cm, previously 3.6 x  3.6 cm. Recommend follow-up CT/MR every 6 months and vascular consultation. This recommendation follows ACR consensus guidelines: White Paper of the ACR Incidental Findings Committee II on Vascular Findings. J Am Coll Radiol 2013; 10:789-794. Aortic Atherosclerosis (ICD10-I70.0). Electronically Signed   By: Jearld Lesch M.D.   On: 05/14/2022 14:16    Procedures Procedures    Medications Ordered in ED Medications  ceFEPIme (MAXIPIME) 2 g in sodium chloride 0.9 % 100 mL IVPB (2 g Intravenous New Bag/Given 05/14/22 1446)    And  metroNIDAZOLE (FLAGYL) IVPB 500 mg (has no administration in time range)  0.9 %  sodium chloride infusion ( Intravenous New Bag/Given 05/14/22 1444)  iohexol (OMNIPAQUE) 300 MG/ML solution 100 mL (100 mLs Intravenous Contrast Given 05/14/22 1345)    ED Course/ Medical Decision Making/ A&P Clinical Course as of 05/14/22 1510  Sun May 14, 2022  1309 CBC(!) White blood cell count elevated 15 [JK]  1309 Basic metabolic panel(!) Metabolic panel normal [JK]  1324 Discussed findings with patient.  He was having some lower abdominal discomfort earlier.  Patient states when it first started he also felt very chilled.  With his white blood cell count we will proceed with CT to evaluate for possible diverticulitis [JK]  1423 CT ABDOMEN PELVIS W CONTRAST CT scan images and radiology report shows diffuse diverticulosis as well as acute diverticulitis.  No signs of abscess at this time [JK]    Clinical Course User Index [JK] Linwood Dibbles, MD                           Medical Decision Making Problems Addressed: Diverticulitis of large intestine without perforation or abscess with bleeding: acute illness or injury that poses a threat to life or bodily functions Lower GI bleed: acute illness or injury  Amount and/or Complexity of Data Reviewed Labs: ordered. Decision-making details documented in ED Course. Radiology: ordered. Decision-making details documented in ED  Course. Discussion of management or test interpretation with external provider(s): Case discussed with Dr Kerry Hough regarding admission  Risk Prescription drug management. Decision regarding hospitalization.   Patient presented to the ER for evaluation of bright red blood per rectum.  Patient also mention that he was having some issues with chills and some lower abdominal pain.  Patient's laboratory test did show significant leukocytosis.  Fortunately no signs of acute anemia.  No recurrent rectal  bleeding while he has been here however his CT scan does show evidence of significant diverticulosis as well as acute diverticulitis.  With his age and risk for recurrent bleeding I think it is reasonable to bring him in  in for IV antibiotics and monitoring.  I will consult with the medical service for transfer and admission.       Final Clinical Impression(s) / ED Diagnoses Final diagnoses:  Lower GI bleed  Diverticulitis of large intestine without perforation or abscess with bleeding    Rx / DC Orders ED Discharge Orders     None         Linwood Dibbles, MD 05/14/22 1511    Linwood Dibbles, MD 05/15/22 [phone]

## 2022-05-15 DIAGNOSIS — I10 Essential (primary) hypertension: Secondary | ICD-10-CM | POA: Diagnosis not present

## 2022-05-15 DIAGNOSIS — Z79890 Hormone replacement therapy (postmenopausal): Secondary | ICD-10-CM | POA: Diagnosis not present

## 2022-05-15 DIAGNOSIS — E785 Hyperlipidemia, unspecified: Secondary | ICD-10-CM | POA: Diagnosis present

## 2022-05-15 DIAGNOSIS — Z79899 Other long term (current) drug therapy: Secondary | ICD-10-CM | POA: Diagnosis not present

## 2022-05-15 DIAGNOSIS — K625 Hemorrhage of anus and rectum: Secondary | ICD-10-CM | POA: Diagnosis present

## 2022-05-15 DIAGNOSIS — I129 Hypertensive chronic kidney disease with stage 1 through stage 4 chronic kidney disease, or unspecified chronic kidney disease: Secondary | ICD-10-CM | POA: Diagnosis present

## 2022-05-15 DIAGNOSIS — E039 Hypothyroidism, unspecified: Secondary | ICD-10-CM | POA: Diagnosis present

## 2022-05-15 DIAGNOSIS — Z820 Family history of epilepsy and other diseases of the nervous system: Secondary | ICD-10-CM | POA: Diagnosis not present

## 2022-05-15 DIAGNOSIS — N1832 Chronic kidney disease, stage 3b: Secondary | ICD-10-CM | POA: Diagnosis present

## 2022-05-15 DIAGNOSIS — Z7982 Long term (current) use of aspirin: Secondary | ICD-10-CM | POA: Diagnosis not present

## 2022-05-15 DIAGNOSIS — Z888 Allergy status to other drugs, medicaments and biological substances status: Secondary | ICD-10-CM | POA: Diagnosis not present

## 2022-05-15 DIAGNOSIS — K644 Residual hemorrhoidal skin tags: Secondary | ICD-10-CM | POA: Diagnosis present

## 2022-05-15 DIAGNOSIS — Z791 Long term (current) use of non-steroidal anti-inflammatories (NSAID): Secondary | ICD-10-CM | POA: Diagnosis not present

## 2022-05-15 DIAGNOSIS — K5733 Diverticulitis of large intestine without perforation or abscess with bleeding: Secondary | ICD-10-CM

## 2022-05-15 DIAGNOSIS — N400 Enlarged prostate without lower urinary tract symptoms: Secondary | ICD-10-CM | POA: Diagnosis present

## 2022-05-15 DIAGNOSIS — Z87891 Personal history of nicotine dependence: Secondary | ICD-10-CM | POA: Diagnosis not present

## 2022-05-15 DIAGNOSIS — K5792 Diverticulitis of intestine, part unspecified, without perforation or abscess without bleeding: Secondary | ICD-10-CM | POA: Diagnosis present

## 2022-05-15 DIAGNOSIS — I714 Abdominal aortic aneurysm, without rupture, unspecified: Secondary | ICD-10-CM | POA: Diagnosis present

## 2022-05-15 LAB — CBC
HCT: 45.4 % (ref 39.0–52.0)
HCT: 44.1 % (ref 39.0–52.0)
Hemoglobin: 15 g/dL (ref 13.0–17.0)
Hemoglobin: 14.5 g/dL (ref 13.0–17.0)
MCH: 31.6 pg (ref 26.0–34.0)
MCH: 31.5 pg (ref 26.0–34.0)
MCHC: 33 g/dL (ref 30.0–36.0)
MCHC: 32.9 g/dL (ref 30.0–36.0)
MCV: 95.8 fL (ref 80.0–100.0)
MCV: 95.7 fL (ref 80.0–100.0)
Platelets: 182 10*3/uL (ref 150–400)
Platelets: 178 10*3/uL (ref 150–400)
RBC: 4.74 MIL/uL (ref 4.22–5.81)
RBC: 4.61 MIL/uL (ref 4.22–5.81)
RDW: 13.4 % (ref 11.5–15.5)
RDW: 13.5 % (ref 11.5–15.5)
WBC: 14.4 10*3/uL — ABNORMAL HIGH (ref 4.0–10.5)
WBC: 13 10*3/uL — ABNORMAL HIGH (ref 4.0–10.5)
nRBC: 0 % (ref 0.0–0.2)
nRBC: 0 % (ref 0.0–0.2)

## 2022-05-15 LAB — COMPREHENSIVE METABOLIC PANEL
ALT: 17 U/L (ref 0–44)
AST: 15 U/L (ref 15–41)
Albumin: 3.1 g/dL — ABNORMAL LOW (ref 3.5–5.0)
Alkaline Phosphatase: 43 U/L (ref 38–126)
Anion gap: 6 (ref 5–15)
BUN: 11 mg/dL (ref 8–23)
CO2: 26 mmol/L (ref 22–32)
Calcium: 8.3 mg/dL — ABNORMAL LOW (ref 8.9–10.3)
Chloride: 105 mmol/L (ref 98–111)
Creatinine, Ser: 0.78 mg/dL (ref 0.61–1.24)
GFR, Estimated: >60 mL/min (ref >60)
Glucose, Bld: 107 mg/dL — ABNORMAL HIGH (ref 70–99)
Potassium: 3.6 mmol/L (ref 3.5–5.1)
Sodium: 137 mmol/L (ref 135–145)
Total Bilirubin: 1 mg/dL (ref 0.3–1.2)
Total Protein: 5.7 g/dL — ABNORMAL LOW (ref 6.5–8.1)

## 2022-05-15 LAB — GLUCOSE, CAPILLARY: Glucose-Capillary: 102 mg/dL — ABNORMAL HIGH (ref 70–99)

## 2022-05-15 MED ORDER — SODIUM CHLORIDE 0.9 % IV SOLN
2.0000 g | INTRAVENOUS | Status: DC
  Administered 2022-05-15: 2 g via INTRAVENOUS
  Filled 2022-05-15: qty 20

## 2022-05-15 MED ORDER — MECLIZINE HCL 25 MG PO TABS: 25.0000 mg | ORAL_TABLET | Freq: Three times a day (TID) | ORAL | Status: DC | PRN

## 2022-05-15 MED ORDER — ESCITALOPRAM OXALATE 10 MG PO TABS
10.0000 mg | ORAL_TABLET | Freq: Every day | ORAL | Status: DC
  Administered 2022-05-15 – 2022-05-16 (×2): 10 mg via ORAL
  Filled 2022-05-15 (×2): qty 1

## 2022-05-15 MED ORDER — LEVOTHYROXINE SODIUM 125 MCG PO TABS
125.0000 ug | ORAL_TABLET | Freq: Every day | ORAL | Status: DC
  Administered 2022-05-15 – 2022-05-16 (×2): 125 ug via ORAL
  Filled 2022-05-15 (×2): qty 1

## 2022-05-15 NOTE — Progress Notes (Signed)
[facility] PROGRESS NOTE   Brandon Valdez WUJ:[id number] DOB: 1934/07/03 DOA: 05/14/2022  PCP: Pearson Grippe, MD  Brief History/Interval Summary: 88 y.o. male with medical history significant of abdominal aortic aneurysm, essential hypertension, lipidemia, hypothyroidism, nephrolithiasis, who presented to the ER with complaint of rectal bleed.  CT scan showed diverticulosis and evidence for diverticulitis.  Patient was hospitalized for further management.    Consultants: None  Procedures: None    Subjective/Interval History: Patient mentioned that he has not had any further episodes of rectal bleeding since last night.  Denies any abdominal pain per se.  No nausea or vomiting.    Assessment/Plan:  Hematochezia likely diverticular CT scan shows pancolonic diverticulosis.  Hemoglobin did not drop significantly.  Bleeding appears to be subsiding. Patient thinks he has had a colonoscopy within the last 10 years done by [facility] gastroenterology though no report is noted in the EMR.  Acute diverticulitis Diverticulitis was noted in the descending colon on the CT scan.  He did have elevated WBC.  He has been started on ciprofloxacin and Flagyl.  We will change ciprofloxacin to ceftriaxone.  There is no report of penicillin or cephalosporin allergy. Monitor WBC.  Keep him on clear liquid diet for now.  Essential hypertension Blood pressure is reasonably well controlled.  Home medication list reviewed.  He is supposed to be on amlodipine metoprolol and Cozaar.  All of these are on hold currently.  Dyslipidemia Crestor and Zetia are on hold.  History of BPH Stable.   Hypothyroidism Continue levothyroxine.  Chronic kidney disease stage IIIb Seems to be at baseline.  Abdominal aortic aneurysm Being monitored by cardiology.   DVT Prophylaxis: SCDs Code Status: Full code Family Communication: Discussed with patient Disposition Plan: Mobilize.  Anticipate discharge back  home when improved  Status is: Observation The patient will require care spanning > 2 midnights and should be moved to inpatient because: Acute diverticulitis, hematochezia, IV antibiotics      Medications: Scheduled: Continuous:  sodium chloride Stopped (05/14/22 1731)   ciprofloxacin 400 mg (05/15/22 0755)   dextrose 5% lactated ringers 100 mL/hr at 05/15/22 0539   metronidazole 100 mL/hr at 05/15/22 0413   NFA:OZHYQM chloride, morphine injection, ondansetron **OR** ondansetron (ZOFRAN) IV, mouth rinse  Antibiotics: Anti-infectives (From admission, onward)    Start     Dose/Rate Route Frequency Ordered Stop   05/15/22 0400  metroNIDAZOLE (FLAGYL) IVPB 500 mg        500 mg 100 mL/hr over 60 Minutes Intravenous Every 12 hours 05/14/22 1853     05/14/22 2000  ciprofloxacin (CIPRO) IVPB 400 mg        400 mg 200 mL/hr over 60 Minutes Intravenous Every 12 hours 05/14/22 1853     05/14/22 1430  ceFEPIme (MAXIPIME) 2 g in sodium chloride 0.9 % 100 mL IVPB       See Hyperspace for full Linked Orders Report.   2 g 200 mL/hr over 30 Minutes Intravenous  Once 05/14/22 1424 05/14/22 1516   05/14/22 1430  metroNIDAZOLE (FLAGYL) IVPB 500 mg       See Hyperspace for full Linked Orders Report.   500 mg 100 mL/hr over 60 Minutes Intravenous  Once 05/14/22 1424 05/14/22 1700       Objective:  Vital Signs  Vitals:   05/14/22 1829 05/14/22 2209 05/15/22 0248 05/15/22 0529  BP: (!) 153/85 (!) 146/82 113/66 124/87  Pulse: 73 83 75 70  Resp: 18 20 20 18   Temp: 97.8 F (  36.6 C) 98 F (36.7 C) 98 F (36.7 C) 98.6 F (37 C)  TempSrc: Oral   Oral  SpO2: 95% 95% 94% 96%  Weight: 86.5 kg     Height: 6\' 3"  (1.905 m)       Intake/Output Summary (Last 24 hours) at 05/15/2022 1046 Last data filed at 05/15/2022 0955 Gross per 24 hour  Intake 1207.98 ml  Output 150 ml  Net 1057.98 ml   Filed Weights   05/14/22 1206 05/14/22 1829  Weight: 81.6 kg 86.5 kg    General appearance:  Awake alert.  In no distress Resp: Clear to auscultation bilaterally.  Normal effort Cardio: S1-S2 is normal regular.  No S3-S4.  No rubs murmurs or bruit GI: Abdomen is soft.  Mildly tender in the left lower abdomen without any rebound rigidity or guarding.  Bowel sounds present. Extremities: No edema.  Full range of motion of lower extremities. Neurologic: No focal neurological deficits.    Lab Results:  Data Reviewed: I have personally reviewed following labs and reports of the imaging studies  CBC: Recent Labs  Lab 05/14/22 1239 05/14/22 1916 05/15/22 0049 05/15/22 0737  WBC 15.0* 15.1* 14.4* 13.0*  HGB 15.7 15.6 15.0 14.5  HCT 46.3 47.2 45.4 44.1  MCV 94.1 94.8 95.8 95.7  PLT 218 200 182 178    Basic Metabolic Panel: Recent Labs  Lab 05/14/22 1239 05/15/22 0737  NA 137 137  K 4.2 3.6  CL 104 105  CO2 28 26  GLUCOSE 118* 107*  BUN 21 11  CREATININE 0.93 0.78  CALCIUM 8.9 8.3*    GFR: Estimated Creatinine Clearance: 79.2 mL/min (by C-G formula based on SCr of 0.78 mg/dL).  Liver Function Tests: Recent Labs  Lab 05/15/22 0737  AST 15  ALT 17  ALKPHOS 43  BILITOT 1.0  PROT 5.7*  ALBUMIN 3.1*      Radiology Studies: CT ABDOMEN PELVIS W CONTRAST  Result Date: 05/14/2022 CLINICAL DATA:  Left lower quadrant abdominal pain, bright red blood with bowel movements EXAM: CT ABDOMEN AND PELVIS WITH CONTRAST TECHNIQUE: Multidetector CT imaging of the abdomen and pelvis was performed using the standard protocol following bolus administration of intravenous contrast. RADIATION DOSE REDUCTION: This exam was performed according to the departmental dose-optimization program which includes automated exposure control, adjustment of the mA and/or kV according to patient size and/or use of iterative reconstruction technique. CONTRAST:  OMNIPAQUE IOHEXOL 300 MG/ML  SOLN COMPARISON:  11/12/2015 FINDINGS: Lower chest: No acute abnormality.  Coronary artery calcifications.  Hepatobiliary: No solid liver abnormality is seen. No gallstones, gallbladder wall thickening, or biliary dilatation. Pancreas: Unremarkable. No pancreatic ductal dilatation or surrounding inflammatory changes. Spleen: Normal in size without significant abnormality. Adrenals/Urinary Tract: Adrenal glands are unremarkable. Multiple bilateral simple, benign renal cortical cysts, for which no further follow-up or characterization is required. Small nonobstructive calculus of the inferior pole of left kidney. No right-sided calculi, ureteral calculi, or hydronephrosis. Bladder is unremarkable. Stomach/Bowel: Stomach is within normal limits. Appendix appears normal. Pancolonic diverticulosis, severe in the descending and sigmoid colon. Wall thickening and fat stranding about the mid to distal descending colon (series 6, image 38). Vascular/Lymphatic: Aortic atherosclerosis. Infrarenal abdominal aortic aneurysm with a large burden of eccentric mural thrombus measuring 5.0 x 4.3 cm, previously 3.6 x 3.6 cm. No enlarged abdominal or pelvic lymph nodes. Reproductive: Mild prostatomegaly. Other: Small, fat containing left inguinal hernia.  No ascites. Musculoskeletal: No acute or significant osseous findings. IMPRESSION: 1. Pancolonic diverticulosis, severe in  the descending and sigmoid colon. Wall thickening and fat stranding about the mid to distal descending colon, consistent with acute diverticulitis. No evidence of complicating perforation or abscess. 2. Infrarenal abdominal aortic aneurysm with a large burden of eccentric mural thrombus measuring 5.0 x 4.3 cm, previously 3.6 x 3.6 cm. Recommend follow-up CT/MR every 6 months and vascular consultation. This recommendation follows ACR consensus guidelines: White Paper of the ACR Incidental Findings Committee II on Vascular Findings. J Am Coll Radiol 2013; 10:789-794. Aortic Atherosclerosis (ICD10-I70.0). Electronically Signed   By: Jearld Lesch M.D.   On: 05/14/2022  14:16       LOS: 0 days   Brandon Valdez  [facility] Pager on [email or web address]  05/15/2022, 10:46 AM

## 2022-05-16 DIAGNOSIS — K625 Hemorrhage of anus and rectum: Secondary | ICD-10-CM | POA: Diagnosis not present

## 2022-05-16 LAB — CBC
HCT: 41.6 % (ref 39.0–52.0)
Hemoglobin: 13.4 g/dL (ref 13.0–17.0)
MCH: 31.3 pg (ref 26.0–34.0)
MCHC: 32.2 g/dL (ref 30.0–36.0)
MCV: 97.2 fL (ref 80.0–100.0)
Platelets: 155 10*3/uL (ref 150–400)
RBC: 4.28 MIL/uL (ref 4.22–5.81)
RDW: 13.4 % (ref 11.5–15.5)
WBC: 8.3 10*3/uL (ref 4.0–10.5)
nRBC: 0 % (ref 0.0–0.2)

## 2022-05-16 LAB — BASIC METABOLIC PANEL
Anion gap: 5 (ref 5–15)
BUN: 7 mg/dL — ABNORMAL LOW (ref 8–23)
CO2: 27 mmol/L (ref 22–32)
Calcium: 8.2 mg/dL — ABNORMAL LOW (ref 8.9–10.3)
Chloride: 106 mmol/L (ref 98–111)
Creatinine, Ser: 0.67 mg/dL (ref 0.61–1.24)
GFR, Estimated: >60 mL/min (ref >60)
Glucose, Bld: 109 mg/dL — ABNORMAL HIGH (ref 70–99)
Potassium: 3.7 mmol/L (ref 3.5–5.1)
Sodium: 138 mmol/L (ref 135–145)

## 2022-05-16 MED ORDER — AMOXICILLIN-POT CLAVULANATE 875-125 MG PO TABS: 1.0000 | ORAL_TABLET | Freq: Two times a day (BID) | ORAL | 0 refills | Status: AC

## 2022-05-16 MED ORDER — AMOXICILLIN-POT CLAVULANATE 875-125 MG PO TABS
1.0000 | ORAL_TABLET | Freq: Two times a day (BID) | ORAL | Status: DC
  Administered 2022-05-16: 1 via ORAL
  Filled 2022-05-16: qty 1

## 2022-05-16 MED ORDER — ASPIRIN 81 MG PO TABS: 81.0000 mg | ORAL_TABLET | Freq: Every day | ORAL | Status: AC

## 2022-05-16 NOTE — Discharge Summary (Signed)
[facility]  Physician Discharge Summary   Patient ID: Brandon Valdez MRN: [id number] DOB/AGE: [id number] 86 y.o.  Admit date: 05/14/2022 Discharge date:   05/16/2022   PCP: Fatima Sanger, FNP  DISCHARGE DIAGNOSES:  Principal Problem:   Rectal bleeding Active Problems:   AAA (abdominal aortic aneurysm) without rupture (HCC)   Benign prostatic hyperplasia   Chronic kidney disease due to hypertension   Essential hypertension   Hyperlipidemia   Hypothyroidism   Acute diverticulitis   RECOMMENDATIONS FOR OUTPATIENT FOLLOW UP: Follow-up with PCP in 1 week to discuss referral to gastroenterology Resume aspirin after 1 week   Home Health: None Equipment/Devices: None  CODE STATUS: Full code  DISCHARGE CONDITION: fair  Diet recommendation: Heart healthy as before  INITIAL HISTORY: 88 y.o. male with medical history significant of abdominal aortic aneurysm, essential hypertension, lipidemia, hypothyroidism, nephrolithiasis, who presented to the ER with complaint of rectal bleed.  CT scan showed diverticulosis and evidence for diverticulitis.  Patient was hospitalized for further management.      HOSPITAL COURSE:   Hematochezia likely diverticular CT scan shows pancolonic diverticulosis.  Hemoglobin did not drop significantly.  Bleeding is subsided.  Did not require consultation with gastroenterology in the hospital. Patient thinks he has had a colonoscopy within the last 10 years done by [facility] gastroenterology though no report is noted in the EMR.  He was encouraged to discuss this with his primary care provider and consider referral to gastroenterology if he has not had a colonoscopy within the last 10 years.   Acute diverticulitis Diverticulitis was noted in the descending colon on the CT scan.  He did have elevated WBC.  He was treated with IV antibiotics.  Will be switched over to Augmentin at discharge.  WBC was elevated at presentation and noted to be  normal today.  Feels much better.  Diet to be advanced today.  He wants to go home later today if possible.  Essential hypertension Resume home medications  Dyslipidemia Crestor and Zetia are on hold.  History of BPH Stable.    Hypothyroidism Continue levothyroxine.   Chronic kidney disease stage IIIb Seems to be at baseline.  Abdominal aortic aneurysm Being monitored by cardiology.   Patient is doing well.  Feels better.  Bleeding has subsided.  Wants to go home today.  He will be advanced to soft diet.  If he tolerates this diet he should be able to go home later today.   PERTINENT LABS:  The results of significant diagnostics from this hospitalization (including imaging, microbiology, ancillary and laboratory) are listed below for reference.      Labs:   Basic Metabolic Panel: Recent Labs  Lab 05/14/22 1239 05/15/22 0737 05/16/22 0512  NA 137 137 138  K 4.2 3.6 3.7  CL 104 105 106  CO2 28 26 27   GLUCOSE 118* 107* 109*  BUN 21 11 7*  CREATININE 0.93 0.78 0.67  CALCIUM 8.9 8.3* 8.2*   Liver Function Tests: Recent Labs  Lab 05/15/22 0737  AST 15  ALT 17  ALKPHOS 43  BILITOT 1.0  PROT 5.7*  ALBUMIN 3.1*    CBC: Recent Labs  Lab 05/14/22 1239 05/14/22 1916 05/15/22 0049 05/15/22 0737 05/16/22 0512  WBC 15.0* 15.1* 14.4* 13.0* 8.3  HGB 15.7 15.6 15.0 14.5 13.4  HCT 46.3 47.2 45.4 44.1 41.6  MCV 94.1 94.8 95.8 95.7 97.2  PLT 218 200 182 178 155     CBG: Recent Labs  Lab 05/15/22 2356  GLUCAP 102*     IMAGING STUDIES CT ABDOMEN PELVIS W CONTRAST  Result Date: 05/14/2022 CLINICAL DATA:  Left lower quadrant abdominal pain, bright red blood with bowel movements EXAM: CT ABDOMEN AND PELVIS WITH CONTRAST TECHNIQUE: Multidetector CT imaging of the abdomen and pelvis was performed using the standard protocol following bolus administration of intravenous contrast. RADIATION DOSE REDUCTION: This exam was performed according to the departmental  dose-optimization program which includes automated exposure control, adjustment of the mA and/or kV according to patient size and/or use of iterative reconstruction technique. CONTRAST:  OMNIPAQUE IOHEXOL 300 MG/ML  SOLN COMPARISON:  11/12/2015 FINDINGS: Lower chest: No acute abnormality.  Coronary artery calcifications. Hepatobiliary: No solid liver abnormality is seen. No gallstones, gallbladder wall thickening, or biliary dilatation. Pancreas: Unremarkable. No pancreatic ductal dilatation or surrounding inflammatory changes. Spleen: Normal in size without significant abnormality. Adrenals/Urinary Tract: Adrenal glands are unremarkable. Multiple bilateral simple, benign renal cortical cysts, for which no further follow-up or characterization is required. Small nonobstructive calculus of the inferior pole of left kidney. No right-sided calculi, ureteral calculi, or hydronephrosis. Bladder is unremarkable. Stomach/Bowel: Stomach is within normal limits. Appendix appears normal. Pancolonic diverticulosis, severe in the descending and sigmoid colon. Wall thickening and fat stranding about the mid to distal descending colon (series 6, image 38). Vascular/Lymphatic: Aortic atherosclerosis. Infrarenal abdominal aortic aneurysm with a large burden of eccentric mural thrombus measuring 5.0 x 4.3 cm, previously 3.6 x 3.6 cm. No enlarged abdominal or pelvic lymph nodes. Reproductive: Mild prostatomegaly. Other: Small, fat containing left inguinal hernia.  No ascites. Musculoskeletal: No acute or significant osseous findings. IMPRESSION: 1. Pancolonic diverticulosis, severe in the descending and sigmoid colon. Wall thickening and fat stranding about the mid to distal descending colon, consistent with acute diverticulitis. No evidence of complicating perforation or abscess. 2. Infrarenal abdominal aortic aneurysm with a large burden of eccentric mural thrombus measuring 5.0 x 4.3 cm, previously 3.6 x 3.6 cm. Recommend  follow-up CT/MR every 6 months and vascular consultation. This recommendation follows ACR consensus guidelines: White Paper of the ACR Incidental Findings Committee II on Vascular Findings. J Am Coll Radiol 2013; 10:789-794. Aortic Atherosclerosis (ICD10-I70.0). Electronically Signed   By: Jearld Lesch M.D.   On: 05/14/2022 14:16    DISCHARGE EXAMINATION: Vitals:   05/15/22 0529 05/15/22 1354 05/15/22 2023 05/16/22 0535  BP: 124/87 125/69 129/79 129/76  Pulse: 70 72 66 65  Resp: 18 18 (!) 24 18  Temp: 98.6 F (37 C) 98.3 F (36.8 C) 98.3 F (36.8 C) 97.8 F (36.6 C)  TempSrc: Oral Oral Oral Oral  SpO2: 96% 97% 97% 95%  Weight:      Height:       General appearance: Awake alert.  In no distress Resp: Clear to auscultation bilaterally.  Normal effort Cardio: S1-S2 is normal regular.  No S3-S4.  No rubs murmurs or bruit GI: Abdomen is soft.  Nontender nondistended.  Bowel sounds are present normal.  No masses organomegaly    DISPOSITION: Home later today if he tolerates his diet  Discharge Instructions     Call MD for:  difficulty breathing, headache or visual disturbances   Complete by: As directed    Call MD for:  extreme fatigue   Complete by: As directed    Call MD for:  persistant dizziness or light-headedness   Complete by: As directed    Call MD for:  persistant nausea and vomiting   Complete by: As directed    Call MD for:  severe uncontrolled pain   Complete by: As directed    Call MD for:  temperature >100.4   Complete by: As directed    Diet - low sodium heart healthy   Complete by: As directed    Discharge instructions   Complete by: As directed    Please be sure to follow-up with your primary care provider in 1 week.  Discuss with them regarding referral to gastroenterology in the next few weeks.  Avoid constipation.  Seek attention if bleeding recurs.  You were cared for by a hospitalist during your hospital stay. If you have any questions about your  discharge medications or the care you received while you were in the hospital after you are discharged, you can call the unit and asked to speak with the hospitalist on call if the hospitalist that took care of you is not available. Once you are discharged, your primary care physician will handle any further medical issues. Please note that NO REFILLS for any discharge medications will be authorized once you are discharged, as it is imperative that you return to your primary care physician (or establish a relationship with a primary care physician if you do not have one) for your aftercare needs so that they can reassess your need for medications and monitor your lab values. If you do not have a primary care physician, you can call [phone] for a physician referral.   Increase activity slowly   Complete by: As directed          Allergies as of 05/16/2022       Reactions   Losartan Potassium    Other reaction(s): cough   Monopril [fosinopril] Other (See Comments)   Unspecified   Valium [diazepam] Other (See Comments)   unspecified        Medication List     STOP taking these medications    meloxicam 15 MG tablet Commonly known as: MOBIC       TAKE these medications    amLODipine 5 MG tablet Commonly known as: NORVASC Take 1 tablet by mouth daily.   amoxicillin-clavulanate 875-125 MG tablet Commonly known as: AUGMENTIN Take 1 tablet by mouth every 12 (twelve) hours for 8 days.   aspirin 81 MG tablet Take 1 tablet (81 mg total) by mouth daily. May resume after 1 week on July 4 Start taking on: May 23, 2022 What changed:  additional instructions These instructions start on May 23, 2022. If you are unsure what to do until then, ask your doctor or other care provider.   cetirizine 10 MG tablet Commonly known as: ZYRTEC Take 10 mg by mouth daily as needed for allergies.   escitalopram 10 MG tablet Commonly known as: LEXAPRO Take 10 mg by mouth daily.   ezetimibe 10  MG tablet Commonly known as: ZETIA TAKE 1 TABLET BY MOUTH DAILY   fluticasone 50 MCG/ACT nasal spray Commonly known as: FLONASE Place 1 spray into both nostrils daily as needed for allergies.   ibuprofen 200 MG tablet Commonly known as: ADVIL Take 200 mg by mouth every 6 (six) hours as needed for moderate pain.   ipratropium 0.06 % nasal spray Commonly known as: ATROVENT 2 sprays daily as needed (allergies).   levothyroxine 125 MCG tablet Commonly known as: SYNTHROID Take 125 mcg by mouth daily.   losartan 50 MG tablet Commonly known as: COZAAR TAKE 1 TABLET BY MOUTH DAILY   meclizine 25 MG tablet Commonly known as: ANTIVERT Take 25 mg by mouth 3 (  three) times daily as needed for dizziness.   metoprolol succinate 25 MG 24 hr tablet Commonly known as: Toprol XL Take 1 tablet (25 mg total) by mouth daily.   multivitamin capsule Take 1 capsule by mouth daily.   psyllium 58.6 % packet Commonly known as: METAMUCIL Take 1 packet by mouth daily.   rosuvastatin 20 MG tablet Commonly known as: CRESTOR TAKE 1 TABLET BY MOUTH DAILY   Saw Palmetto 450 MG Caps Take 1 capsule by mouth daily.   vitamin B-12 1000 MCG tablet Commonly known as: CYANOCOBALAMIN Take 1,000 mcg by mouth daily.          Follow-up Information     Fatima Sanger, FNP Follow up in 1 week(s).   Specialty: Internal Medicine Contact information: [address] [place] [address] [phone]                 TOTAL DISCHARGE TIME: 35 minutes  Fate Galanti Rito Ehrlich  [facility] Pager on [email or web address]  05/16/2022, 11:41 AM

## 2022-05-24 DIAGNOSIS — K5733 Diverticulitis of large intestine without perforation or abscess with bleeding: Secondary | ICD-10-CM | POA: Diagnosis not present

## 2022-05-24 DIAGNOSIS — K625 Hemorrhage of anus and rectum: Secondary | ICD-10-CM | POA: Diagnosis not present

## 2022-05-24 DIAGNOSIS — R42 Dizziness and giddiness: Secondary | ICD-10-CM | POA: Diagnosis not present

## 2022-06-01 DIAGNOSIS — Z09 Encounter for follow-up examination after completed treatment for conditions other than malignant neoplasm: Secondary | ICD-10-CM | POA: Diagnosis not present

## 2022-06-01 DIAGNOSIS — K921 Melena: Secondary | ICD-10-CM | POA: Diagnosis not present

## 2022-06-01 DIAGNOSIS — F329 Major depressive disorder, single episode, unspecified: Secondary | ICD-10-CM | POA: Diagnosis not present

## 2022-06-01 DIAGNOSIS — K5792 Diverticulitis of intestine, part unspecified, without perforation or abscess without bleeding: Secondary | ICD-10-CM | POA: Diagnosis not present

## 2022-06-01 DIAGNOSIS — R413 Other amnesia: Secondary | ICD-10-CM | POA: Diagnosis not present

## 2022-06-01 DIAGNOSIS — K5730 Diverticulosis of large intestine without perforation or abscess without bleeding: Secondary | ICD-10-CM | POA: Diagnosis not present

## 2022-06-01 DIAGNOSIS — E785 Hyperlipidemia, unspecified: Secondary | ICD-10-CM | POA: Diagnosis not present

## 2022-07-18 DIAGNOSIS — S0990XA Unspecified injury of head, initial encounter: Secondary | ICD-10-CM | POA: Diagnosis not present

## 2022-07-18 DIAGNOSIS — S0512XA Contusion of eyeball and orbital tissues, left eye, initial encounter: Secondary | ICD-10-CM | POA: Diagnosis not present

## 2022-07-18 DIAGNOSIS — Z9181 History of falling: Secondary | ICD-10-CM | POA: Diagnosis not present

## 2022-07-18 DIAGNOSIS — S0003XA Contusion of scalp, initial encounter: Secondary | ICD-10-CM | POA: Diagnosis not present

## 2022-07-21 DIAGNOSIS — Z9181 History of falling: Secondary | ICD-10-CM | POA: Diagnosis not present

## 2022-07-21 DIAGNOSIS — S0512XA Contusion of eyeball and orbital tissues, left eye, initial encounter: Secondary | ICD-10-CM | POA: Diagnosis not present

## 2022-07-21 DIAGNOSIS — S0990XA Unspecified injury of head, initial encounter: Secondary | ICD-10-CM | POA: Diagnosis not present

## 2022-07-26 DIAGNOSIS — M1711 Unilateral primary osteoarthritis, right knee: Secondary | ICD-10-CM | POA: Diagnosis not present

## 2022-07-26 DIAGNOSIS — M25561 Pain in right knee: Secondary | ICD-10-CM | POA: Diagnosis not present

## 2022-09-03 ENCOUNTER — Emergency Department (HOSPITAL_BASED_OUTPATIENT_CLINIC_OR_DEPARTMENT_OTHER)
Admission: EM | Admit: 2022-09-03 | Discharge: 2022-09-03 | Disposition: A | Discharge status: Home / Self Care | Payer: PPO | Attending: Emergency Medicine | Admitting: Emergency Medicine

## 2022-09-03 ENCOUNTER — Other Ambulatory Visit: Payer: Self-pay

## 2022-09-03 ENCOUNTER — Emergency Department (HOSPITAL_BASED_OUTPATIENT_CLINIC_OR_DEPARTMENT_OTHER): Payer: PPO

## 2022-09-03 DIAGNOSIS — E039 Hypothyroidism, unspecified: Secondary | ICD-10-CM | POA: Diagnosis not present

## 2022-09-03 DIAGNOSIS — M436 Torticollis: Secondary | ICD-10-CM | POA: Insufficient documentation

## 2022-09-03 DIAGNOSIS — M2578 Osteophyte, vertebrae: Secondary | ICD-10-CM | POA: Diagnosis not present

## 2022-09-03 DIAGNOSIS — I10 Essential (primary) hypertension: Secondary | ICD-10-CM | POA: Diagnosis not present

## 2022-09-03 DIAGNOSIS — Z79899 Other long term (current) drug therapy: Secondary | ICD-10-CM | POA: Diagnosis not present

## 2022-09-03 DIAGNOSIS — Z7982 Long term (current) use of aspirin: Secondary | ICD-10-CM | POA: Diagnosis not present

## 2022-09-03 DIAGNOSIS — M542 Cervicalgia: Secondary | ICD-10-CM

## 2022-09-03 DIAGNOSIS — M47812 Spondylosis without myelopathy or radiculopathy, cervical region: Secondary | ICD-10-CM | POA: Diagnosis not present

## 2022-09-03 MED ORDER — HYDROCODONE-ACETAMINOPHEN 5-325 MG PO TABS: 1.0000 | ORAL_TABLET | Freq: Four times a day (QID) | ORAL | 0 refills | Status: DC | PRN

## 2022-09-03 MED ORDER — OXYCODONE-ACETAMINOPHEN 5-325 MG PO TABS
1.0000 | ORAL_TABLET | ORAL | Status: DC | PRN
  Administered 2022-09-03: 1 via ORAL
  Filled 2022-09-03: qty 1

## 2022-09-03 MED ORDER — CYCLOBENZAPRINE HCL 10 MG PO TABS: 10.0000 mg | ORAL_TABLET | Freq: Three times a day (TID) | ORAL | 0 refills | Status: DC | PRN

## 2022-09-03 MED ORDER — HYDROCODONE-ACETAMINOPHEN 5-325 MG PO TABS: 1.0000 | ORAL_TABLET | Freq: Once | ORAL | Status: DC

## 2022-09-03 NOTE — Discharge Instructions (Addendum)
Take the muscle relaxer and the pain medicines as directed.  Make an appointment follow-up with sports medicine.  CT scan without any acute findings.

## 2022-09-03 NOTE — ED Provider Notes (Addendum)
[facility] Provider Note   CSN: [id number] Arrival date & time: 09/03/22  1932     History  Chief Complaint  Patient presents with   Neck Pain    Brandon Valdez is a 88 y.o. male.  Patient with a fall the end of August followed up by [facility].  Had CT head at that time.  Patient has not had any neck pain since that that fall.  But about 3 days ago or maybe 2 days ago had some mild neck discomfort first thing in the morning got much worse today.  Patient has some memory issues so they are not completely sure whether there was a new injury or not.  Pain does not radiate into the left arm and has no numbness or weakness to the left arm.  No other complaints.  Denies any fevers.  Says very hard to move his head towards the left.  Very painful to do that.  Past medical history significant for hypertension hyperlipidemia and abdominal aortic aneurysm, and hypothyroidism.       Home Medications Prior to Admission medications   Medication Sig Start Date End Date Taking? Authorizing Provider  amLODipine (NORVASC) 5 MG tablet Take 1 tablet by mouth daily.    [provider]  aspirin 81 MG tablet Take 1 tablet (81 mg total) by mouth daily. May resume after 1 week on July 4 05/23/22   Bonnielee Haff, MD  cetirizine (ZYRTEC) 10 MG tablet Take 10 mg by mouth daily as needed for allergies. 03/25/20   [provider]  escitalopram (LEXAPRO) 10 MG tablet Take 10 mg by mouth daily. 05/12/22   [provider]  ezetimibe (ZETIA) 10 MG tablet TAKE 1 TABLET BY MOUTH DAILY 02/08/22   Lelon Perla, MD  fluticasone [facility]) 50 MCG/ACT nasal spray Place 1 spray into both nostrils daily as needed for allergies. 03/25/20   [provider]  ibuprofen (ADVIL,MOTRIN) 200 MG tablet Take 200 mg by mouth every 6 (six) hours as needed for moderate pain.    [provider]  ipratropium (ATROVENT) 0.06 % nasal spray 2  sprays daily as needed (allergies). 01/02/19   [provider]  levothyroxine (SYNTHROID) 125 MCG tablet Take 125 mcg by mouth daily. 10/27/19   [provider]  losartan (COZAAR) 50 MG tablet TAKE 1 TABLET BY MOUTH DAILY 05/10/21   Elouise Munroe, MD  meclizine (ANTIVERT) 25 MG tablet Take 25 mg by mouth 3 (three) times daily as needed for dizziness.    [provider]  metoprolol succinate (TOPROL XL) 25 MG 24 hr tablet Take 1 tablet (25 mg total) by mouth daily. Patient not taking: Reported on 05/14/2022 01/12/20   Lelon Perla, MD  Multiple Vitamin (MULTIVITAMIN) capsule Take 1 capsule by mouth daily.    [provider]  psyllium (METAMUCIL) 58.6 % packet Take 1 packet by mouth daily.    [provider]  rosuvastatin (CRESTOR) 20 MG tablet TAKE 1 TABLET BY MOUTH DAILY 01/17/22   Lelon Perla, MD  Saw Palmetto 450 MG CAPS Take 1 capsule by mouth daily.    [provider]  vitamin B-12 (CYANOCOBALAMIN) 1000 MCG tablet Take 1,000 mcg by mouth daily.    [provider]      Allergies    Losartan potassium, Monopril [fosinopril], and Valium [diazepam]    Review of Systems   Review of Systems  Unable to perform ROS: Dementia  Musculoskeletal:  Positive for neck pain.    Physical Exam Updated Vital Signs BP (!) 148/86 (BP Location: Right Arm)   Pulse 78   Temp 98.2 F (36.8 C) (Oral)   Resp 18   Ht 1.854 m (6\' 1" )   Wt 83.9 kg   SpO2 95%   BMI 24.41 kg/m  Physical Exam Vitals and nursing note reviewed.  Constitutional:      General: He is not in acute distress.    Appearance: Normal appearance. He is well-developed.  HENT:     Head: Normocephalic and atraumatic.     Mouth/Throat:     Mouth: Mucous membranes are moist.  Eyes:     Extraocular Movements: Extraocular movements intact.     Conjunctiva/sclera: Conjunctivae normal.     Pupils: Pupils are equal, round, and reactive to light.  Neck:      Comments: Sternocleidomastoid muscle spasm on the left side.  Very difficult to turn his head to the left.  No posterior tenderness.  Turns his head better to the right but that causes some discomfort no tenderness to palpation on the right side.  No evidence of any bruising or trauma. Cardiovascular:     Rate and Rhythm: Normal rate and regular rhythm.     Heart sounds: No murmur heard. Pulmonary:     Effort: Pulmonary effort is normal. No respiratory distress.     Breath sounds: Normal breath sounds.  Abdominal:     Palpations: Abdomen is soft.     Tenderness: There is no abdominal tenderness.  Musculoskeletal:        General: No swelling.     Cervical back: Rigidity and tenderness present.  Skin:    General: Skin is warm and dry.     Capillary Refill: Capillary refill takes less than 2 seconds.  Neurological:     General: No focal deficit present.     Mental Status: He is alert. Mental status is at baseline.  Psychiatric:        Mood and Affect: Mood normal.     ED Results / Procedures / Treatments   Labs (all labs ordered are listed, but only abnormal results are displayed) Labs Reviewed - No data to display  EKG None  Radiology No results found.  Procedures Procedures    Medications Ordered in ED Medications  oxyCODONE-acetaminophen (PERCOCET/ROXICET) 5-325 MG per tablet 1 tablet (1 tablet Oral Given 09/03/22 2008)    ED Course/ Medical Decision Making/ A&P                           Medical Decision Making Amount and/or Complexity of Data Reviewed Radiology: ordered.  Risk Prescription drug management.   Clinically seems to be consistent with torticollis.  But will get CT cervical spine just in case since there is some memory issues to make sure there was no fall or injury more recently related to this.  Do not feel that the fall back in the end of August has any connection whatsoever.  Patient without any neurodeficit to the upper extremities.   Particular the left upper extremity radial pulses 2+ good cap refill sensation intact good movement at the left upper extremity the shoulder elbow and wrist and fingers.  CT cervical spine with multiple levels of degenerative changes without any acute abnormalities.  No evidence of any acute fractures.  We will we will treat symptomatically and have patient follow-up with sports medicine.   Final Clinical Impression(s) /  ED Diagnoses Final diagnoses:  Neck pain  Torticollis    Rx / DC Orders ED Discharge Orders     None         Fredia Sorrow, MD 09/03/22 1157    Fredia Sorrow, MD 09/03/22 [phone]

## 2022-09-03 NOTE — ED Notes (Signed)
Pt transported to CT ?

## 2022-09-03 NOTE — ED Triage Notes (Signed)
PT reports falling 8/28 and getting scanned after fall. Workup came back negative. Pt starting having pain in left side of neck yesterday while sitting at home. Denies new injury. Pain has progressively worsened. Denies fever, N/V, HA.

## 2022-09-03 NOTE — ED Notes (Signed)
D/c paperwork reviewed with pt, including prescriptions.  All questions addressed prior to d/c. Pt ambulatory to ED exit with son, NAD.

## 2022-10-04 ENCOUNTER — Encounter: Payer: PPO | Admitting: Psychology

## 2022-10-09 NOTE — Progress Notes (Unsigned)
   CC:  memory loss  Follow-up Visit  Last visit: 10/10/21  Brief HPI: 88 year old male with a history of HLD, hypothyroidism, MDD, AAA, CAD, cardiomyopathy who follows in clinic for memory loss following COVID infection in 2020. MRI brain 09/06/21 with mild generalized atrophy.  At his last visit he was referred for neuropsychological testing.  Interval History: Memory***Neuropsych***   Physical Exam:   Vital Signs: There were no vitals taken for this visit. GENERAL:  well appearing, in no acute distress, alert  SKIN:  Color, texture, turgor normal. No rashes or lesions HEAD:  Normocephalic/atraumatic. RESP: normal respiratory effort MSK:  No gross joint deformities.   NEUROLOGICAL: Mental Status: Alert, oriented to person, place and time, Follows commands, and Speech fluent and appropriate. Cranial Nerves: PERRL, face symmetric, no dysarthria, hearing grossly intact Motor: moves all extremities equally Gait: normal-based.  IMPRESSION: ***  PLAN: ***   Follow-up: ***  I spent a total of *** minutes on the date of the service. Discussed medication side effects, adverse reactions and drug interactions. Written educational materials and patient instructions outlining all of the above were given.  Ocie Doyne, MD

## 2022-10-10 ENCOUNTER — Encounter: Payer: Self-pay | Admitting: Psychiatry

## 2022-10-10 ENCOUNTER — Ambulatory Visit: Payer: PPO | Admitting: Psychiatry

## 2022-10-10 VITALS — BP 137/88 | HR 88 | Ht 73.0 in | Wt 185.0 lb

## 2022-10-10 DIAGNOSIS — F0280 Dementia in other diseases classified elsewhere without behavioral disturbance: Secondary | ICD-10-CM

## 2022-10-10 DIAGNOSIS — G309 Alzheimer's disease, unspecified: Secondary | ICD-10-CM | POA: Diagnosis not present

## 2022-10-10 MED ORDER — DONEPEZIL HCL 10 MG PO TABS: 10.0000 mg | ORAL_TABLET | Freq: Every day | ORAL | 5 refills | Status: DC

## 2022-10-10 MED ORDER — DONEPEZIL HCL 5 MG PO TABS: 5.0000 mg | ORAL_TABLET | Freq: Every day | ORAL | 0 refills | Status: DC

## 2022-10-10 NOTE — Patient Instructions (Addendum)
DEMENTIA OVERVIEW "Dementia" is a general term for when a person has developed difficulties with reasoning, judgment, and memory. People who have dementia usually have some memory loss as well as difficulty in at least one other area, such as: ?Speaking or writing coherently (or understanding what is said or written) ?Recognizing familiar surroundings ?Planning and carrying out complex or multi-step tasks In order to be considered dementia these issues must be severe enough to interfere with a person's independence and daily activities. Dementia can be caused by several diseases that affect the brain. The most common cause is Alzheimer disease. Alzheimer disease is present in approximately 60 to 80 percent of all cases of dementia; other degenerative and/or vascular diseases may be present as well, particularly as a person gets older.   DEMENTIA RISK FACTORS There is no way to predict with certainty who will develop dementia. Each form of dementia has its own risk factors, but most forms have several risk factors in common. Age -- The biggest risk factor for dementia is age: dementia is rare in people younger than 60 years and becomes very common in people older than 80. For example, dementia affects approximately one in six people between 80 and 85 years old, one in three above 85 years, and almost half of people over age 90.  Family history -- Some forms of dementia have a genetic component, meaning that they tend to run in families. Having a close family member with Alzheimer disease increases your chances of developing it. People with a first-degree relative (parent or sibling) with Alzheimer disease have a greater chance of developing the disorder. The risk is probably highest if the family member developed Alzheimer disease at a younger age (less than 70 years old) and is lower if the family member did not get Alzheimer disease until late in life. However, families that have a very strong genetic  tendency toward Alzheimer disease are uncommon.  Other factors -- Studies indicate that high blood pressure, smoking, and diabetes may be risk factors for dementia. Experts are still not sure how treatment for these problems might influence your risk of developing dementia beyond their benefit of reducing stroke risk. Lifestyle factors have also been implicated in dementia. For instance, people who remain physically active, socially connected, and mentally engaged seem less likely to develop dementia than people who do not. These activities may produce more cognitive (mental) reserve or resilience, delaying the emergence of symptoms until an older age.  DEMENTIA SYMPTOMS Each form of dementia can cause difficulty with memory, language, reasoning, and judgment, but the symptoms are often very different from person to person. Symptoms also change over time. The differences between one form of dementia and another may only be recognizable to skilled health care providers who have experience working with people with dementia. Sometimes family members notice changes but mistakenly attribute them to aging.  Is memory loss normal? -- Many people worry that memory problems are related to early Alzheimer disease. However, some problems are normal and just related to aging, and do not signify a progressive dementia. Normal age-related changes often cause minor difficulties with immediate memory, for example, remembering a phone number or a set of directions for a short time. Temporary difficulty recalling proper names, even very familiar ones, is also common with aging. As people age normally, it is common to complain of less efficient and slower processing and learning of new information. Memory changes due to normal aging are usually mild and do not worsen greatly over   time, nor should they interfere with a person's day-to-day functioning.  Early changes -- The earliest symptoms of Alzheimer disease are gradual  and often subtle. Many people and their families first notice difficulty recalling recent events or information. This often emerges as a tendency to repeat stories or questions or to request or require repetition of material to be able to remember. If you find yourself telling an older family member or friend "I told you that earlier" or "You have told me that more than once," you might begin to suspect Alzheimer disease. Other changes can include one or more of the following: ?Difficulties with language (eg, not being able to find the right words for things) ?Difficulty with concentration and reasoning ?Problems with complex tasks like paying bills, cooking, or balancing a checkbook ?Getting lost in a familiar place  Late changes -- As Alzheimer disease progresses, a person's ability to think clearly continues to decline, and any or all of the changes listed above may be more disruptive. In addition, personality and behavioral symptoms can become quite troublesome. These can include: ?Increased anger or hostility, sometimes aggressive behavior; alternatively, some people become depressed or exhibit little interest in their surroundings (called "apathy") ?Sleep problems ?Hallucinations and/or delusions ?Disorientation ?Needing help with basic tasks (such as eating, bathing, and dressing) ?Incontinence (difficulty controlling the bladder and/or bowels)  The number of symptoms, the functions that are impaired, and the speed with which symptoms progress can vary widely from one person to the next. In some people, severe dementia occurs within five years of the diagnosis; for others, the progression can take more than 10 years. Most people with Alzheimer disease do not die from the disease itself, but rather from a secondary illness such as pneumonia, bladder infection, or complications of a fall.  DEMENTIA DIAGNOSIS To diagnose dementia and identify the type of dementia, health care providers typically  rely on the information they can gather by interacting with the person and speaking with their family members. The provider will typically perform memory and other cognitive (thinking) tests to assess the person's degree of difficulty with different types of problems. The results of these tests can then be monitored over time to observe whether functioning stays the same or declines.  Blood tests are usually done to find out if a chemical or hormonal imbalance or vitamin deficiency is contributing to the person's difficulties. Brain scans (usually MRI) are often performed in people with dementia to look for other problems. Sometimes the MRI can also help health care providers identify the type of dementia, since different types can have characteristic brain changes.  SAFETY AND LIFESTYLE ISSUES FOR PEOPLE WITH DEMENTIA A major issue for caregivers is making sure the person with dementia stays safe. Because many people with dementia do not realize that their mental functioning is impaired, they try to continue their day-to-day activities as usual. This can lead to physical danger, and caregivers must help to avoid situations that can threaten the safety of the person or others. The following information applies specifically to people with Alzheimer disease, but much of it is also relevant to people with other forms of dementia.  Medications -- People with Alzheimer disease often have trouble remembering to take medications they are prescribed for other conditions, or they become confused about which medications to take. They are also at increased risk for potentially dangerous side effects from certain medications. Sedatives and certain other drugs (such as some antihistamines and antidepressants) may carry risk of increasing cognitive impairment.   It is important to develop a plan for medication monitoring and safety. People with dementia often need help taking their medications. It's a good idea to throw  away old pill bottles and other medications that are no longer needed.  Driving -- Driving is often one of the first safety issues that arises in people with Alzheimer disease. In people with Alzheimer disease, the risk of having a car accident is significantly increased, especially as the disease progresses. It is best to discuss the issue of driving early, before the symptoms become advanced. Over time, everyone living long enough with dementia will reach a point where driving is too dangerous. Losing the ability to drive can be hard to accept because it represents independence for many people. It can also be challenging if the person does not completely appreciate their impairments in mental functioning or reaction time. In particular, driving at night may carry extra risk. Many people with mild but worrisome impairments will insist that they can safely drive locally or in the daytime. That may be true for the present time; however, people may forget that they have agreed to limitations. In addition, their ability to drive safely, even with restrictions, will deteriorate over time. A roadside driving test is often recommended if there is disagreement or uncertainty about a person's ability to drive. However, if a person with newly diagnosed, mild Alzheimer disease is deemed still able to drive, they will need to be reassessed every six months, with the understanding that driving will eventually no longer be possible. There may be important insurance implications of continued driving when medical records document advice to stop driving.  Cooking -- Cooking is another area that can lead to serious safety concerns and may require help or supervision. Symptoms such as distractibility, forgetfulness, and difficulty following directions can lead to burns, fires, or other injuries. The use of gas cooking appliance raises a particular concern. A family member may have to ask the utility company to disconnect gas  stoves if there is potential for accident or injury. Newer induction electric stoves do not change color when on, and may carry an inadvertent burn risk if a person forgets what they are doing.  Wandering -- As dementia progresses, some people with Alzheimer disease begin to wander. Because restlessness, distractibility, and memory problems are common, a person who wanders may easily become lost. Identification bracelets can help ensure that a lost wanderer gets home. The [employer] provides a "Wandering Support" program that provides ID tags and 24-hour assistance to patients who are preregistered for this program. There are many "locator" applications that allow the person with dementia to wear or carry a GPS device that a family member can track with their cell phone. Regular exercise may decrease the restlessness that can lead to wandering. Exercise is also just good practice to maintain strength, good sleep, and overall health. If wandering continues, wearable alarm systems are available that alert caretakers when the person leaves the home.  Falls -- Falls with secondary injuries are one of the most important causes of additional disability in people with all types of dementia, including Alzheimer disease. Commonly used medications can increase risk of falls and injuries. Hip fracture is a particular concern in older people, as it can lead to serious complications and sometimes even death. To reduce the risk of falls, potential tripping hazards such as loose electrical cords, slippery rugs, and clutter should be removed. Inadvertent hoarding may develop and pose a safety risk. If the person lives   alone, family members or elder services should perform safety inspections of the living space periodically. Medication lists should also be reviewed with your doctor to identify those that might increase the risk of falling. Regular exercise, especially early in the course of dementia, and use of  assistive devices like canes can also help with balance.  DEMENTIA TREATMENT Medications for Alzheimer disease -- Many people with Alzheimer disease will have the option of trying a medication. A trial of medication is usually begun for a period of a few to several weeks while the person is monitored for side effects and response. A health care provider should periodically review all medications to see if they are providing any benefit. It is important to have realistic expectations about the potential benefits of medication therapy in Alzheimer disease. None of these medications cure the disease, and the reality is that over time the person will continue to worsen. When medication does have an effect, the goal is not to stop progression of the disease, but to improve quality of life for the person and their family to the extent possible.  Treatment to slow or delay progression -- There is currently no cure for Alzheimer disease. However, experts are studying treatments in the hope of finding a way to slow the progression of the mental and functional decline, along with scientific efforts to prevent or delay onset.  Treatment of memory problems -- There are several medicines currently available for treating the memory problems associated with Alzheimer disease; they are also used in people with other forms of dementia.  ?Donepezil (brand name: Aricept) This medications allow more of a chemical called acetylcholine to be active in the brain, making up for drops in acetylcholine levels that happen in Alzheimer disease. It can cause side effects such as nausea, vomiting, and diarrhea in some people. It is recommended to take this medicaiton with meals.  Treatment of behavioral symptoms -- The behavioral symptoms of Alzheimer disease are often more troubling than the cognitive (mental) symptoms. Even in mild cases, agitation, anxiety, and irritability can occur, and generally worsen as Alzheimer disease  advances. This can be stressful for the person as well as for their family and caregivers. A combination of medications and behavioral therapy may be helpful. Non-medication therapies are preferred, as virtually all medications used for behavioral symptoms can increase confusion and many are associated with serious side effects and even an increased risk of death.  Depression -- Depression is common, especially in the early phases of dementia. It may be treated with behavioral therapy and/or with medications. The key is to recognize that depression may be playing a role in the person's symptoms. If depression is causing distress, it is worth treating. Potentially helpful medicines include a group of medicines known as selective serotonin reuptake inhibitors, or SSRIs, which are usually preferred over other choices in patients with dementia. Widely used SSRIs include fluoxetine (brand name: Prozac), sertraline (brand name: Zoloft), paroxetine (sample brand names: Brisdelle, Paxil), citalopram (brand name: Celexa), and escitalopram (brand names: Lexapro, Cipralex).   A variety of behavioral therapies are often helpful, do not have the side effects often seen with medications, and may be recommended for depression. Behavioral therapy involves changing the person's environment (eg, regular exercise, avoiding triggers that cause sadness, socializing with others, engaging in pleasant activities that a person enjoys).  Agitation and aggression -- One of the most difficult issues for caregivers and people with Alzheimer disease is aggressive behavior. Fortunately, this behavior is not common. However,  many family members are reluctant to report aggressive behavior. In some cases, the behavior becomes physically abusive as dementia progresses. Agitation and aggression can be caused by a number of factors, including: ?Confusion, misunderstanding, or disorientation (doctors use the term "delirium" as a general term to  describe a state of confusion in which a person does not think or behave normally) ?Frightening or paranoid delusions or hallucinations ?Depression or anxiety ?Sleep disorders, such as reduced sleep or altered sleep/wake cycles ?Certain medical conditions that can cause delirium, such as urinary tract infection or pneumonia ?Being in physical pain or discomfort ?Side effects of certain medications Delusions (ie, believing something that is not real or true) are common in patients with dementia, occurring in up to 30 percent of those with advanced disease. Paranoid delusions are particularly distressing to both the patients and the caregivers: these often include beliefs that someone has invaded the house, that family members have been replaced by impostors, that spouses have been unfaithful, or that personal possessions have been stolen.   Family members should discuss any concerns about aggressive behavior with a health care provider and arrange for help if necessary. The best treatment for these symptoms depends upon what triggers them. As an example, a person who becomes aggressive during periods of confusion might best be treated by talking through the problem, while someone who becomes aggressive during delusions might require medication. Often, behavior improves once an underlying medical condition is treated. Caregivers can learn strategies to help lessen the number of triggers and confrontations.   Sleep problems -- Sleep disorders can be treated with either medicine or behavior changes or both: for example, limiting daytime naps, increasing physical activity, avoiding caffeine and alcohol in the evening. In some people, medication to help with sleep may be recommended, although these medications almost always have side effects (eg, worsened confusion and increased risk of falls). Maintaining daily rhythms, using artificial lighting when needed during the day, and avoiding bright light exposures  during the night may help maintain normal wake-sleep cycles.   COPING WITH DEMENTIA Being diagnosed with any form of dementia can be distressing and overwhelming for the person affected as well as their loved ones. For people with dementia -- It is important for people with early dementia to care for their physical and mental health. This means getting regular checkups, taking medicines if needed, eating a healthy diet, exercising regularly, getting enough sleep, and avoiding activities that may be risky. It is often helpful to talk to others through support groups or a counselor or social worker to discuss any feelings of anxiety, frustration, anger, loneliness, or depression. All of these feelings are normal, and dealing with these feelings can help you to feel more in control of your life and health. It can also help to talk to other people who are going through a similar experience. Another issue to consider is how to tell your family and friends about your diagnosis. Explaining the disease can help others to understand what to expect and how they can help, now and in the future. This can be especially helpful for children and grandchildren, who may not be familiar with the condition. While many people are able to live alone in the early stages of dementia, you may need help with tasks such as housekeeping, cooking, transportation, and paying bills. If possible, ask a friend or family member for help making plans to deal with these and other issues as dementia progresses. Occupational therapists, and sometimes speech pathologists, can help  to set up your home to minimize confusion and keep you independent for as long as possible. It's also important to establish Power of Attorney and Health Care Proxy statuses early, before a financial or health care crisis happens. This involves completing paperwork to determine who can make decisions on your behalf if needed. In addition, you should discuss your  preferences regarding issues that are likely to become important as your dementia worsens, including: ?Is health insurance available, and what does it cover? ?Where will I live? ?Who will make health care and end-of-life decisions if I can't make them for myself? ?Who will pay for care? A number of resources are available to assist in this type of planning.   For caregivers -- Dementia can also impose an enormous burden on families and other caregivers. People with dementia become less able to care for themselves as the condition progresses. If you are caring for someone with dementia, the following may help: ?Make a daily plan and prepare to be flexible if needed. ?Try to be patient when responding to repetitive questions, behaviors, or statements. ?Try not to argue or confront the person with dementia when they express mistaken ideas or facts. Change the subject or gently remind the person of an inaccuracy. Arguing or trying to convince a person of "the truth" is a natural reaction but it can be frustrating to all and can trigger unwanted behavior and feelings. ?Use memory aids such as writing out a list of daily activities, phone numbers, and instructions for usual tasks (ie, the telephone, microwave, etc). It may help if these are posted and easily visible so that the person need not remember to look for the aids. ?Establish calm and consistent nighttime routines to manage behavioral problems, which are often worst at night. Leave a night light on in the person's bedroom. ?Avoid major changes to the home environment (for example, rearranging furniture). ?Employ safety measures in the home, such as putting locks on medicine cabinets, keeping furniture in the same place to prevent falls, reducing clutter, removing electrical appliances from the bathroom, installing grab bars in the bathroom, and setting the water heater below 120F. ?Help the person with personal care tasks as needed. It is not  necessary to bathe every day, although a health care provider should be notified if the person develops sores in the mouth or genitals related to hygiene problems (eg, ill-fitting dentures, urine leakage). ?Speak slowly, present only one idea at a time, and be patient when waiting for responses. ?Encourage physical activity and exercise. Even a daily walk can help prevent physical decline and improve behavioral problems. ?Consider respite care. Respite care can provide a needed break and give you a chance to recharge. This is offered in many communities in the form of in-home care or adult day care. Caregiving can be an all-consuming experience, and it's essential to take time for yourself, take care of your own medical problems, and arrange for breaks when you need them. ?See if your area has a support group for people caring for loved ones with dementia. It can help to talk with other people who understand what you are going through.

## 2022-10-17 ENCOUNTER — Encounter: Payer: PPO | Admitting: Psychology

## 2022-10-31 DIAGNOSIS — Z23 Encounter for immunization: Secondary | ICD-10-CM | POA: Diagnosis not present

## 2022-11-08 DIAGNOSIS — Z23 Encounter for immunization: Secondary | ICD-10-CM | POA: Diagnosis not present

## 2022-11-28 DIAGNOSIS — M13861 Other specified arthritis, right knee: Secondary | ICD-10-CM | POA: Diagnosis not present

## 2022-11-28 DIAGNOSIS — M1711 Unilateral primary osteoarthritis, right knee: Secondary | ICD-10-CM | POA: Diagnosis not present

## 2022-11-29 ENCOUNTER — Telehealth: Payer: Self-pay | Admitting: *Deleted

## 2022-11-29 ENCOUNTER — Telehealth: Payer: Self-pay

## 2022-11-29 NOTE — Telephone Encounter (Signed)
   Name: Brandon Valdez  DOB: 1934-08-07  MRN: [id number]  Primary Cardiologist: Kirk Ruths, MD  Chart reviewed as part of pre-operative protocol coverage. Because of Brandon Valdez's past medical history and time since last visit, he will require a follow-up in-office visit in order to better assess preoperative cardiovascular risk.  Pre-op covering staff: - Please schedule appointment and call patient to inform them. If patient already had an upcoming appointment within acceptable timeframe, please add "pre-op clearance" to the appointment notes so provider is aware. - Please contact requesting surgeon's office via preferred method (i.e, phone, fax) to inform them of need for appointment prior to surgery.  This patient already has an appointmnet 2/8 with Dr. Stanford Breed. I will add PREOP CLEARANCE to the Appt. Can hold aspirin x 5 days if asymptomatic.  Elgie Collard, PA-C  11/29/2022, 12:11 PM

## 2022-11-29 NOTE — Telephone Encounter (Signed)
   Pre-operative Risk Assessment    Patient Name: Brandon Valdez  DOB: Feb 20, 1934 MRN: [id number]      Request for Surgical Clearance    Procedure:   Right TKA  Date of Surgery:  Clearance TBD                                 Surgeon:  Dr. Esmond Plants Surgeon's Group or Practice Name:  [facility] Phone number:  [phone] Fax number:  [phone]   Type of Clearance Requested:   - Medical  - Pharmacy:  Hold Aspirin TBD   Type of Anesthesia:   Choice   Additional requests/questions:    Signed, Greer Ee   11/29/2022, 10:46 AM

## 2022-11-29 NOTE — Telephone Encounter (Signed)
Received PreOp Clearance from [facility]. Will place request papers on Dr Billey Gosling desk for signature.

## 2022-12-04 NOTE — Telephone Encounter (Signed)
Form completed, recommendation hold medications: donepezil 14 days.  Faxed back to [phone]. Confirmation received.

## 2022-12-12 DIAGNOSIS — M13861 Other specified arthritis, right knee: Secondary | ICD-10-CM | POA: Diagnosis not present

## 2022-12-18 NOTE — Progress Notes (Signed)
HPI: FU AAA. Nuclear study October 2015 showed ejection fraction 46% with left bundle branch block artifact and no ischemia. Echocardiogram January 2020 showed ejection fraction 40 to 26%, grade 1 diastolic dysfunction. Chest CT September 2020 showed aortic atherosclerosis and three-vessel coronary disease.  Echocardiogram March 2023 showed ejection fraction 50 to 55%, mild to moderate left atrial enlargement, mild right atrial enlargement.  Abdominal ultrasound April 2023 showed 4.6 cm abdominal aortic aneurysm.  Since last seen patient denies dyspnea, chest pain, palpitations or syncope.  He can walk at least 1 mile without having chest pain.  He is scheduled to have right knee replacement.  Current Outpatient Medications  Medication Sig Dispense Refill   amLODipine (NORVASC) 5 MG tablet Take 1 tablet by mouth daily.     aspirin 81 MG tablet Take 1 tablet (81 mg total) by mouth daily. May resume after 1 week on July 4 30 tablet    cetirizine (ZYRTEC) 10 MG tablet Take 10 mg by mouth daily as needed for allergies.     cyclobenzaprine (FLEXERIL) 10 MG tablet Take 1 tablet (10 mg total) by mouth 3 (three) times daily as needed for muscle spasms. 20 tablet 0   donepezil (ARICEPT) 10 MG tablet Take 1 tablet (10 mg total) by mouth daily. 30 tablet 5   escitalopram (LEXAPRO) 10 MG tablet Take 20 mg by mouth daily.     ezetimibe (ZETIA) 10 MG tablet TAKE 1 TABLET BY MOUTH DAILY 90 tablet 3   fluticasone (FLONASE) 50 MCG/ACT nasal spray Place 1 spray into both nostrils daily as needed for allergies.     HYDROcodone-acetaminophen (NORCO/VICODIN) 5-325 MG tablet Take 1 tablet by mouth every 6 (six) hours as needed for moderate pain. 14 tablet 0   ibuprofen (ADVIL,MOTRIN) 200 MG tablet Take 200 mg by mouth every 6 (six) hours as needed for moderate pain.     ipratropium (ATROVENT) 0.06 % nasal spray 2 sprays daily as needed (allergies).     levothyroxine (SYNTHROID) 125 MCG tablet Take 125 mcg by mouth  daily.     losartan (COZAAR) 50 MG tablet TAKE 1 TABLET BY MOUTH DAILY 90 tablet 3   meclizine (ANTIVERT) 25 MG tablet Take 25 mg by mouth 3 (three) times daily as needed for dizziness.     metoprolol succinate (TOPROL XL) 25 MG 24 hr tablet Take 1 tablet (25 mg total) by mouth daily. 90 tablet 3   Multiple Vitamin (MULTIVITAMIN) capsule Take 1 capsule by mouth daily.     psyllium (METAMUCIL) 58.6 % packet Take 1 packet by mouth daily.     QUEtiapine (SEROQUEL) 25 MG tablet Take 25 mg by mouth at bedtime.     rosuvastatin (CRESTOR) 20 MG tablet TAKE 1 TABLET BY MOUTH DAILY 30 tablet 10   Saw Palmetto 450 MG CAPS Take 1 capsule by mouth daily.     vitamin B-12 (CYANOCOBALAMIN) 1000 MCG tablet Take 1,000 mcg by mouth daily.     donepezil (ARICEPT) 5 MG tablet Take 1 tablet (5 mg total) by mouth daily. Take 1 pill daily for 4 weeks, then increase to 10 mg daily 30 tablet 0   No current facility-administered medications for this visit.     Past Medical History:  Diagnosis Date   AAA (abdominal aortic aneurysm) ([place])    Hyperlipidemia    Hypertension    Hypothyroid    Nephrolithiasis    PUD (peptic ulcer disease)     Past Surgical History:  Procedure  Laterality Date   CATARACT EXTRACTION, BILATERAL     INGUINAL HERNIA REPAIR Left    spine cyst     TONSILLECTOMY      Social History   Socioeconomic History   Marital status: Single    Spouse name: Not on file   Number of children: 3   Years of education: Not on file   Highest education level: Not on file  Occupational History   Not on file  Tobacco Use   Smoking status: Former    Packs/day: 1.00    Years: 45.00    Total pack years: 45.00    Types: Cigarettes    Start date: 48    Quit date: 1977    Years since quitting: 47.1   Smokeless tobacco: Former    Quit date: 11/21/1975  Vaping Use   Vaping Use: Never used  Substance and Sexual Activity   Alcohol use: Yes    Comment: Occasional   Drug use: No   Sexual  activity: Not on file  Other Topics Concern   Not on file  Social History Narrative   Not on file   Social Determinants of Health   Financial Resource Strain: Not on file  Food Insecurity: Not on file  Transportation Needs: Not on file  Physical Activity: Not on file  Stress: Not on file  Social Connections: Not on file  Intimate Partner Violence: Not on file    Family History  Problem Relation Age of Onset   AAA (abdominal aortic aneurysm) Father    AAA (abdominal aortic aneurysm) Brother    Alzheimer's disease Brother    Panic disorder Son     ROS: Knee arthralgias but no fevers or chills, productive cough, hemoptysis, dysphasia, odynophagia, melena, hematochezia, dysuria, hematuria, rash, seizure activity, orthopnea, PND, pedal edema, claudication. Remaining systems are negative.  Physical Exam: Well-developed well-nourished in no acute distress.  Skin is warm and dry.  HEENT is normal.  Neck is supple.  Chest is clear to auscultation with normal expansion.  Cardiovascular exam is regular rate and rhythm.  Abdominal exam nontender or distended. No masses palpated. Extremities show no edema. neuro grossly intact  ECG-normal sinus rhythm at a rate of 64, left bundle branch block.  Personally reviewed  A/P  1 abdominal aortic aneurysm-plan follow-up ultrasound in April 2024.  2 coronary calcification-no chest pain.  Continue aspirin and statin.  3 hypertension-blood pressure controlled.  Continue present medications.  4 hyperlipidemia-continue statin.  5 history of cardiomyopathy-continue ARB and beta-blocker.  Most recent echocardiogram showed low normal LV function.  6 Preoperative evaluation prior to knee replacement-patient has excellent functional capacity.  He is able to walk at least 1 mile and climb 2 flights of stairs with no chest pain or dyspnea.  He may proceed without additional cardiac testing.  Kirk Ruths, MD

## 2022-12-19 NOTE — Telephone Encounter (Signed)
Requesting office faxed over duplicate clearance request. Pt has appt 12/28/22 with Dr. Stanford Breed for pre op clearance. Once MD has cleared the pt he will have his nurse fax over his ov notes giving clearance and any recommendations for holding ASA.

## 2022-12-21 DIAGNOSIS — G309 Alzheimer's disease, unspecified: Secondary | ICD-10-CM | POA: Diagnosis not present

## 2022-12-21 DIAGNOSIS — Z01818 Encounter for other preprocedural examination: Secondary | ICD-10-CM | POA: Diagnosis not present

## 2022-12-21 DIAGNOSIS — G479 Sleep disorder, unspecified: Secondary | ICD-10-CM | POA: Diagnosis not present

## 2022-12-21 DIAGNOSIS — F0280 Dementia in other diseases classified elsewhere without behavioral disturbance: Secondary | ICD-10-CM | POA: Diagnosis not present

## 2022-12-28 ENCOUNTER — Encounter: Payer: Self-pay | Admitting: Cardiology

## 2022-12-28 ENCOUNTER — Ambulatory Visit: Payer: Medicare Other | Attending: Cardiology | Admitting: Cardiology

## 2022-12-28 VITALS — BP 128/68 | HR 63 | Ht 72.0 in | Wt 190.6 lb

## 2022-12-28 DIAGNOSIS — I429 Cardiomyopathy, unspecified: Secondary | ICD-10-CM | POA: Diagnosis not present

## 2022-12-28 DIAGNOSIS — I714 Abdominal aortic aneurysm, without rupture, unspecified: Secondary | ICD-10-CM

## 2022-12-28 DIAGNOSIS — I10 Essential (primary) hypertension: Secondary | ICD-10-CM

## 2022-12-28 DIAGNOSIS — E7800 Pure hypercholesterolemia, unspecified: Secondary | ICD-10-CM

## 2022-12-28 DIAGNOSIS — Z01810 Encounter for preprocedural cardiovascular examination: Secondary | ICD-10-CM

## 2022-12-28 NOTE — Patient Instructions (Addendum)
Medication Instructions:  Your physician recommends that you continue on your current medications as directed. Please refer to the Current Medication list given to you today.  *If you need a refill on your cardiac medications before your next appointment, please call your pharmacy*   Lab Work: NONE ordered at this time of appointment   If you have labs (blood work) drawn today and your tests are completely normal, you will receive your results only by: [place] (if you have MyChart) OR A paper copy in the mail If you have any lab test that is abnormal or we need to change your treatment, we will call you to review the results.  Testing/Procedures: Your physician has requested that you have an abdominal aorta duplex. During this test, an ultrasound is used to evaluate the aorta. Allow 30 minutes for this exam. Do not eat after midnight the day before and avoid carbonated beverages  Please schedule for April   Follow-Up: At [facility], you and your health needs are our priority.  As part of our continuing mission to provide you with exceptional heart care, we have created designated Provider Care Teams.  These Care Teams include your primary Cardiologist (physician) and Advanced Practice Providers (APPs -  Physician Assistants and Nurse Practitioners) who all work together to provide you with the care you need, when you need it.  We recommend signing up for the patient portal called "MyChart".  Sign up information is provided on this After Visit Summary.  MyChart is used to connect with patients for Virtual Visits (Telemedicine).  Patients are able to view lab/test results, encounter notes, upcoming appointments, etc.  Non-urgent messages can be sent to your provider as well.   To learn more about what you can do with MyChart, go to [email or web address].    Your next appointment:   6 month(s)  Provider:   Kirk Ruths, MD     Other Instructions

## 2022-12-31 ENCOUNTER — Other Ambulatory Visit: Payer: Self-pay | Admitting: Cardiology

## 2023-01-02 ENCOUNTER — Telehealth: Payer: Self-pay | Admitting: *Deleted

## 2023-01-02 NOTE — Telephone Encounter (Signed)
Faxed completed/signed form below/office notes back to [phone]. Received fax confirmation.

## 2023-01-03 ENCOUNTER — Telehealth: Payer: Self-pay

## 2023-01-03 DIAGNOSIS — F0280 Dementia in other diseases classified elsewhere without behavioral disturbance: Secondary | ICD-10-CM | POA: Diagnosis not present

## 2023-01-03 DIAGNOSIS — G479 Sleep disorder, unspecified: Secondary | ICD-10-CM | POA: Diagnosis not present

## 2023-01-03 DIAGNOSIS — F3341 Major depressive disorder, recurrent, in partial remission: Secondary | ICD-10-CM | POA: Diagnosis not present

## 2023-01-03 NOTE — Patient Instructions (Signed)
Visit Information  Thank you for taking time to visit with me today. Please don't hesitate to contact me if I can be of assistance to you.   Following are the goals we discussed today:   Goals Addressed             This Visit's Progress    COMPLETED: Care Coordination Activities-No follow up required       Interventions Today    Flowsheet Row Most Recent Value  General Interventions   General Interventions Discussed/Reviewed General Interventions Discussed, Doctor Visits  Doctor Visits Discussed/Reviewed Doctor Visits Discussed, Annual Wellness Visits               If you are experiencing a Mental Health or [place] or need someone to talk to, please call the Suicide and Crisis Lifeline: 988   The patient verbalized understanding of instructions, educational materials, and care plan provided today and DECLINED offer to receive copy of patient instructions, educational materials, and care plan.   The patient has been provided with contact information for the care management team and has been advised to call with any health related questions or concerns.  No further follow up required: decline  [name], RN, MSN [place] [facility] Care Management Coordinator Direct Line [phone]

## 2023-01-03 NOTE — Patient Outreach (Signed)
  Care Coordination   Initial Visit Note   01/03/2023 Name: Brandon Valdez MRN: [id number] DOB: 02-04-1934  Brandon Valdez is a 89 y.o. year old male who sees Brandon Valdez, Brandon Valdez, Brandon Valdez for primary care. I engaged with Brandon Valdez in the providers office today.  What matters to the patients health and wellness today?  NONE    Goals Addressed             This Visit's Progress    COMPLETED: Care Coordination Activities-No follow up required       Interventions Today    Flowsheet Row Most Recent Value  General Interventions   General Interventions Discussed/Reviewed General Interventions Discussed, Doctor Visits  Doctor Visits Discussed/Reviewed Doctor Visits Discussed, Annual Wellness Visits              SDOH assessments and interventions completed:  Yes  SDOH Interventions Today    Flowsheet Row Most Recent Value  SDOH Interventions   Food Insecurity Interventions Intervention Not Indicated  Housing Interventions Intervention Not Indicated        Care Coordination Interventions:  Yes, provided   Follow up plan: No further intervention required.   Encounter Outcome:  Pt. Visit Completed   Brandon Baseman, RN, MSN [place] [facility] Care Management Coordinator Direct Line [phone]

## 2023-01-30 DIAGNOSIS — R42 Dizziness and giddiness: Secondary | ICD-10-CM | POA: Diagnosis not present

## 2023-02-05 ENCOUNTER — Other Ambulatory Visit: Payer: Self-pay | Admitting: Cardiology

## 2023-02-05 DIAGNOSIS — E7800 Pure hypercholesterolemia, unspecified: Secondary | ICD-10-CM

## 2023-02-07 NOTE — H&P (Signed)
Patient's anticipated LOS is less than 2 midnights, meeting these requirements: - Younger than 87 - Lives within 1 hour of care - Has a competent adult at home to recover with post-op recover - NO history of  - Chronic pain requiring opiods  - Diabetes  - Coronary Artery Disease  - Heart failure  - Heart attack  - Stroke  - DVT/VTE  - Cardiac arrhythmia  - Respiratory Failure/COPD  - Renal failure  - Anemia  - Advanced Liver disease     Brandon Valdez is an 89 y.o. male.    Chief Complaint: right knee pain  HPI: Pt is a 89 y.o. male complaining of right knee pain for multiple years. Pain had continually increased since the beginning. X-rays in the clinic show end-stage arthritic changes of the right knee. Pt has tried various conservative treatments which have failed to alleviate their symptoms, including injections and therapy. Various options are discussed with the patient. Risks, benefits and expectations were discussed with the patient. Patient understand the risks, benefits and expectations and wishes to proceed with surgery.   PCP:  Holland Commons, FNP  D/C Plans: Home  PMH: Past Medical History:  Diagnosis Date   AAA (abdominal aortic aneurysm) ([place])    Hyperlipidemia    Hypertension    Hypothyroid    Nephrolithiasis    PUD (peptic ulcer disease)     PSH: Past Surgical History:  Procedure Laterality Date   CATARACT EXTRACTION, BILATERAL     INGUINAL HERNIA REPAIR Left    spine cyst     TONSILLECTOMY      Social History:  reports that he quit smoking about 47 years ago. His smoking use included cigarettes. He started smoking about 72 years ago. He has a 45.00 pack-year smoking history. He quit smokeless tobacco use about 47 years ago. He reports current alcohol use. He reports that he does not use drugs. BMI: Estimated body mass index is 25.85 kg/m as calculated from the following:   Height as of 12/28/22: 6' (1.829 m).   Weight as of 12/28/22: 86.5  kg.  Lab Results  Component Value Date   ALBUMIN 3.1 (L) 05/15/2022   Diabetes: Patient does not have a diagnosis of diabetes.     Smoking Status:      Allergies:  Allergies  Allergen Reactions   Losartan Potassium     Other reaction(s): cough   Monopril [Fosinopril] Other (See Comments)    Unspecified    Valium [Diazepam] Other (See Comments)    unspecified    Medications: No current facility-administered medications for this encounter.   Current Outpatient Medications  Medication Sig Dispense Refill   amLODipine (NORVASC) 5 MG tablet Take 1 tablet by mouth daily.     aspirin 81 MG tablet Take 1 tablet (81 mg total) by mouth daily. May resume after 1 week on July 4 30 tablet    cetirizine (ZYRTEC) 10 MG tablet Take 10 mg by mouth daily as needed for allergies.     cyclobenzaprine (FLEXERIL) 10 MG tablet Take 1 tablet (10 mg total) by mouth 3 (three) times daily as needed for muscle spasms. 20 tablet 0   donepezil (ARICEPT) 10 MG tablet Take 1 tablet (10 mg total) by mouth daily. 30 tablet 5   donepezil (ARICEPT) 5 MG tablet Take 1 tablet (5 mg total) by mouth daily. Take 1 pill daily for 4 weeks, then increase to 10 mg daily 30 tablet 0   escitalopram (LEXAPRO) 10  MG tablet Take 20 mg by mouth daily.     ezetimibe (ZETIA) 10 MG tablet TAKE 1 TABLET BY MOUTH DAILY 90 tablet 3   fluticasone (FLONASE) 50 MCG/ACT nasal spray Place 1 spray into both nostrils daily as needed for allergies.     HYDROcodone-acetaminophen (NORCO/VICODIN) 5-325 MG tablet Take 1 tablet by mouth every 6 (six) hours as needed for moderate pain. 14 tablet 0   ibuprofen (ADVIL,MOTRIN) 200 MG tablet Take 200 mg by mouth every 6 (six) hours as needed for moderate pain.     ipratropium (ATROVENT) 0.06 % nasal spray 2 sprays daily as needed (allergies).     levothyroxine (SYNTHROID) 125 MCG tablet Take 125 mcg by mouth daily.     losartan (COZAAR) 50 MG tablet TAKE 1 TABLET BY MOUTH DAILY 90 tablet 3    meclizine (ANTIVERT) 25 MG tablet Take 25 mg by mouth 3 (three) times daily as needed for dizziness.     metoprolol succinate (TOPROL XL) 25 MG 24 hr tablet Take 1 tablet (25 mg total) by mouth daily. 90 tablet 3   Multiple Vitamin (MULTIVITAMIN) capsule Take 1 capsule by mouth daily.     psyllium (METAMUCIL) 58.6 % packet Take 1 packet by mouth daily.     QUEtiapine (SEROQUEL) 25 MG tablet Take 25 mg by mouth at bedtime.     rosuvastatin (CRESTOR) 20 MG tablet TAKE 1 TABLET BY MOUTH DAILY 30 tablet 10   Saw Palmetto 450 MG CAPS Take 1 capsule by mouth daily.     vitamin B-12 (CYANOCOBALAMIN) 1000 MCG tablet Take 1,000 mcg by mouth daily.      No results found for this or any previous visit (from the past 48 hour(s)). No results found.  ROS: Pain with rom of the right lower extremity  Physical Exam: Alert and oriented 89 y.o. male in no acute distress Cranial nerves 2-12 intact Cervical spine: full rom with no tenderness, nv intact distally Chest: active breath sounds bilaterally, no wheeze rhonchi or rales Heart: regular rate and rhythm, no murmur Abd: non tender non distended with active bowel sounds Hip is stable with rom  Right knee moderate medial and lateral joint line tenderness Nv intact distally No rashes or edema Antalgic gait  Assessment/Plan Assessment: right knee end stage osteoarthritis  Plan:  Patient will undergo a right total knee by Dr. Veverly Fells at [place] Risks benefits and expectations were discussed with the patient. Patient understand risks, benefits and expectations and wishes to proceed. Preoperative templating of the joint replacement has been completed, documented, and submitted to the Operating Room personnel in order to optimize intra-operative equipment management.   Merla Riches PA-C, MPAS [facility] is now [employer] [address], [place], [place], [place] [address] Phone:  [phone] [email or web address] Facebook  [employer]

## 2023-02-12 ENCOUNTER — Other Ambulatory Visit: Payer: Self-pay | Admitting: Cardiology

## 2023-02-12 DIAGNOSIS — E7800 Pure hypercholesterolemia, unspecified: Secondary | ICD-10-CM

## 2023-02-15 NOTE — Progress Notes (Addendum)
Anesthesia Review:  PCP: Holland Commons, NP LOV 01/03/23  Cardiologist : DR Kirk Ruths  LOV 12/28/22   Neuro- Jennifer Chima LOV 10/10/22  Chest x-ray : EKG : 12/28/22  Echo : Stress test: Cardiac Cath :  Activity level: can do a flight of stairs without difficuty  Sleep Study/ CPAP : none  Fasting Blood Sugar :      / Checks Blood Sugar -- times a day:   Blood Thinner/ Instructions /Last Dose: ASA / Instructions/ Last Dose :    81 mg aspirin    PT with some memory loss issues following Covid in 2020 .   [place].    Daughter with pt at preop appt.

## 2023-02-15 NOTE — Patient Instructions (Addendum)
SURGICAL WAITING ROOM VISITATION  Patients having surgery or a procedure may have no more than 2 support people in the waiting area - these visitors may rotate.    Children under the age of 35 must have an adult with them who is not the patient.  Due to an increase in RSV and influenza rates and associated hospitalizations, children ages 34 and under may not visit patients in [place].  If the patient needs to stay at the hospital during part of their recovery, the visitor guidelines for inpatient rooms apply. Pre-op nurse will coordinate an appropriate time for 1 support person to accompany patient in pre-op.  This support person may not rotate.    Please refer to the [facility] website for the visitor guidelines for Inpatients (after your surgery is over and you are in a regular room).       Your procedure is scheduled on:  03/02/2023    Report to [facility] Main Entrance    Report to admitting at  [place] AM   Call this number if you have problems the morning of surgery [phone]   Do not eat food :After Midnight.   After Midnight you may have the following liquids until _ 0430_____ AM  DAY OF SURGERY  Water Non-Citrus Juices (without pulp, NO RED-Apple, White grape, White cranberry) Black Coffee (NO MILK/CREAM OR CREAMERS, sugar ok)  Clear Tea (NO MILK/CREAM OR CREAMERS, sugar ok) regular and decaf                             Plain Jell-O (NO RED)                                           Fruit ices (not with fruit pulp, NO RED)                                     Popsicles (NO RED)                                                               Sports drinks like Gatorade (NO RED)           The day of surgery:  Drink ONE (1) Pre-Surgery Clear Ensure or G2 at  0430AM  ( have completed by ) the morning of surgery. Drink in one sitting. Do not sip.  This drink was given to you during your hospital  pre-op appointment visit. Nothing else to drink after  completing the  Pre-Surgery Clear Ensure or G2.          If you have questions, please contact your surgeon's office.     Oral Hygiene is also important to reduce your risk of infection.                                    Remember - BRUSH YOUR TEETH THE MORNING OF SURGERY WITH YOUR REGULAR TOOTHPASTE  DENTURES WILL BE REMOVED PRIOR TO SURGERY  PLEASE DO NOT APPLY "Poly grip" OR ADHESIVES!!!   Do NOT smoke after Midnight   Take these medicines the morning of surgery with A SIP OF WATER:   amlodipne, zyrtec, aricept, lexapr, flonase, synthroid   DO NOT TAKE ANY ORAL DIABETIC MEDICATIONS DAY OF YOUR SURGERY  Bring CPAP mask and tubing day of surgery.                              You may not have any metal on your body including hair pins, jewelry, and body piercing             Do not wear make-up, lotions, powders, perfumes/cologne, or deodorant  Do not wear nail polish including gel and S&S, artificial/acrylic nails, or any other type of covering on natural nails including finger and toenails. If you have artificial nails, gel coating, etc. that needs to be removed by a nail salon please have this removed prior to surgery or surgery may need to be canceled/ delayed if the surgeon/ anesthesia feels like they are unable to be safely monitored.   Do not shave  48 hours prior to surgery.               Men may shave face and neck.   Do not bring valuables to the hospital. [place].   Contacts, glasses, dentures or bridgework may not be worn into surgery.   Bring small overnight bag day of surgery.   DO NOT [place]. PHARMACY WILL DISPENSE MEDICATIONS LISTED ON YOUR MEDICATION LIST TO YOU DURING YOUR ADMISSION [place]!    Patients discharged on the day of surgery will not be allowed to drive home.  Someone NEEDS to stay with you for the first 24 hours after anesthesia.   Special Instructions:  Bring a copy of your healthcare power of attorney and living will documents the day of surgery if you haven't scanned them before.              Please read over the following fact sheets you were given: IF [place] [phone]   If you received a COVID test during your pre-op visit  it is requested that you wear a mask when out in public, stay away from anyone that may not be feeling well and notify your surgeon if you develop symptoms. If you test positive for Covid or have been in contact with anyone that has tested positive in the last 10 days please notify you surgeon.

## 2023-02-20 ENCOUNTER — Encounter (HOSPITAL_COMMUNITY)
Admission: RE | Admit: 2023-02-20 | Discharge: 2023-02-20 | Disposition: A | Discharge status: Home / Self Care | Payer: Medicare Other | Source: Ambulatory Visit | Attending: Orthopedic Surgery | Admitting: Orthopedic Surgery

## 2023-02-20 ENCOUNTER — Encounter (HOSPITAL_COMMUNITY): Payer: Self-pay

## 2023-02-20 ENCOUNTER — Other Ambulatory Visit: Payer: Self-pay

## 2023-02-20 VITALS — BP 138/76 | HR 78 | Temp 98.5°F | Resp 16 | Ht 73.0 in | Wt 181.0 lb

## 2023-02-20 DIAGNOSIS — Z8711 Personal history of peptic ulcer disease: Secondary | ICD-10-CM | POA: Diagnosis not present

## 2023-02-20 DIAGNOSIS — Q231 Congenital insufficiency of aortic valve: Secondary | ICD-10-CM | POA: Diagnosis not present

## 2023-02-20 DIAGNOSIS — I714 Abdominal aortic aneurysm, without rupture, unspecified: Secondary | ICD-10-CM | POA: Insufficient documentation

## 2023-02-20 DIAGNOSIS — E785 Hyperlipidemia, unspecified: Secondary | ICD-10-CM | POA: Diagnosis not present

## 2023-02-20 DIAGNOSIS — M1711 Unilateral primary osteoarthritis, right knee: Secondary | ICD-10-CM | POA: Diagnosis not present

## 2023-02-20 DIAGNOSIS — Z87891 Personal history of nicotine dependence: Secondary | ICD-10-CM | POA: Diagnosis not present

## 2023-02-20 DIAGNOSIS — E039 Hypothyroidism, unspecified: Secondary | ICD-10-CM | POA: Diagnosis not present

## 2023-02-20 DIAGNOSIS — Z01812 Encounter for preprocedural laboratory examination: Secondary | ICD-10-CM | POA: Diagnosis not present

## 2023-02-20 DIAGNOSIS — I10 Essential (primary) hypertension: Secondary | ICD-10-CM | POA: Diagnosis not present

## 2023-02-20 DIAGNOSIS — Z01818 Encounter for other preprocedural examination: Secondary | ICD-10-CM

## 2023-02-20 HISTORY — DX: Unspecified osteoarthritis, unspecified site: M19.90

## 2023-02-20 HISTORY — DX: Personal history of urinary calculi: Z87.442

## 2023-02-20 LAB — BASIC METABOLIC PANEL
Anion gap: 8 (ref 5–15)
BUN: 15 mg/dL (ref 8–23)
CO2: 26 mmol/L (ref 22–32)
Calcium: 9 mg/dL (ref 8.9–10.3)
Chloride: 104 mmol/L (ref 98–111)
Creatinine, Ser: 0.85 mg/dL (ref 0.61–1.24)
GFR, Estimated: >60 mL/min (ref >60)
Glucose, Bld: 96 mg/dL (ref 70–99)
Potassium: 4 mmol/L (ref 3.5–5.1)
Sodium: 138 mmol/L (ref 135–145)

## 2023-02-20 LAB — CBC
HCT: 49 % (ref 39.0–52.0)
Hemoglobin: 16.1 g/dL (ref 13.0–17.0)
MCH: 30.8 pg (ref 26.0–34.0)
MCHC: 32.9 g/dL (ref 30.0–36.0)
MCV: 93.9 fL (ref 80.0–100.0)
Platelets: 227 10*3/uL (ref 150–400)
RBC: 5.22 MIL/uL (ref 4.22–5.81)
RDW: 13.3 % (ref 11.5–15.5)
WBC: 9 10*3/uL (ref 4.0–10.5)
nRBC: 0 % (ref 0.0–0.2)

## 2023-02-20 LAB — SURGICAL PCR SCREEN
MRSA, PCR: NEGATIVE
Staphylococcus aureus: NEGATIVE

## 2023-02-23 ENCOUNTER — Encounter (HOSPITAL_COMMUNITY): Payer: Self-pay | Admitting: Physician Assistant

## 2023-02-23 NOTE — Anesthesia Preprocedure Evaluation (Signed)
Anesthesia Evaluation    Airway        Dental   Pulmonary former smoker          Cardiovascular hypertension,      Neuro/Psych    GI/Hepatic   Endo/Other    Renal/GU      Musculoskeletal   Abdominal   Peds  Hematology   Anesthesia Other Findings   Reproductive/Obstetrics                             Anesthesia Physical Anesthesia Plan  ASA:   Anesthesia Plan:    Post-op Pain Management:    Induction:   PONV Risk Score and Plan:   Airway Management Planned:   Additional Equipment:   Intra-op Plan:   Post-operative Plan:   Informed Consent:   Plan Discussed with:   Anesthesia Plan Comments: (See PAT note 02/20/23)       Anesthesia Quick Evaluation

## 2023-02-23 NOTE — Progress Notes (Signed)
Anesthesia Chart Review   Case: [id number] Date/Time: 03/02/23 0715   Procedure: TOTAL KNEE ARTHROPLASTY (Right: Knee) - general, spinal   Anesthesia type: General   Pre-op diagnosis: right knee osteoarthritis   Location: [facility] ROOM 06 / [facility]   Surgeons: Beverely LowNorris, Steve, MD       DISCUSSION:88 y.o. former smoker with h/o HTN, AAA, right knee OA scheduled for above procedure 03/02/23 with Dr. Beverely LowSteve Norris.   Pt last seen by cardiology 12/28/2022. Per OV note, "Preoperative evaluation prior to knee replacement-patient has excellent functional capacity. He is able to walk at least 1 mile and climb 2 flights of stairs with no chest pain or dyspnea. He may proceed without additional cardiac testing."  Anticipate pt can proceed with planned procedure barring acute status change.   VS: BP 138/76   Pulse 78   Temp 36.9 C (Oral)   Resp 16   Ht 6\' 1"  (1.854 m)   Wt 82.1 kg   SpO2 96%   BMI 23.88 kg/m   PROVIDERS: Fatima SangerPrevost, Mary Ellen, FNP is PCP   Olga Millersrenshaw, Brian, MD is Cardiologist  LABS: Labs reviewed: Acceptable for surgery. (all labs ordered are listed, but only abnormal results are displayed)  Labs Reviewed  SURGICAL PCR SCREEN  CBC  BASIC METABOLIC PANEL     IMAGES: VAS US AAA Duplex 02/21/2022 Summary:  Abdominal Aorta: There is evidence of abnormal dilatation of the mid  Abdominal aorta. There is evidence of abnormal dilation of the Right  Common Iliac artery and Left Common Iliac artery. The largest aortic  measurement is 4.6 cm. The largest aortic  diameter remains essentially unchanged compared to prior exam. Previous  diameter measurement was 4.6 cm.   EKG:   CV: Echo 02/02/2022  1. Left ventricular ejection fraction, by estimation, is 50 to 55%. The  left ventricle has low normal function. The left ventricle demonstrates  global hypokinesis. Left ventricular diastolic parameters are  indeterminate.   2. Right ventricular systolic function is normal. The right  ventricular  size is normal. There is normal pulmonary artery systolic pressure.   3. Left atrial size was mild to moderately dilated.   4. Right atrial size was mildly dilated.   5. The mitral valve is grossly normal. Trivial mitral valve  regurgitation. No evidence of mitral stenosis. Moderate mitral annular  calcification.   6. The aortic valve is bicuspid. There is moderate calcification of the  aortic valve. There is moderate thickening of the aortic valve. Aortic  valve regurgitation is not visualized. Aortic valve  sclerosis/calcification is present, without any evidence  of aortic stenosis.   7. The inferior vena cava is dilated in size with >50% respiratory  variability, suggesting right atrial pressure of 8 mmHg.  Past Medical History:  Diagnosis Date   AAA (abdominal aortic aneurysm)    Arthritis    History of kidney stones    Hyperlipidemia    Hypertension    Hypothyroid    Nephrolithiasis    PUD (peptic ulcer disease)     Past Surgical History:  Procedure Laterality Date   CATARACT EXTRACTION, BILATERAL     INGUINAL HERNIA REPAIR Left    spine cyst     TONSILLECTOMY      MEDICATIONS:  amLODipine (NORVASC) 5 MG tablet   aspirin 81 MG tablet   Calcium Carbonate (CALCIUM 600 PO)   cetirizine (ZYRTEC) 10 MG tablet   cyclobenzaprine (FLEXERIL) 10 MG tablet   donepezil (ARICEPT) 10 MG tablet  escitalopram (LEXAPRO) 10 MG tablet   ezetimibe (ZETIA) 10 MG tablet   hydrochlorothiazide (HYDRODIURIL) 12.5 MG tablet   ibuprofen (ADVIL,MOTRIN) 200 MG tablet   levothyroxine (SYNTHROID) 125 MCG tablet   losartan (COZAAR) 50 MG tablet   meclizine (ANTIVERT) 25 MG tablet   Multiple Vitamin (MULTIVITAMIN) capsule   psyllium (METAMUCIL) 58.6 % packet   QUEtiapine (SEROQUEL) 25 MG tablet   rosuvastatin (CRESTOR) 20 MG tablet   Saw Palmetto 450 MG CAPS   No current facility-administered medications for this encounter.     Jodell Cipro Ward, PA-C [facility]  Testing [phone]

## 2023-02-26 ENCOUNTER — Ambulatory Visit (HOSPITAL_COMMUNITY)
Admission: RE | Admit: 2023-02-26 | Discharge: 2023-02-26 | Disposition: A | Discharge status: Home / Self Care | Payer: Medicare Other | Source: Ambulatory Visit | Attending: Internal Medicine | Admitting: Internal Medicine

## 2023-02-26 DIAGNOSIS — I714 Abdominal aortic aneurysm, without rupture, unspecified: Secondary | ICD-10-CM

## 2023-02-27 ENCOUNTER — Telehealth: Payer: Self-pay

## 2023-02-27 ENCOUNTER — Telehealth: Payer: Self-pay | Admitting: *Deleted

## 2023-02-27 NOTE — Telephone Encounter (Signed)
Caller: Debra with [facility], Dr. Ludwig Clarks office  Concern: per [place] on 4/8, AAA increased from 4.6 to 5.3 cm Pt having knee arthroplasty on 4/11 Called son, asked him to call [facility] to inform surgeon. Dr. Ranell Patrick called requesting advice on postponing the ortho surgery.  Consulted: T. Lenell Antu, MD  Resolution: Appointment scheduled with Dr. Lenell Antu, ortho surgery postponed  Next Appt: Appointment scheduled for 03/06/23 @ 0900 per Dr. Lenell Antu

## 2023-02-27 NOTE — Telephone Encounter (Signed)
Spoke with pt son, aware due to increase in size of AAA, have asked [facility] to see the patient sooner than June. Left a message for the triage nurse at [facility] about moving the patients follow up appointment.

## 2023-02-27 NOTE — Telephone Encounter (Signed)
-----   Message from Lewayne Bunting, MD sent at 02/26/2023 10:17 AM EDT ----- Regarding: FW: AAA INCREASED Please schedule pt to see vascular surgery sooner than FU visit in June Olga Millers  ----- Message ----- From: Elliot Gault, RVT Sent: 02/26/2023  10:09 AM EDT To: Lewayne Bunting, MD Subject: AAA INCREASED                                  Hi,  Last AAA measured 4.3 x 4.6 cm 02/21/2022  Today it measured 5.3 x 5.0 cm mid/distal, with IMA involvement. Saccular shaped.   He denies abdominal and back pains and strong family history.   He is scheduled to have a knee replacement 03/02/2023.  He is scheduled to go to [facility] for follow up appointment 05/08/2023.   I sent the patient home.   Thanks, Gladstone Lighter

## 2023-03-02 ENCOUNTER — Ambulatory Visit (HOSPITAL_COMMUNITY): Admission: RE | Admit: 2023-03-02 | Payer: Medicare Other | Source: Ambulatory Visit | Admitting: Orthopedic Surgery

## 2023-03-02 ENCOUNTER — Encounter (HOSPITAL_COMMUNITY): Admission: RE | Payer: Self-pay | Source: Ambulatory Visit

## 2023-03-02 SURGERY — ARTHROPLASTY, KNEE, TOTAL
Anesthesia: General | Site: Knee | Laterality: Right

## 2023-03-06 ENCOUNTER — Encounter: Payer: Self-pay | Admitting: Vascular Surgery

## 2023-03-06 ENCOUNTER — Ambulatory Visit: Payer: Medicare Other | Admitting: Vascular Surgery

## 2023-03-06 VITALS — BP 132/92 | HR 83 | Temp 98.0°F | Resp 20 | Ht 73.0 in | Wt 182.8 lb

## 2023-03-06 DIAGNOSIS — I7143 Infrarenal abdominal aortic aneurysm, without rupture: Secondary | ICD-10-CM

## 2023-03-06 NOTE — H&P (View-Only) (Signed)
[facility] [place]  ASSESSMENT / PLAN: Brandon Valdez is a 88 y.o. male with a infrarenal abdominal aortic aneurysm measuring 53mm. He needs the aneurysm repaired prior to proceeding with orthopedic surgery.   Recommend: Complete cessation from all tobacco products. Blood glucose control with goal A1c < 7%. Blood pressure control with goal blood pressure < 140/90 mmHg. Lipid reduction therapy with goal LDL-C <100 mg/dL. Aspirin 81mg PO QD.  Atorvastatin 40-80mg PO QD (or other "high intensity" statin therapy).  Check CT angiogram of abdomen / pelvis. Will call the patient with the results  CHIEF COMPLAINT: growth of AAA  HISTORY OF PRESENT ILLNESS: Brandon Valdez is a 88 y.o. male turns to clinic to discuss enlarging abdominal aortic aneurysm.  The patient has been followed by our group for many years and has seen slow growth of the aneurysm.  Over the past 12 months he is grown from 4.6 cm to 5.3 cm.  The patient is planned to undergo right total knee arthroplasty, but this has been delayed for definitive management of the aneurysm.  We reviewed the 2 main ways of repairing aneurysms.  We reviewed the rationale for CT angiogram to help plan the repair.   Past Medical History:  Diagnosis Date   AAA (abdominal aortic aneurysm)    Arthritis    History of kidney stones    Hyperlipidemia    Hypertension    Hypothyroid    Nephrolithiasis    PUD (peptic ulcer disease)     Past Surgical History:  Procedure Laterality Date   CATARACT EXTRACTION, BILATERAL     INGUINAL HERNIA REPAIR Left    spine cyst     TONSILLECTOMY      Family History  Problem Relation Age of Onset   AAA (abdominal aortic aneurysm) Father    AAA (abdominal aortic aneurysm) Brother    Alzheimer's disease Brother    Panic disorder Son     Social History   Socioeconomic History   Marital status: Single    Spouse name: Not on file   Number of children: 3   Years of  education: Not on file   Highest education level: Not on file  Occupational History   Not on file  Tobacco Use   Smoking status: Former    Packs/day: 1.00    Years: 45.00    Additional pack years: 0.00    Total pack years: 45.00    Types: Cigarettes    Start date: 1952    Quit date: 1977    Years since quitting: 47.3   Smokeless tobacco: Former    Quit date: 11/21/1975  Vaping Use   Vaping Use: Never used  Substance and Sexual Activity   Alcohol use: Yes    Comment: glss if wine in the pm   Drug use: No   Sexual activity: Not on file  Other Topics Concern   Not on file  Social History Narrative   Not on file   Social Determinants of Health   Financial Resource Strain: Not on file  Food Insecurity: No Food Insecurity (01/03/2023)   Hunger Vital Sign    Worried About Running Out of Food in the Last Year: Never true    Ran Out of Food in the Last Year: Never true  Transportation Needs: Not on file  Physical Activity: Not on file  Stress: Not on file  Social Connections: Not on file  Intimate Partner Violence: Not on file    Allergies    Allergen Reactions   Losartan Potassium Cough   Monopril [Fosinopril] Other (See Comments)    Dizziness     Valium [Diazepam] Other (See Comments)    Shaking, flu like symptoms     Current Outpatient Medications  Medication Sig Dispense Refill   amLODipine (NORVASC) 5 MG tablet Take 5 mg by mouth daily.     aspirin 81 MG tablet Take 1 tablet (81 mg total) by mouth daily. May resume after 1 week on July 4 (Patient taking differently: Take 81 mg by mouth daily.) 30 tablet    Calcium Carbonate (CALCIUM 600 PO) Take by mouth.     cetirizine (ZYRTEC) 10 MG tablet Take 10 mg by mouth daily.     donepezil (ARICEPT) 10 MG tablet Take 1 tablet (10 mg total) by mouth daily. 30 tablet 5   escitalopram (LEXAPRO) 10 MG tablet Take 20 mg by mouth daily.     ezetimibe (ZETIA) 10 MG tablet TAKE 1 TABLET BY MOUTH DAILY 90 tablet 3    hydrochlorothiazide (HYDRODIURIL) 12.5 MG tablet Take 12.5 mg by mouth daily.     ibuprofen (ADVIL,MOTRIN) 200 MG tablet Take 200 mg by mouth every 6 (six) hours as needed for moderate pain (inflammation).     levothyroxine (SYNTHROID) 125 MCG tablet Take 125 mcg by mouth daily.     losartan (COZAAR) 50 MG tablet TAKE 1 TABLET BY MOUTH DAILY 90 tablet 3   meclizine (ANTIVERT) 25 MG tablet Take 25 mg by mouth 3 (three) times daily as needed for dizziness.     Multiple Vitamin (MULTIVITAMIN) capsule Take 1 capsule by mouth daily.     psyllium (METAMUCIL) 58.6 % packet Take 1 packet by mouth daily.     QUEtiapine (SEROQUEL) 25 MG tablet Take 25 mg by mouth at bedtime.     rosuvastatin (CRESTOR) 20 MG tablet TAKE 1 TABLET BY MOUTH DAILY 30 tablet 10   Saw Palmetto 450 MG CAPS Take 450 mg by mouth daily.     cyclobenzaprine (FLEXERIL) 10 MG tablet Take 1 tablet (10 mg total) by mouth 3 (three) times daily as needed for muscle spasms. (Patient not taking: Reported on 02/15/2023) 20 tablet 0   No current facility-administered medications for this visit.    PHYSICAL EXAM Vitals:   03/06/23 0914  BP: (!) 132/92  Pulse: 83  Resp: 20  Temp: 98 F (36.7 C)  SpO2: 94%  Weight: 182 lb 12.8 oz (82.9 kg)  Height: 6' 1" (1.854 m)   Well-appearing elderly man in no distress Regular rate and rhythm Unlabored breathing Easily palpable pedal pulses   PERTINENT LABORATORY AND RADIOLOGIC DATA  Most recent CBC    Latest Ref Rng & Units 02/20/2023    2:36 PM 05/16/2022    5:12 AM 05/15/2022    7:37 AM  CBC  WBC 4.0 - 10.5 K/uL 9.0  8.3  13.0   Hemoglobin 13.0 - 17.0 g/dL 16.1  13.4  14.5   Hematocrit 39.0 - 52.0 % 49.0  41.6  44.1   Platelets 150 - 400 K/uL 227  155  178      Most recent CMP    Latest Ref Rng & Units 02/20/2023    2:36 PM 05/16/2022    5:12 AM 05/15/2022    7:37 AM  CMP  Glucose 70 - 99 mg/dL 96  109  107   BUN 8 - 23 mg/dL 15  7  11   Creatinine 0.61 - 1.24 mg/dL 0.85  0.67     0.78   Sodium 135 - 145 mmol/L 138  138  137   Potassium 3.5 - 5.1 mmol/L 4.0  3.7  3.6   Chloride 98 - 111 mmol/L 104  106  105   CO2 22 - 32 mmol/L 26  27  26   Calcium 8.9 - 10.3 mg/dL 9.0  8.2  8.3   Total Protein 6.5 - 8.1 g/dL   5.7   Total Bilirubin 0.3 - 1.2 mg/dL   1.0   Alkaline Phos 38 - 126 U/L   43   AST 15 - 41 U/L   15   ALT 0 - 44 U/L   17     Renal function Estimated Creatinine Clearance: 70.5 mL/min (by C-G formula based on SCr of 0.85 mg/dL).  No results found for: "HGBA1C"  LDL Calculated  Date Value Ref Range Status  01/28/2019 75 0 - 99 mg/dL Final     Vascular Imaging: Abdominal Aorta: There is evidence of abnormal dilatation of the  mid-distal abdominal aorta. There is evidence of abnormal dilation of the  right and left common iliac artery, and right and left external Iliac  artery. The largest aortic measurement is 5.3   cm. The largest aortic diameter has increased compared to prior exam.  Previous diameter measurement was 4.6 cm obtained on 02/21/2022.  Stenosis:  No evidence of stenosis seen.   Faduma Cho N. Meleah Demeyer, MD FACS [facility] [place] Office Phone Number: [phone] 03/06/2023 4:55 PM   Total time spent on preparing this encounter including chart review, data review, collecting history, examining the patient, coordinating care for this established patient, 40 minutes.  Portions of this report may have been transcribed using voice recognition software.  Every effort has been made to ensure accuracy; however, inadvertent computerized transcription errors may still be present.    

## 2023-03-06 NOTE — Progress Notes (Signed)
[facility] [place]  ASSESSMENT / PLAN: Brandon Valdez is a 89 y.o. male with a infrarenal abdominal aortic aneurysm measuring 53mm. He needs the aneurysm repaired prior to proceeding with orthopedic surgery.   Recommend: Complete cessation from all tobacco products. Blood glucose control with goal A1c < 7%. Blood pressure control with goal blood pressure < 140/90 mmHg. Lipid reduction therapy with goal LDL-C <100 mg/dL. Aspirin  PO QD.  Atorvastatin 40-80mg  PO QD (or other "high intensity" statin therapy).  Check CT angiogram of abdomen / pelvis. Will call the patient with the results  CHIEF COMPLAINT: growth of AAA  HISTORY OF PRESENT ILLNESS: Brandon Valdez is a 89 y.o. male turns to clinic to discuss enlarging abdominal aortic aneurysm.  The patient has been followed by our group for many years and has seen slow growth of the aneurysm.  Over the past 12 months he is grown from 4.6 cm to 5.3 cm.  The patient is planned to undergo right total knee arthroplasty, but this has been delayed for definitive management of the aneurysm.  We reviewed the 2 main ways of repairing aneurysms.  We reviewed the rationale for CT angiogram to help plan the repair.   Past Medical History:  Diagnosis Date   AAA (abdominal aortic aneurysm)    Arthritis    History of kidney stones    Hyperlipidemia    Hypertension    Hypothyroid    Nephrolithiasis    PUD (peptic ulcer disease)     Past Surgical History:  Procedure Laterality Date   CATARACT EXTRACTION, BILATERAL     INGUINAL HERNIA REPAIR Left    spine cyst     TONSILLECTOMY      Family History  Problem Relation Age of Onset   AAA (abdominal aortic aneurysm) Father    AAA (abdominal aortic aneurysm) Brother    Alzheimer's disease Brother    Panic disorder Son     Social History   Socioeconomic History   Marital status: Single    Spouse name: Not on file   Number of children: 3   Years of  education: Not on file   Highest education level: Not on file  Occupational History   Not on file  Tobacco Use   Smoking status: Former    Packs/day: 1.00    Years: 45.00    Additional pack years: 0.00    Total pack years: 45.00    Types: Cigarettes    Start date: 89    Quit date: 1977    Years since quitting: 47.3   Smokeless tobacco: Former    Quit date: 11/21/1975  Vaping Use   Vaping Use: Never used  Substance and Sexual Activity   Alcohol use: Yes    Comment: glss if wine in the pm   Drug use: No   Sexual activity: Not on file  Other Topics Concern   Not on file  Social History Narrative   Not on file   Social Determinants of Health   Financial Resource Strain: Not on file  Food Insecurity: No Food Insecurity (01/03/2023)   Hunger Vital Sign    Worried About Running Out of Food in the Last Year: Never true    Ran Out of Food in the Last Year: Never true  Transportation Needs: Not on file  Physical Activity: Not on file  Stress: Not on file  Social Connections: Not on file  Intimate Partner Violence: Not on file    Allergies  Allergen Reactions   Losartan Potassium Cough   Monopril [Fosinopril] Other (See Comments)    Dizziness     Valium [Diazepam] Other (See Comments)    Shaking, flu like symptoms     Current Outpatient Medications  Medication Sig Dispense Refill   amLODipine (NORVASC) 5 MG tablet Take 5 mg by mouth daily.     aspirin 81 MG tablet Take 1 tablet (81 mg total) by mouth daily. May resume after 1 week on July 4 (Patient taking differently: Take 81 mg by mouth daily.) 30 tablet    Calcium Carbonate (CALCIUM 600 PO) Take by mouth.     cetirizine (ZYRTEC) 10 MG tablet Take 10 mg by mouth daily.     donepezil (ARICEPT) 10 MG tablet Take 1 tablet (10 mg total) by mouth daily. 30 tablet 5   escitalopram (LEXAPRO) 10 MG tablet Take 20 mg by mouth daily.     ezetimibe (ZETIA) 10 MG tablet TAKE 1 TABLET BY MOUTH DAILY 90 tablet 3    hydrochlorothiazide (HYDRODIURIL) 12.5 MG tablet Take 12.5 mg by mouth daily.     ibuprofen (ADVIL,MOTRIN) 200 MG tablet Take 200 mg by mouth every 6 (six) hours as needed for moderate pain (inflammation).     levothyroxine (SYNTHROID) 125 MCG tablet Take 125 mcg by mouth daily.     losartan (COZAAR) 50 MG tablet TAKE 1 TABLET BY MOUTH DAILY 90 tablet 3   meclizine (ANTIVERT) 25 MG tablet Take 25 mg by mouth 3 (three) times daily as needed for dizziness.     Multiple Vitamin (MULTIVITAMIN) capsule Take 1 capsule by mouth daily.     psyllium (METAMUCIL) 58.6 % packet Take 1 packet by mouth daily.     QUEtiapine (SEROQUEL) 25 MG tablet Take 25 mg by mouth at bedtime.     rosuvastatin (CRESTOR) 20 MG tablet TAKE 1 TABLET BY MOUTH DAILY 30 tablet 10   Saw Palmetto 450 MG CAPS Take 450 mg by mouth daily.     cyclobenzaprine (FLEXERIL) 10 MG tablet Take 1 tablet (10 mg total) by mouth 3 (three) times daily as needed for muscle spasms. (Patient not taking: Reported on 02/15/2023) 20 tablet 0   No current facility-administered medications for this visit.    PHYSICAL EXAM Vitals:   03/06/23 0914  BP: (!) 132/92  Pulse: 83  Resp: 20  Temp: 98 F (36.7 C)  SpO2: 94%  Weight: 182 lb 12.8 oz (82.9 kg)  Height:  (1.854 m)   Well-appearing elderly man in no distress Regular rate and rhythm Unlabored breathing Easily palpable pedal pulses   PERTINENT LABORATORY AND RADIOLOGIC DATA  Most recent CBC    Latest Ref Rng & Units 02/20/2023    2:36 PM 05/16/2022    5:12 AM 05/15/2022    7:37 AM  CBC  WBC 4.0 - 10.5 K/uL 9.0  8.3  13.0   Hemoglobin 13.0 - 17.0 g/dL 16.1  09.6  04.5   Hematocrit 39.0 - 52.0 % 49.0  41.6  44.1   Platelets 150 - 400 K/uL 227  155  178      Most recent CMP    Latest Ref Rng & Units 02/20/2023    2:36 PM 05/16/2022    5:12 AM 05/15/2022    7:37 AM  CMP  Glucose 70 - 99 mg/dL 96  409  811   BUN 8 - 23 mg/dL Creatinine 0.61 - 1.24 mg/dL 9.14  7.82  0.78   Sodium 135 - 145 mmol/L 138  138  137   Potassium 3.5 - 5.1 mmol/L 4.0  3.7  3.6   Chloride 98 - 111 mmol/L 104  106  105   CO2 22 - 32 mmol/L 26  27  26    Calcium 8.9 - 10.3 mg/dL 9.0  8.2  8.3   Total Protein 6.5 - 8.1 g/dL   5.7   Total Bilirubin 0.3 - 1.2 mg/dL   1.0   Alkaline Phos 38 - 126 U/L   43   AST 15 - 41 U/L   15   ALT 0 - 44 U/L   17     Renal function Estimated Creatinine Clearance: 70.5 mL/min (by C-G formula based on SCr of 0.85 mg/dL).  No results found for: "HGBA1C"  LDL Calculated  Date Value Ref Range Status  01/28/2019 75 0 - 99 mg/dL Final     Vascular Imaging: Abdominal Aorta: There is evidence of abnormal dilatation of the  mid-distal abdominal aorta. There is evidence of abnormal dilation of the  right and left common iliac artery, and right and left external Iliac  artery. The largest aortic measurement is 5.3   cm. The largest aortic diameter has increased compared to prior exam.  Previous diameter measurement was 4.6 cm obtained on 02/21/2022.  Stenosis:  No evidence of stenosis seen.   Rande Brunt. Lenell Antu, MD [facility] Phone Number: [phone] 03/06/2023 4:55 PM   Total time spent on preparing this encounter including chart review, data review, collecting history, examining the patient, coordinating care for this established patient, 40 minutes.  Portions of this report may have been transcribed using voice recognition software.  Every effort has been made to ensure accuracy; however, inadvertent computerized transcription errors may still be present.

## 2023-03-07 ENCOUNTER — Other Ambulatory Visit: Payer: Self-pay

## 2023-03-07 DIAGNOSIS — I714 Abdominal aortic aneurysm, without rupture, unspecified: Secondary | ICD-10-CM

## 2023-03-09 ENCOUNTER — Ambulatory Visit (HOSPITAL_COMMUNITY): Payer: Medicare Other

## 2023-03-11 ENCOUNTER — Ambulatory Visit (HOSPITAL_BASED_OUTPATIENT_CLINIC_OR_DEPARTMENT_OTHER)
Admission: RE | Admit: 2023-03-11 | Discharge: 2023-03-11 | Disposition: A | Discharge status: Home / Self Care | Payer: Medicare Other | Source: Ambulatory Visit | Attending: Surgery | Admitting: Surgery

## 2023-03-11 DIAGNOSIS — I714 Abdominal aortic aneurysm, without rupture, unspecified: Secondary | ICD-10-CM | POA: Insufficient documentation

## 2023-03-11 DIAGNOSIS — I7143 Infrarenal abdominal aortic aneurysm, without rupture: Secondary | ICD-10-CM | POA: Diagnosis not present

## 2023-03-11 MED ORDER — IOHEXOL 350 MG/ML SOLN
100.0000 mL | Freq: Once | INTRAVENOUS | Status: AC | PRN
  Administered 2023-03-11: 100 mL via INTRAVENOUS

## 2023-03-12 ENCOUNTER — Ambulatory Visit (INDEPENDENT_AMBULATORY_CARE_PROVIDER_SITE_OTHER): Payer: Self-pay | Admitting: Vascular Surgery

## 2023-03-12 DIAGNOSIS — F0280 Dementia in other diseases classified elsewhere without behavioral disturbance: Secondary | ICD-10-CM | POA: Diagnosis not present

## 2023-03-12 DIAGNOSIS — F3341 Major depressive disorder, recurrent, in partial remission: Secondary | ICD-10-CM | POA: Diagnosis not present

## 2023-03-12 DIAGNOSIS — I7143 Infrarenal abdominal aortic aneurysm, without rupture: Secondary | ICD-10-CM

## 2023-03-12 DIAGNOSIS — G479 Sleep disorder, unspecified: Secondary | ICD-10-CM | POA: Diagnosis not present

## 2023-03-12 NOTE — Progress Notes (Signed)
Reviewed CT angiogram in detail. Anatomy appears amenable to endovascular repair.  Reviewed with the family. Will plan to proceed with EVAR as schedule allows.  Rande Brunt. Lenell Antu, MD [facility] Phone Number: [phone] 03/12/2023 11:16 AM

## 2023-03-14 ENCOUNTER — Other Ambulatory Visit: Payer: Self-pay

## 2023-03-14 DIAGNOSIS — I7143 Infrarenal abdominal aortic aneurysm, without rupture: Secondary | ICD-10-CM

## 2023-03-21 NOTE — Pre-Procedure Instructions (Signed)
Surgical Instructions    Your procedure is scheduled on Mar 29, 2023.  Report to [facility] Main Entrance "A" at 7:50 A.M., then check in with the Admitting office.  Call this number if you have problems the morning of surgery:  [phone]  If you have any questions prior to your surgery date call [phone]: Open Monday-Friday 8am-4pm If you experience any cold or flu symptoms such as cough, fever, chills, shortness of breath, etc. between now and your scheduled surgery, please notify us at the above number.     Remember:  Do not eat or drink after midnight the night before your surgery     Take these medicines the morning of surgery with A SIP OF WATER:  amLODipine (NORVASC)   aspirin   cetirizine (ZYRTEC)   donepezil (ARICEPT)   escitalopram (LEXAPRO)   ezetimibe (ZETIA)   levothyroxine (SYNTHROID)   rosuvastatin (CRESTOR)   meclizine (ANTIVERT) - may take if needed   As of today, STOP taking any Aspirin (unless otherwise instructed by your surgeon) Aleve, Naproxen, Ibuprofen, Motrin, Advil, Goody's, BC's, all herbal medications, fish oil, and all vitamins.                     Do NOT Smoke (Tobacco/Vaping) for 24 hours prior to your procedure.  If you use a CPAP at night, you may bring your mask/headgear for your overnight stay.   Contacts, glasses, piercing's, hearing aid's, dentures or partials may not be worn into surgery, please bring cases for these belongings.    For patients admitted to the hospital, discharge time will be determined by your treatment team.   Patients discharged the day of surgery will not be allowed to drive home, and someone needs to stay with them for 24 hours.  SURGICAL WAITING ROOM VISITATION Patients having surgery or a procedure may have no more than 2 support people in the waiting area - these visitors may rotate.   Children under the age of 72 must have an adult with them who is not the patient. If the patient needs to stay at the  hospital during part of their recovery, the visitor guidelines for inpatient rooms apply. Pre-op nurse will coordinate an appropriate time for 1 support person to accompany patient in pre-op.  This support person may not rotate.   Please refer to the [facility] website for the visitor guidelines for Inpatients (after your surgery is over and you are in a regular room).    Special instructions:   [place]- Preparing For Surgery  Before surgery, you can play an important role. Because skin is not sterile, your skin needs to be as free of germs as possible. You can reduce the number of germs on your skin by washing with CHG (chlorahexidine gluconate) Soap before surgery.  CHG is an antiseptic cleaner which kills germs and bonds with the skin to continue killing germs even after washing.    Oral Hygiene is also important to reduce your risk of infection.  Remember - BRUSH YOUR TEETH THE MORNING OF SURGERY WITH YOUR REGULAR TOOTHPASTE  Please do not use if you have an allergy to CHG or antibacterial soaps. If your skin becomes reddened/irritated stop using the CHG.  Do not shave (including legs and underarms) for at least 48 hours prior to first CHG shower. It is OK to shave your face.  Please follow these instructions carefully.   Shower the NIGHT BEFORE SURGERY and the MORNING OF SURGERY  If you  chose to wash your hair, wash your hair first as usual with your normal shampoo.  After you shampoo, rinse your hair and body thoroughly to remove the shampoo.  Use CHG Soap as you would any other liquid soap. You can apply CHG directly to the skin and wash gently with a scrungie or a clean washcloth.   Apply the CHG Soap to your body ONLY FROM THE NECK DOWN.  Do not use on open wounds or open sores. Avoid contact with your eyes, ears, mouth and genitals (private parts). Wash Face and genitals (private parts)  with your normal soap.   Wash thoroughly, paying special attention to the area where  your surgery will be performed.  Thoroughly rinse your body with warm water from the neck down.  DO NOT shower/wash with your normal soap after using and rinsing off the CHG Soap.  Pat yourself dry with a CLEAN TOWEL.  Wear CLEAN PAJAMAS to bed the night before surgery  Place CLEAN SHEETS on your bed the night before your surgery  DO NOT SLEEP WITH PETS.   Day of Surgery: Take a shower with CHG soap. Do not wear jewelry or makeup Do not wear lotions, powders, perfumes/colognes, or deodorant. Do not shave 48 hours prior to surgery.  Men may shave face and neck. Do not bring valuables to the hospital.  [facility] is not responsible for any belongings or valuables. Do not wear nail polish, gel polish, artificial nails, or any other type of covering on natural nails (fingers and toes) If you have artificial nails or gel coating that need to be removed by a nail salon, please have this removed prior to surgery. Artificial nails or gel coating may interfere with anesthesia's ability to adequately monitor your vital signs.  Wear Clean/Comfortable clothing the morning of surgery Remember to brush your teeth WITH YOUR REGULAR TOOTHPASTE.   Please read over the following fact sheets that you were given.    If you received a COVID test during your pre-op visit  it is requested that you wear a mask when out in public, stay away from anyone that may not be feeling well and notify your surgeon if you develop symptoms. If you have been in contact with anyone that has tested positive in the last 10 days please notify you surgeon.

## 2023-03-22 ENCOUNTER — Other Ambulatory Visit: Payer: Self-pay

## 2023-03-22 ENCOUNTER — Encounter (HOSPITAL_COMMUNITY): Payer: Self-pay

## 2023-03-22 ENCOUNTER — Encounter (HOSPITAL_COMMUNITY)
Admission: RE | Admit: 2023-03-22 | Discharge: 2023-03-22 | Disposition: A | Discharge status: Home / Self Care | Payer: Medicare Other | Source: Ambulatory Visit | Attending: Vascular Surgery | Admitting: Vascular Surgery

## 2023-03-22 VITALS — BP 113/61 | HR 70 | Temp 98.0°F | Resp 20 | Ht 73.0 in | Wt 188.6 lb

## 2023-03-22 DIAGNOSIS — E039 Hypothyroidism, unspecified: Secondary | ICD-10-CM | POA: Diagnosis not present

## 2023-03-22 DIAGNOSIS — I2510 Atherosclerotic heart disease of native coronary artery without angina pectoris: Secondary | ICD-10-CM | POA: Diagnosis not present

## 2023-03-22 DIAGNOSIS — K279 Peptic ulcer, site unspecified, unspecified as acute or chronic, without hemorrhage or perforation: Secondary | ICD-10-CM | POA: Diagnosis not present

## 2023-03-22 DIAGNOSIS — Z01812 Encounter for preprocedural laboratory examination: Secondary | ICD-10-CM | POA: Diagnosis not present

## 2023-03-22 DIAGNOSIS — M1990 Unspecified osteoarthritis, unspecified site: Secondary | ICD-10-CM | POA: Insufficient documentation

## 2023-03-22 DIAGNOSIS — I10 Essential (primary) hypertension: Secondary | ICD-10-CM | POA: Insufficient documentation

## 2023-03-22 DIAGNOSIS — I447 Left bundle-branch block, unspecified: Secondary | ICD-10-CM | POA: Insufficient documentation

## 2023-03-22 DIAGNOSIS — Z79899 Other long term (current) drug therapy: Secondary | ICD-10-CM | POA: Insufficient documentation

## 2023-03-22 DIAGNOSIS — I7143 Infrarenal abdominal aortic aneurysm, without rupture: Secondary | ICD-10-CM | POA: Diagnosis not present

## 2023-03-22 DIAGNOSIS — K5730 Diverticulosis of large intestine without perforation or abscess without bleeding: Secondary | ICD-10-CM | POA: Diagnosis not present

## 2023-03-22 DIAGNOSIS — E785 Hyperlipidemia, unspecified: Secondary | ICD-10-CM | POA: Insufficient documentation

## 2023-03-22 DIAGNOSIS — N210 Calculus in bladder: Secondary | ICD-10-CM | POA: Diagnosis not present

## 2023-03-22 DIAGNOSIS — Z01818 Encounter for other preprocedural examination: Secondary | ICD-10-CM

## 2023-03-22 LAB — TYPE AND SCREEN
ABO/RH(D): O POS
Antibody Screen: NEGATIVE

## 2023-03-22 LAB — URINALYSIS, ROUTINE W REFLEX MICROSCOPIC
Bacteria, UA: NONE SEEN
Bilirubin Urine: NEGATIVE
Glucose, UA: NEGATIVE mg/dL
Ketones, ur: NEGATIVE mg/dL
Leukocytes,Ua: NEGATIVE
Nitrite: NEGATIVE
Protein, ur: NEGATIVE mg/dL
Specific Gravity, Urine: 1.019 (ref 1.005–1.030)
pH: 5 (ref 5.0–8.0)

## 2023-03-22 LAB — COMPREHENSIVE METABOLIC PANEL
ALT: 18 U/L (ref 0–44)
AST: 24 U/L (ref 15–41)
Albumin: 3.6 g/dL (ref 3.5–5.0)
Alkaline Phosphatase: 54 U/L (ref 38–126)
Anion gap: 5 (ref 5–15)
BUN: 12 mg/dL (ref 8–23)
CO2: 31 mmol/L (ref 22–32)
Calcium: 9 mg/dL (ref 8.9–10.3)
Chloride: 103 mmol/L (ref 98–111)
Creatinine, Ser: 0.99 mg/dL (ref 0.61–1.24)
GFR, Estimated: >60 mL/min (ref >60)
Glucose, Bld: 107 mg/dL — ABNORMAL HIGH (ref 70–99)
Potassium: 3.9 mmol/L (ref 3.5–5.1)
Sodium: 139 mmol/L (ref 135–145)
Total Bilirubin: 0.8 mg/dL (ref 0.3–1.2)
Total Protein: 6.4 g/dL — ABNORMAL LOW (ref 6.5–8.1)

## 2023-03-22 LAB — CBC
HCT: 47.4 % (ref 39.0–52.0)
Hemoglobin: 15.4 g/dL (ref 13.0–17.0)
MCH: 30.6 pg (ref 26.0–34.0)
MCHC: 32.5 g/dL (ref 30.0–36.0)
MCV: 94 fL (ref 80.0–100.0)
Platelets: 209 10*3/uL (ref 150–400)
RBC: 5.04 MIL/uL (ref 4.22–5.81)
RDW: 13.2 % (ref 11.5–15.5)
WBC: 7.1 10*3/uL (ref 4.0–10.5)
nRBC: 0 % (ref 0.0–0.2)

## 2023-03-22 LAB — SURGICAL PCR SCREEN
MRSA, PCR: NEGATIVE
Staphylococcus aureus: NEGATIVE

## 2023-03-22 LAB — PROTIME-INR
INR: 1.1 (ref 0.8–1.2)
Prothrombin Time: 13.9 seconds (ref 11.4–15.2)

## 2023-03-22 LAB — APTT: aPTT: 32 seconds (ref 24–36)

## 2023-03-22 NOTE — Pre-Procedure Instructions (Addendum)
Surgical Instructions    Your procedure is scheduled on Mar 29, 2023.  Report to [facility] Main Entrance "A" at 7:50 A.M., then check in with the Admitting office.  Call this number if you have problems the morning of surgery:  [phone]  If you have any questions prior to your surgery date call [phone]: Open Monday-Friday 8am-4pm If you experience any cold or flu symptoms such as cough, fever, chills, shortness of breath, etc. between now and your scheduled surgery, please notify us at the above number.     Remember:  Do not eat or drink after midnight the night before your surgery     Take these medicines the morning of surgery with A SIP OF WATER:  amLODipine (NORVASC)   aspirin   cetirizine (ZYRTEC)   donepezil (ARICEPT)   escitalopram (LEXAPRO)   ezetimibe (ZETIA)   levothyroxine (SYNTHROID)   rosuvastatin (CRESTOR)   meclizine (ANTIVERT) - may take if needed   As of today, STOP taking any  saw palmetto, Aleve, Naproxen, Ibuprofen, Motrin, Advil, Goody's, BC's, all herbal medications, fish oil, and all vitamins.                     Do NOT Smoke (Tobacco/Vaping) for 24 hours prior to your procedure.  If you use a CPAP at night, you may bring your mask/headgear for your overnight stay.   Contacts, glasses, piercing's, hearing aid's, dentures or partials may not be worn into surgery, please bring cases for these belongings.    For patients admitted to the hospital, discharge time will be determined by your treatment team.   Patients discharged the day of surgery will not be allowed to drive home, and someone needs to stay with them for 24 hours.  SURGICAL WAITING ROOM VISITATION Patients having surgery or a procedure may have no more than 2 support people in the waiting area - these visitors may rotate.   Children under the age of 41 must have an adult with them who is not the patient. If the patient needs to stay at the hospital during part of their recovery,  the visitor guidelines for inpatient rooms apply. Pre-op nurse will coordinate an appropriate time for 1 support person to accompany patient in pre-op.  This support person may not rotate.   Please refer to the [facility] website for the visitor guidelines for Inpatients (after your surgery is over and you are in a regular room).    Special instructions:   [place]- Preparing For Surgery  Before surgery, you can play an important role. Because skin is not sterile, your skin needs to be as free of germs as possible. You can reduce the number of germs on your skin by washing with CHG (chlorahexidine gluconate) Soap before surgery.  CHG is an antiseptic cleaner which kills germs and bonds with the skin to continue killing germs even after washing.    Oral Hygiene is also important to reduce your risk of infection.  Remember - BRUSH YOUR TEETH THE MORNING OF SURGERY WITH YOUR REGULAR TOOTHPASTE  Please do not use if you have an allergy to CHG or antibacterial soaps. If your skin becomes reddened/irritated stop using the CHG.  Do not shave (including legs and underarms) for at least 48 hours prior to first CHG shower. It is OK to shave your face.  Please follow these instructions carefully.   Shower the NIGHT BEFORE SURGERY and the MORNING OF SURGERY  If you chose to wash your  hair, wash your hair first as usual with your normal shampoo.  After you shampoo, rinse your hair and body thoroughly to remove the shampoo.  Use CHG Soap as you would any other liquid soap. You can apply CHG directly to the skin and wash gently with a scrungie or a clean washcloth.   Apply the CHG Soap to your body ONLY FROM THE NECK DOWN.  Do not use on open wounds or open sores. Avoid contact with your eyes, ears, mouth and genitals (private parts). Wash Face and genitals (private parts)  with your normal soap.   Wash thoroughly, paying special attention to the area where your surgery will be  performed.  Thoroughly rinse your body with warm water from the neck down.  DO NOT shower/wash with your normal soap after using and rinsing off the CHG Soap.  Pat yourself dry with a CLEAN TOWEL.  Wear CLEAN PAJAMAS to bed the night before surgery  Place CLEAN SHEETS on your bed the night before your surgery  DO NOT SLEEP WITH PETS.   Day of Surgery: Take a shower with CHG soap. Do not wear jewelry or makeup Do not wear lotions, powders, perfumes/colognes, or deodorant. Do not shave 48 hours prior to surgery.  Men may shave face and neck. Do not bring valuables to the hospital.  [facility] is not responsible for any belongings or valuables. Do not wear nail polish, gel polish, artificial nails, or any other type of covering on natural nails (fingers and toes) If you have artificial nails or gel coating that need to be removed by a nail salon, please have this removed prior to surgery. Artificial nails or gel coating may interfere with anesthesia's ability to adequately monitor your vital signs.  Wear Clean/Comfortable clothing the morning of surgery Remember to brush your teeth WITH YOUR REGULAR TOOTHPASTE.   Please read over the following fact sheets that you were given.    If you received a COVID test during your pre-op visit  it is requested that you wear a mask when out in public, stay away from anyone that may not be feeling well and notify your surgeon if you develop symptoms. If you have been in contact with anyone that has tested positive in the last 10 days please notify you surgeon.

## 2023-03-22 NOTE — Progress Notes (Signed)
PCP - Fatima Sanger, FNP  Cardiologist -  Dr. Olga Millers  PPM/ICD - denies   Chest x-ray - n/a EKG - 12/28/22 Stress Test - 09/11/14 ECHO - 02/02/22 Cardiac Cath - denies  Sleep Study - denies   Fasting Blood Sugar - N/A   Last dose of GLP1 agonist-  N/A   Blood Thinner Instructions: N/A Aspirin Instructions: continue taking ASA, take DOS  ERAS Protcol - NPO order  COVID TEST- N/A   Anesthesia review: cardiac hx- pt has cardiac clearance for knee surgery. Knee surgery was postponed d/t needing vascular surgery. Pt denies cardiac symptoms.   Patient denies shortness of breath, fever, cough and chest pain at PAT appointment   All instructions explained to the patient, with a verbal understanding of the material. Patient agrees to go over the instructions while at home for a better understanding. The opportunity to ask questions was provided.

## 2023-03-23 NOTE — Anesthesia Preprocedure Evaluation (Signed)
Anesthesia Evaluation  Patient identified by MRN, date of birth, ID band Patient awake    Reviewed: Allergy & Precautions, NPO status , Patient's Chart, lab work & pertinent test results  Airway Mallampati: II  TM Distance: >3 FB Neck ROM: Full    Dental  (+) Teeth Intact, Dental Advisory Given   Pulmonary former smoker   breath sounds clear to auscultation       Cardiovascular hypertension, + Peripheral Vascular Disease  + dysrhythmias  Rhythm:Regular Rate:Normal     Neuro/Psych  PSYCHIATRIC DISORDERS  Depression     Neuromuscular disease    GI/Hepatic Neg liver ROS, PUD,,,  Endo/Other  Hypothyroidism    Renal/GU Renal disease     Musculoskeletal  (+) Arthritis ,    Abdominal   Peds  Hematology negative hematology ROS (+)   Anesthesia Other Findings   Reproductive/Obstetrics                             Anesthesia Physical Anesthesia Plan  ASA: 3  Anesthesia Plan: General   Post-op Pain Management: Tylenol PO (pre-op)*   Induction: Intravenous  PONV Risk Score and Plan: 3 and Ondansetron, Dexamethasone and Treatment may vary due to age or medical condition  Airway Management Planned: Oral ETT  Additional Equipment: Arterial line  Intra-op Plan:   Post-operative Plan: Extubation in OR  Informed Consent: I have reviewed the patients History and Physical, chart, labs and discussed the procedure including the risks, benefits and alternatives for the proposed anesthesia with the patient or authorized representative who has indicated his/her understanding and acceptance.     Dental advisory given  Plan Discussed with: CRNA  Anesthesia Plan Comments: (PAT note written 03/23/2023 by Shonna Chock, PA-C.  )       Anesthesia Quick Evaluation

## 2023-03-23 NOTE — Progress Notes (Signed)
Anesthesia Chart Review:  Case: [id number] Date/Time: 03/29/23 0939   Procedure: ABDOMINAL AORTIC ENDOVASCULAR STENT GRAFT   Anesthesia type: General   Pre-op diagnosis: Infrarenal abdominal aortic aneurysm without rupture   Location: [facility] OR ROOM 16 / [facility] OR   Surgeons: Leonie Douglas, MD       DISCUSSION: Patient is an 89 year old male scheduled for the above procedure. He was scheduled to undergo right TKA by Dr. Ranell Patrick on 03/02/23, but surveillance of AAA showed increase in size to > 5 cm and referred to vascular surgery first. 03/11/23 CTA showed infrareanal AAA with increased eccentric clot and increased size to 5.3 x 4.6 cm from 5.0 x 4.3 cm since 05/14/22.  History includes former smoker (quit 11/21/75), HTN, HLD, left BBB, PUD, hypothyroidism, AAA, osteoarthritis.   Last cardiology note by Dr. Jens Som was from 12/28/22 for follow-up AAA, coronary calcifications on imaging, HTN, HLD, LBBB with history of cardiomyopathy (LVEF ~ 45%, low risk stress test in 08/2014) with LVEF improved to 50-55% 3/023. Continue ARB and b-blocker. Excellent activity tolerance. He was planning for right TKA at that time and wrote, "Preoperative evaluation prior to knee replacement-patient has excellent functional capacity. He is able to walk at least 1 mile and climb 2 flights of stairs with no chest pain or dyspnea. He may proceed without additional cardiac testing."   Anesthesia team to evaluate on the day of surgery.   VS: BP 113/61   Pulse 70   Temp 36.7 C (Oral)   Resp 20   Ht 6\' 1"  (1.854 m)   Wt 85.5 kg   SpO2 96%   BMI 24.88 kg/m    PROVIDERS: Fatima Sanger, FNP is PCP  Olga Millers, MD is cardiologist   LABS: Labs reviewed: Acceptable for surgery. (all labs ordered are listed, but only abnormal results are displayed)  Labs Reviewed  COMPREHENSIVE METABOLIC PANEL - Abnormal; Notable for the following components:      Result Value   Glucose, Bld 107 (*)    Total Protein 6.4 (*)     All other components within normal limits  URINALYSIS, ROUTINE W REFLEX MICROSCOPIC - Abnormal; Notable for the following components:   Hgb urine dipstick SMALL (*)    All other components within normal limits  SURGICAL PCR SCREEN  CBC  PROTIME-INR  APTT  TYPE AND SCREEN     IMAGES: CTA Abd/pelvis 03/11/23: IMPRESSION: 1. Infrarenal abdominal aortic aneurysm with eccentric clot has increased since 05/14/2022 and now measures 5.3 x 4.6 cm. Recommend follow-up CT/MR every 6 months and vascular consultation. This recommendation follows ACR consensus guidelines: White Paper of the ACR Incidental Findings Committee II on Vascular Findings. J Am Coll Radiol 2013; 10:789-794. 2. Increased size of a urinary bladder stone. 3. Colonic diverticulosis without evidence of diverticulitis.   EKG: 12/28/2022 ([facility]): Normal sinus rhythm Nonspecific intraventricular block (left bundle branch block) Cannot rule out anterior septal infarct, age undetermined T wave abnormality, consider lateral ischemia   CV: Echo 02/02/2022:  1. Left ventricular ejection fraction, by estimation, is 50 to 55%. The  left ventricle has low normal function. The left ventricle demonstrates  global hypokinesis. Left ventricular diastolic parameters are  indeterminate.   2. Right ventricular systolic function is normal. The right ventricular  size is normal. There is normal pulmonary artery systolic pressure.   3. Left atrial size was mild to moderately dilated.   4. Right atrial size was mildly dilated.   5. The mitral valve is  grossly normal. Trivial mitral valve  regurgitation. No evidence of mitral stenosis. Moderate mitral annular  calcification.   6. The aortic valve is bicuspid. There is moderate calcification of the  aortic valve. There is moderate thickening of the aortic valve. Aortic  valve regurgitation is not visualized. Aortic valve  sclerosis/calcification is present, without any evidence   of aortic stenosis.   7. The inferior vena cava is dilated in size with >50% respiratory  variability, suggesting right atrial pressure of 8 mmHg.  - Comparison echo 12/13/2018:  LVEF 40-45%, diffuse LV hypokinesis, grade 1 DD, PA peak pressure 38 mmHg. 09/11/2014 showed LBBB, LVEF 46%, no reversible defect, low risk study.  Nuclear stress test 09/11/2014: Overall Impression:  Low risk stress nuclear study BBB artifact. LV Wall Motion:  Mild LV dysfunction with discoordinate septal motion c/w BBB      Past Medical History:  Diagnosis Date   AAA (abdominal aortic aneurysm) (HCC)    Arthritis    History of kidney stones    Hyperlipidemia    Hypertension    Hypothyroid    Nephrolithiasis    PUD (peptic ulcer disease)     Past Surgical History:  Procedure Laterality Date   CATARACT EXTRACTION, BILATERAL     INGUINAL HERNIA REPAIR Left    spine cyst     TONSILLECTOMY      MEDICATIONS:  amLODipine (NORVASC) 5 MG tablet   aspirin 81 MG tablet   Calcium Carbonate (CALCIUM 600 PO)   cetirizine (ZYRTEC) 10 MG tablet   cyclobenzaprine (FLEXERIL) 10 MG tablet   donepezil (ARICEPT) 10 MG tablet   escitalopram (LEXAPRO) 20 MG tablet   ezetimibe (ZETIA) 10 MG tablet   hydrochlorothiazide (HYDRODIURIL) 12.5 MG tablet   ibuprofen (ADVIL,MOTRIN) 200 MG tablet   levothyroxine (SYNTHROID) 125 MCG tablet   losartan (COZAAR) 50 MG tablet   meclizine (ANTIVERT) 25 MG tablet   Multiple Vitamin (MULTIVITAMIN) capsule   psyllium (METAMUCIL) 58.6 % packet   QUEtiapine (SEROQUEL) 25 MG tablet   rosuvastatin (CRESTOR) 20 MG tablet   Saw Palmetto 450 MG CAPS   No current facility-administered medications for this encounter.    Shonna Chock, PA-C Surgical Short Stay/Anesthesiology [facility] Phone [phone] [facility] Phone [phone] 03/23/2023 2:08 PM

## 2023-03-27 DIAGNOSIS — M25561 Pain in right knee: Secondary | ICD-10-CM | POA: Diagnosis not present

## 2023-03-28 NOTE — Progress Notes (Signed)
patient's son voiced understanding of new arrival time of 46 tomorrow

## 2023-03-29 ENCOUNTER — Other Ambulatory Visit: Payer: Self-pay

## 2023-03-29 ENCOUNTER — Inpatient Hospital Stay (HOSPITAL_COMMUNITY): Payer: Medicare Other | Admitting: Vascular Surgery

## 2023-03-29 ENCOUNTER — Inpatient Hospital Stay (HOSPITAL_COMMUNITY)
Admission: RE | Admit: 2023-03-29 | Discharge: 2023-03-30 | DRG: 269 | Disposition: A | Discharge status: Home / Self Care | Payer: Medicare Other | Attending: Vascular Surgery | Admitting: Vascular Surgery

## 2023-03-29 ENCOUNTER — Encounter (HOSPITAL_COMMUNITY): Payer: Self-pay | Admitting: Vascular Surgery

## 2023-03-29 ENCOUNTER — Inpatient Hospital Stay (HOSPITAL_COMMUNITY): Payer: Medicare Other | Admitting: Anesthesiology

## 2023-03-29 ENCOUNTER — Inpatient Hospital Stay (HOSPITAL_COMMUNITY): Payer: Medicare Other

## 2023-03-29 ENCOUNTER — Encounter (HOSPITAL_COMMUNITY)
Admission: RE | Disposition: A | Discharge status: Home / Self Care | Payer: Self-pay | Source: Home / Self Care | Attending: Vascular Surgery

## 2023-03-29 DIAGNOSIS — Z79899 Other long term (current) drug therapy: Secondary | ICD-10-CM | POA: Diagnosis not present

## 2023-03-29 DIAGNOSIS — Z9841 Cataract extraction status, right eye: Secondary | ICD-10-CM | POA: Diagnosis not present

## 2023-03-29 DIAGNOSIS — E039 Hypothyroidism, unspecified: Secondary | ICD-10-CM | POA: Diagnosis not present

## 2023-03-29 DIAGNOSIS — H903 Sensorineural hearing loss, bilateral: Secondary | ICD-10-CM | POA: Diagnosis not present

## 2023-03-29 DIAGNOSIS — Z8711 Personal history of peptic ulcer disease: Secondary | ICD-10-CM

## 2023-03-29 DIAGNOSIS — Z888 Allergy status to other drugs, medicaments and biological substances status: Secondary | ICD-10-CM

## 2023-03-29 DIAGNOSIS — Z79890 Hormone replacement therapy (postmenopausal): Secondary | ICD-10-CM

## 2023-03-29 DIAGNOSIS — E785 Hyperlipidemia, unspecified: Secondary | ICD-10-CM | POA: Diagnosis present

## 2023-03-29 DIAGNOSIS — I714 Abdominal aortic aneurysm, without rupture, unspecified: Principal | ICD-10-CM | POA: Diagnosis present

## 2023-03-29 DIAGNOSIS — M8580 Other specified disorders of bone density and structure, unspecified site: Secondary | ICD-10-CM | POA: Diagnosis not present

## 2023-03-29 DIAGNOSIS — J309 Allergic rhinitis, unspecified: Secondary | ICD-10-CM | POA: Diagnosis present

## 2023-03-29 DIAGNOSIS — I10 Essential (primary) hypertension: Secondary | ICD-10-CM | POA: Diagnosis not present

## 2023-03-29 DIAGNOSIS — Z885 Allergy status to narcotic agent status: Secondary | ICD-10-CM

## 2023-03-29 DIAGNOSIS — Z9842 Cataract extraction status, left eye: Secondary | ICD-10-CM | POA: Diagnosis not present

## 2023-03-29 DIAGNOSIS — I7143 Infrarenal abdominal aortic aneurysm, without rupture: Secondary | ICD-10-CM | POA: Diagnosis not present

## 2023-03-29 DIAGNOSIS — Z87442 Personal history of urinary calculi: Secondary | ICD-10-CM | POA: Diagnosis not present

## 2023-03-29 DIAGNOSIS — Z87891 Personal history of nicotine dependence: Secondary | ICD-10-CM

## 2023-03-29 DIAGNOSIS — Z7982 Long term (current) use of aspirin: Secondary | ICD-10-CM | POA: Diagnosis not present

## 2023-03-29 DIAGNOSIS — M5030 Other cervical disc degeneration, unspecified cervical region: Secondary | ICD-10-CM | POA: Diagnosis present

## 2023-03-29 HISTORY — PX: ABDOMINAL AORTIC ENDOVASCULAR STENT GRAFT: SHX5707

## 2023-03-29 HISTORY — PX: ULTRASOUND GUIDANCE FOR VASCULAR ACCESS: SHX6516

## 2023-03-29 LAB — BASIC METABOLIC PANEL
Anion gap: 8 (ref 5–15)
BUN: 17 mg/dL (ref 8–23)
CO2: 24 mmol/L (ref 22–32)
Calcium: 8.1 mg/dL — ABNORMAL LOW (ref 8.9–10.3)
Chloride: 107 mmol/L (ref 98–111)
Creatinine, Ser: 0.9 mg/dL (ref 0.61–1.24)
GFR, Estimated: >60 mL/min (ref >60)
Glucose, Bld: 103 mg/dL — ABNORMAL HIGH (ref 70–99)
Potassium: 3.6 mmol/L (ref 3.5–5.1)
Sodium: 139 mmol/L (ref 135–145)

## 2023-03-29 LAB — CBC
HCT: 39.5 % (ref 39.0–52.0)
Hemoglobin: 13.3 g/dL (ref 13.0–17.0)
MCH: 30.6 pg (ref 26.0–34.0)
MCHC: 33.7 g/dL (ref 30.0–36.0)
MCV: 90.8 fL (ref 80.0–100.0)
Platelets: 174 10*3/uL (ref 150–400)
RBC: 4.35 MIL/uL (ref 4.22–5.81)
RDW: 13.2 % (ref 11.5–15.5)
WBC: 11.2 10*3/uL — ABNORMAL HIGH (ref 4.0–10.5)
nRBC: 0 % (ref 0.0–0.2)

## 2023-03-29 LAB — ABO/RH: ABO/RH(D): O POS

## 2023-03-29 LAB — PROTIME-INR
INR: 1.3 — ABNORMAL HIGH (ref 0.8–1.2)
Prothrombin Time: 16 seconds — ABNORMAL HIGH (ref 11.4–15.2)

## 2023-03-29 LAB — APTT: aPTT: 42 seconds — ABNORMAL HIGH (ref 24–36)

## 2023-03-29 LAB — MAGNESIUM: Magnesium: 1.8 mg/dL (ref 1.7–2.4)

## 2023-03-29 SURGERY — INSERTION, ENDOVASCULAR STENT GRAFT, AORTA, ABDOMINAL
Anesthesia: General | Site: Groin | Laterality: Bilateral

## 2023-03-29 MED ORDER — LORATADINE 10 MG PO TABS: 10.0000 mg | ORAL_TABLET | Freq: Every day | ORAL | Status: DC

## 2023-03-29 MED ORDER — ACETAMINOPHEN 650 MG RE SUPP: 325.0000 mg | RECTAL | Status: DC | PRN

## 2023-03-29 MED ORDER — PHENOL 1.4 % MT LIQD: 1.0000 | OROMUCOSAL | Status: DC | PRN

## 2023-03-29 MED ORDER — CHLORHEXIDINE GLUCONATE 0.12 % MT SOLN
OROMUCOSAL | Status: AC
  Administered 2023-03-29: 15 mL via OROMUCOSAL
  Filled 2023-03-29: qty 15

## 2023-03-29 MED ORDER — ALUM & MAG HYDROXIDE-SIMETH 200-200-20 MG/5ML PO SUSP: 15.0000 mL | ORAL | Status: DC | PRN

## 2023-03-29 MED ORDER — ACETAMINOPHEN 325 MG PO TABS: 325.0000 mg | ORAL_TABLET | ORAL | Status: DC | PRN

## 2023-03-29 MED ORDER — HEPARIN SODIUM (PORCINE) 1000 UNIT/ML IJ SOLN
INTRAMUSCULAR | Status: DC | PRN
  Administered 2023-03-29: 9000 [IU] via INTRAVENOUS
  Administered 2023-03-29: 3000 [IU] via INTRAVENOUS

## 2023-03-29 MED ORDER — AMISULPRIDE (ANTIEMETIC) 5 MG/2ML IV SOLN: 10.0000 mg | Freq: Once | INTRAVENOUS | Status: DC | PRN

## 2023-03-29 MED ORDER — ACETAMINOPHEN 325 MG PO TABS: 325.0000 mg | ORAL_TABLET | Freq: Once | ORAL | Status: DC | PRN

## 2023-03-29 MED ORDER — LIDOCAINE 2% (20 MG/ML) 5 ML SYRINGE
INTRAMUSCULAR | Status: DC | PRN
  Administered 2023-03-29: 40 mg via INTRAVENOUS

## 2023-03-29 MED ORDER — ACETAMINOPHEN 160 MG/5ML PO SOLN: 325.0000 mg | Freq: Once | ORAL | Status: DC | PRN

## 2023-03-29 MED ORDER — TRAMADOL HCL 50 MG PO TABS
50.0000 mg | ORAL_TABLET | Freq: Four times a day (QID) | ORAL | Status: DC | PRN
  Administered 2023-03-29: 50 mg via ORAL
  Filled 2023-03-29: qty 1

## 2023-03-29 MED ORDER — EZETIMIBE 10 MG PO TABS
10.0000 mg | ORAL_TABLET | Freq: Every day | ORAL | Status: DC
  Administered 2023-03-30: 10 mg via ORAL
  Filled 2023-03-29: qty 1

## 2023-03-29 MED ORDER — SODIUM CHLORIDE 0.9 % IV SOLN
INTRAVENOUS | Status: DC
  Administered 2023-03-29: 75 mL/h via INTRAVENOUS

## 2023-03-29 MED ORDER — LIDOCAINE 2% (20 MG/ML) 5 ML SYRINGE
INTRAMUSCULAR | Status: AC
  Filled 2023-03-29: qty 5

## 2023-03-29 MED ORDER — CEFAZOLIN SODIUM-DEXTROSE 2-4 GM/100ML-% IV SOLN
2.0000 g | INTRAVENOUS | Status: AC
  Administered 2023-03-29: 2 g via INTRAVENOUS

## 2023-03-29 MED ORDER — POTASSIUM CHLORIDE CRYS ER 20 MEQ PO TBCR: 20.0000 meq | EXTENDED_RELEASE_TABLET | Freq: Every day | ORAL | Status: DC | PRN

## 2023-03-29 MED ORDER — CHLORHEXIDINE GLUCONATE CLOTH 2 % EX PADS: 6.0000 | MEDICATED_PAD | Freq: Once | CUTANEOUS | Status: DC

## 2023-03-29 MED ORDER — ESCITALOPRAM OXALATE 20 MG PO TABS
20.0000 mg | ORAL_TABLET | Freq: Every day | ORAL | Status: DC
  Administered 2023-03-30: 20 mg via ORAL
  Filled 2023-03-29: qty 1

## 2023-03-29 MED ORDER — ROCURONIUM BROMIDE 10 MG/ML (PF) SYRINGE
PREFILLED_SYRINGE | INTRAVENOUS | Status: AC
  Filled 2023-03-29: qty 10

## 2023-03-29 MED ORDER — 0.9 % SODIUM CHLORIDE (POUR BTL) OPTIME
TOPICAL | Status: DC | PRN
  Administered 2023-03-29: 1000 mL

## 2023-03-29 MED ORDER — LACTATED RINGERS IV SOLN: INTRAVENOUS | Status: DC | PRN

## 2023-03-29 MED ORDER — HEPARIN 6000 UNIT IRRIGATION SOLUTION
Status: DC | PRN
  Administered 2023-03-29: 1

## 2023-03-29 MED ORDER — HYDROMORPHONE HCL 1 MG/ML IJ SOLN: 0.5000 mg | INTRAMUSCULAR | Status: DC | PRN

## 2023-03-29 MED ORDER — LACTATED RINGERS IV SOLN: INTRAVENOUS | Status: DC

## 2023-03-29 MED ORDER — PROTAMINE SULFATE 10 MG/ML IV SOLN
INTRAVENOUS | Status: DC | PRN
  Administered 2023-03-29: 50 mg via INTRAVENOUS

## 2023-03-29 MED ORDER — FENTANYL CITRATE (PF) 100 MCG/2ML IJ SOLN: 25.0000 ug | INTRAMUSCULAR | Status: DC | PRN

## 2023-03-29 MED ORDER — ONDANSETRON HCL 4 MG/2ML IJ SOLN
INTRAMUSCULAR | Status: DC | PRN
  Administered 2023-03-29: 4 mg via INTRAVENOUS

## 2023-03-29 MED ORDER — ONDANSETRON HCL 4 MG/2ML IJ SOLN
INTRAMUSCULAR | Status: AC
  Filled 2023-03-29: qty 2

## 2023-03-29 MED ORDER — MECLIZINE HCL 25 MG PO TABS: 25.0000 mg | ORAL_TABLET | Freq: Three times a day (TID) | ORAL | Status: DC | PRN

## 2023-03-29 MED ORDER — HYDRALAZINE HCL 20 MG/ML IJ SOLN: 5.0000 mg | INTRAMUSCULAR | Status: DC | PRN

## 2023-03-29 MED ORDER — HEPARIN SODIUM (PORCINE) 1000 UNIT/ML IJ SOLN
INTRAMUSCULAR | Status: AC
  Filled 2023-03-29: qty 10

## 2023-03-29 MED ORDER — FENTANYL CITRATE (PF) 250 MCG/5ML IJ SOLN
INTRAMUSCULAR | Status: AC
  Filled 2023-03-29: qty 5

## 2023-03-29 MED ORDER — DONEPEZIL HCL 5 MG PO TABS
10.0000 mg | ORAL_TABLET | Freq: Every day | ORAL | Status: DC
  Administered 2023-03-30: 10 mg via ORAL
  Filled 2023-03-29: qty 2

## 2023-03-29 MED ORDER — HEPARIN SODIUM (PORCINE) 5000 UNIT/ML IJ SOLN
5000.0000 [IU] | Freq: Three times a day (TID) | INTRAMUSCULAR | Status: DC
  Administered 2023-03-30: 5000 [IU] via SUBCUTANEOUS
  Filled 2023-03-29: qty 1

## 2023-03-29 MED ORDER — HYDROCHLOROTHIAZIDE 12.5 MG PO TABS
12.5000 mg | ORAL_TABLET | Freq: Every day | ORAL | Status: DC
  Administered 2023-03-30: 12.5 mg via ORAL
  Filled 2023-03-29: qty 1

## 2023-03-29 MED ORDER — PANTOPRAZOLE SODIUM 40 MG PO TBEC
40.0000 mg | DELAYED_RELEASE_TABLET | Freq: Every day | ORAL | Status: DC
  Administered 2023-03-29 – 2023-03-30 (×2): 40 mg via ORAL
  Filled 2023-03-29 (×2): qty 1

## 2023-03-29 MED ORDER — ROSUVASTATIN CALCIUM 20 MG PO TABS
20.0000 mg | ORAL_TABLET | Freq: Every day | ORAL | Status: DC
  Administered 2023-03-29 – 2023-03-30 (×2): 20 mg via ORAL
  Filled 2023-03-29 (×2): qty 1

## 2023-03-29 MED ORDER — PROPOFOL 10 MG/ML IV BOLUS
INTRAVENOUS | Status: AC
  Filled 2023-03-29: qty 20

## 2023-03-29 MED ORDER — METOPROLOL TARTRATE 5 MG/5ML IV SOLN: 2.0000 mg | INTRAVENOUS | Status: DC | PRN

## 2023-03-29 MED ORDER — BISACODYL 5 MG PO TBEC: 5.0000 mg | DELAYED_RELEASE_TABLET | Freq: Every day | ORAL | Status: DC | PRN

## 2023-03-29 MED ORDER — DEXAMETHASONE SODIUM PHOSPHATE 10 MG/ML IJ SOLN
INTRAMUSCULAR | Status: DC | PRN
  Administered 2023-03-29: 5 mg via INTRAVENOUS

## 2023-03-29 MED ORDER — SODIUM CHLORIDE 0.9 % IV SOLN: INTRAVENOUS | Status: DC

## 2023-03-29 MED ORDER — IODIXANOL 320 MG/ML IV SOLN
INTRAVENOUS | Status: DC | PRN
  Administered 2023-03-29: 70 mL via INTRA_ARTERIAL

## 2023-03-29 MED ORDER — AMLODIPINE BESYLATE 5 MG PO TABS
5.0000 mg | ORAL_TABLET | Freq: Every day | ORAL | Status: DC
  Administered 2023-03-30: 5 mg via ORAL
  Filled 2023-03-29: qty 1

## 2023-03-29 MED ORDER — LEVOTHYROXINE SODIUM 25 MCG PO TABS
125.0000 ug | ORAL_TABLET | Freq: Every day | ORAL | Status: DC
  Administered 2023-03-30: 125 ug via ORAL
  Filled 2023-03-29: qty 1

## 2023-03-29 MED ORDER — EPHEDRINE SULFATE-NACL 50-0.9 MG/10ML-% IV SOSY
PREFILLED_SYRINGE | INTRAVENOUS | Status: DC | PRN
  Administered 2023-03-29: 5 mg via INTRAVENOUS

## 2023-03-29 MED ORDER — SENNOSIDES-DOCUSATE SODIUM 8.6-50 MG PO TABS: 1.0000 | ORAL_TABLET | Freq: Every evening | ORAL | Status: DC | PRN

## 2023-03-29 MED ORDER — CHLORHEXIDINE GLUCONATE 0.12 % MT SOLN: 15.0000 mL | Freq: Once | OROMUCOSAL | Status: AC

## 2023-03-29 MED ORDER — SODIUM CHLORIDE 0.9 % IV SOLN: 500.0000 mL | Freq: Once | INTRAVENOUS | Status: DC | PRN

## 2023-03-29 MED ORDER — MAGNESIUM SULFATE 2 GM/50ML IV SOLN: 2.0000 g | Freq: Every day | INTRAVENOUS | Status: DC | PRN

## 2023-03-29 MED ORDER — HEPARIN 6000 UNIT IRRIGATION SOLUTION
Status: AC
  Filled 2023-03-29: qty 500

## 2023-03-29 MED ORDER — ROCURONIUM BROMIDE 10 MG/ML (PF) SYRINGE
PREFILLED_SYRINGE | INTRAVENOUS | Status: DC | PRN
  Administered 2023-03-29: 60 mg via INTRAVENOUS

## 2023-03-29 MED ORDER — ONDANSETRON HCL 4 MG/2ML IJ SOLN: 4.0000 mg | Freq: Four times a day (QID) | INTRAMUSCULAR | Status: DC | PRN

## 2023-03-29 MED ORDER — ORAL CARE MOUTH RINSE: 15.0000 mL | Freq: Once | OROMUCOSAL | Status: AC

## 2023-03-29 MED ORDER — CEFAZOLIN SODIUM-DEXTROSE 2-4 GM/100ML-% IV SOLN
INTRAVENOUS | Status: AC
  Filled 2023-03-29: qty 100

## 2023-03-29 MED ORDER — QUETIAPINE FUMARATE 25 MG PO TABS
25.0000 mg | ORAL_TABLET | Freq: Every day | ORAL | Status: DC
  Administered 2023-03-29: 25 mg via ORAL
  Filled 2023-03-29: qty 1

## 2023-03-29 MED ORDER — DOCUSATE SODIUM 100 MG PO CAPS
100.0000 mg | ORAL_CAPSULE | Freq: Every day | ORAL | Status: DC
  Administered 2023-03-30: 100 mg via ORAL
  Filled 2023-03-29: qty 1

## 2023-03-29 MED ORDER — CEFAZOLIN SODIUM-DEXTROSE 2-4 GM/100ML-% IV SOLN
2.0000 g | Freq: Three times a day (TID) | INTRAVENOUS | Status: AC
  Administered 2023-03-29 – 2023-03-30 (×2): 2 g via INTRAVENOUS
  Filled 2023-03-29 (×2): qty 100

## 2023-03-29 MED ORDER — DEXAMETHASONE SODIUM PHOSPHATE 10 MG/ML IJ SOLN
INTRAMUSCULAR | Status: AC
  Filled 2023-03-29: qty 1

## 2023-03-29 MED ORDER — FENTANYL CITRATE (PF) 250 MCG/5ML IJ SOLN
INTRAMUSCULAR | Status: DC | PRN
  Administered 2023-03-29: 100 ug via INTRAVENOUS
  Administered 2023-03-29: 50 ug via INTRAVENOUS

## 2023-03-29 MED ORDER — PROPOFOL 10 MG/ML IV BOLUS
INTRAVENOUS | Status: DC | PRN
  Administered 2023-03-29: 160 mg via INTRAVENOUS

## 2023-03-29 MED ORDER — LABETALOL HCL 5 MG/ML IV SOLN: 10.0000 mg | INTRAVENOUS | Status: DC | PRN

## 2023-03-29 MED ORDER — ACETAMINOPHEN 10 MG/ML IV SOLN: 1000.0000 mg | Freq: Once | INTRAVENOUS | Status: DC | PRN

## 2023-03-29 MED ORDER — EPHEDRINE 5 MG/ML INJ
INTRAVENOUS | Status: AC
  Filled 2023-03-29: qty 5

## 2023-03-29 MED ORDER — GUAIFENESIN-DM 100-10 MG/5ML PO SYRP: 15.0000 mL | ORAL_SOLUTION | ORAL | Status: DC | PRN

## 2023-03-29 MED ORDER — PROMETHAZINE HCL 25 MG/ML IJ SOLN: 6.2500 mg | INTRAMUSCULAR | Status: DC | PRN

## 2023-03-29 MED ORDER — ASPIRIN 81 MG PO CHEW
81.0000 mg | CHEWABLE_TABLET | Freq: Every day | ORAL | Status: DC
  Administered 2023-03-29 – 2023-03-30 (×2): 81 mg via ORAL
  Filled 2023-03-29 (×2): qty 1

## 2023-03-29 MED ORDER — SUGAMMADEX SODIUM 200 MG/2ML IV SOLN
INTRAVENOUS | Status: DC | PRN
  Administered 2023-03-29: 100 mg via INTRAVENOUS

## 2023-03-29 SURGICAL SUPPLY — 56 items
APL PRP STRL LF DISP 70% ISPRP (MISCELLANEOUS) ×1
APL SKNCLS STERI-STRIP NONHPOA (GAUZE/BANDAGES/DRESSINGS) ×2
BAG COUNTER SPONGE SURGICOUNT (BAG) ×1 IMPLANT
BAG SPNG CNTER NS LX DISP (BAG) ×1
BENZOIN TINCTURE PRP APPL 2/3 (GAUZE/BANDAGES/DRESSINGS) ×1 IMPLANT
BLADE CLIPPER SURG (BLADE) ×1 IMPLANT
CANISTER SUCT 3000ML PPV (MISCELLANEOUS) ×1 IMPLANT
CATH BEACON 5.038 65CM KMP-01 (CATHETERS) ×1 IMPLANT
CATH OMNI FLUSH .035X70CM (CATHETERS) ×1 IMPLANT
CHLORAPREP W/TINT 26 (MISCELLANEOUS) ×1 IMPLANT
DEVICE CLOSURE PERCLS PRGLD 6F (VASCULAR PRODUCTS) IMPLANT
DEVICE TORQUE KENDALL .025-038 (MISCELLANEOUS) IMPLANT
DRSG TEGADERM 2-3/8X2-3/4 SM (GAUZE/BANDAGES/DRESSINGS) ×2 IMPLANT
ELECT REM PT RETURN 9FT ADLT (ELECTROSURGICAL) ×2
ELECTRODE REM PT RTRN 9FT ADLT (ELECTROSURGICAL) ×2 IMPLANT
EXCLDR TRNK ENDO 32X14.5X14 18 (Endovascular Graft) ×1 IMPLANT
EXCLUDER TNK END 32X14.5X14 18 (Endovascular Graft) IMPLANT
GAUZE SPONGE 2X2 8PLY STRL LF (GAUZE/BANDAGES/DRESSINGS) ×2 IMPLANT
GLOVE BIO SURGEON STRL SZ7 (GLOVE) IMPLANT
GLOVE BIO SURGEON STRL SZ8 (GLOVE) ×1 IMPLANT
GLOVE SS BIOGEL STRL SZ 6.5 (GLOVE) IMPLANT
GOWN STRL REUS W/ TWL LRG LVL3 (GOWN DISPOSABLE) ×2 IMPLANT
GOWN STRL REUS W/ TWL XL LVL3 (GOWN DISPOSABLE) ×1 IMPLANT
GOWN STRL REUS W/TWL LRG LVL3 (GOWN DISPOSABLE) ×2
GOWN STRL REUS W/TWL XL LVL3 (GOWN DISPOSABLE) ×1
GRAFT BALLN CATH 65CM (BALLOONS) ×1 IMPLANT
GUIDEWIRE ANGLED .035X150CM (WIRE) IMPLANT
KIT BASIN OR (CUSTOM PROCEDURE TRAY) ×1 IMPLANT
KIT DRAIN CSF ACCUDRAIN (MISCELLANEOUS) IMPLANT
KIT TURNOVER KIT B (KITS) ×1 IMPLANT
LEG CONTRALATERAL 23X10 (Vascular Products) IMPLANT
LEG CONTRALATERAL 23X12 (Endovascular Graft) IMPLANT
NS IRRIG 1000ML POUR BTL (IV SOLUTION) ×1 IMPLANT
PACK ENDOVASCULAR (PACKS) ×1 IMPLANT
PAD ARMBOARD 7.5X6 YLW CONV (MISCELLANEOUS) ×2 IMPLANT
PERCLOSE PROGLIDE 6F (VASCULAR PRODUCTS) ×8
SET MICROPUNCTURE 5F STIFF (MISCELLANEOUS) ×1 IMPLANT
SHEATH BRITE TIP 8FR 23CM (SHEATH) ×1 IMPLANT
SHEATH DRYSEAL FLEX 14FR 33CM (SHEATH) IMPLANT
SHEATH DRYSEAL FLEX 18FR 33CM (SHEATH) IMPLANT
SHEATH PINNACLE 8F 10CM (SHEATH) ×1 IMPLANT
STOPCOCK MORSE 400PSI 3WAY (MISCELLANEOUS) ×1 IMPLANT
STRIP CLOSURE SKIN 1/2X4 (GAUZE/BANDAGES/DRESSINGS) ×2 IMPLANT
SUT MNCRL AB 4-0 PS2 18 (SUTURE) ×2 IMPLANT
SUT PROLENE 5 0 C 1 24 (SUTURE) IMPLANT
SUT VIC AB 2-0 CT1 27 (SUTURE)
SUT VIC AB 2-0 CT1 TAPERPNT 27 (SUTURE) IMPLANT
SUT VIC AB 3-0 SH 27 (SUTURE)
SUT VIC AB 3-0 SH 27X BRD (SUTURE) IMPLANT
SYR 20CC LL (SYRINGE) ×2 IMPLANT
TAPE STRIPS DRAPE STRL (GAUZE/BANDAGES/DRESSINGS) IMPLANT
TOWEL GREEN STERILE (TOWEL DISPOSABLE) ×1 IMPLANT
TRAY FOLEY MTR SLVR 16FR STAT (SET/KITS/TRAYS/PACK) ×1 IMPLANT
TUBING INJECTOR 48 (MISCELLANEOUS) ×1 IMPLANT
WIRE BENTSON .035X145CM (WIRE) ×2 IMPLANT
WIRE LUNDERQUIST .035X180CM (WIRE) ×2 IMPLANT

## 2023-03-29 NOTE — Interval H&P Note (Signed)
History and Physical Interval Note:  03/29/2023 7:17 AM  Brandon Valdez  has presented today for surgery, with the diagnosis of Infrarenal abdominal aortic aneurysm without rupture.  The various methods of treatment have been discussed with the patient and family. After consideration of risks, benefits and other options for treatment, the patient has consented to  Procedure(s): ABDOMINAL AORTIC ENDOVASCULAR STENT GRAFT (N/A) as a surgical intervention.  The patient's history has been reviewed, patient examined, no change in status, stable for surgery.  I have reviewed the patient's chart and labs.  Questions were answered to the patient's satisfaction.     Leonie Douglas

## 2023-03-29 NOTE — Anesthesia Procedure Notes (Signed)
Procedure Name: Intubation Date/Time: 03/29/2023 7:43 AM  Performed by: Laruth Bouchard., CRNAPre-anesthesia Checklist: Patient identified, Emergency Drugs available, Suction available, Patient being monitored and Timeout performed Patient Re-evaluated:Patient Re-evaluated prior to induction Oxygen Delivery Method: Circle system utilized Preoxygenation: Pre-oxygenation with 100% oxygen Induction Type: IV induction Ventilation: Mask ventilation without difficulty Laryngoscope Size: Glidescope and 4 Grade View: Grade I Tube type: Oral Tube size: 7.5 mm Number of attempts: 1 Airway Equipment and Method: Rigid stylet and Video-laryngoscopy Placement Confirmation: ETT inserted through vocal cords under direct vision, positive ETCO2 and breath sounds checked- equal and bilateral Secured at: 22 cm Tube secured with: Tape Dental Injury: Teeth and Oropharynx as per pre-operative assessment

## 2023-03-29 NOTE — Progress Notes (Signed)
Patient described some difficulties with ambulation, due to his right knee.  Patient stood at the bedside and sat in the bedside chair.  He was unsteady on his feet.  He said that he has not been using a walker, before admission.

## 2023-03-29 NOTE — Transfer of Care (Signed)
Immediate Anesthesia Transfer of Care Note  Patient: Brandon Valdez  Procedure(s) Performed: ABDOMINAL AORTIC ENDOVASCULAR STENT GRAFT (Bilateral: Groin) ULTRASOUND GUIDANCE FOR VASCULAR ACCESS (Bilateral: Groin)  Patient Location: PACU  Anesthesia Type:General  Level of Consciousness: awake, alert , and oriented  Airway & Oxygen Therapy: Patient Spontanous Breathing and Patient connected to nasal cannula oxygen  Post-op Assessment: Report given to RN and Post -op Vital signs reviewed and stable  Post vital signs: Reviewed and stable  Last Vitals:  Vitals Value Taken Time  BP 134/102 03/29/23 0935  Temp    Pulse 66 03/29/23 0940  Resp 15 03/29/23 0940  SpO2 95 % 03/29/23 0940  Vitals shown include unvalidated device data.  Last Pain:  Vitals:   03/29/23 0701  TempSrc:   PainSc: 10-Worst pain ever         Complications: No notable events documented.

## 2023-03-29 NOTE — Op Note (Signed)
DATE OF SERVICE: 03/29/2023  PATIENT:  Brandon Valdez  89 y.o. male  PRE-OPERATIVE DIAGNOSIS:  55mm infrarenal abdominal aortic aneurysm  POST-OPERATIVE DIAGNOSIS:  Same  PROCEDURE:   1) ultrasound guided percutaneous access and large bore closure of bilateral common femoral arteries (CPT 34713) 2) selective catheter placement in aorta (CPT [phone]) 3) deployment of aorto-bi-iliac endograft (CPT [id number])  SURGEON:  Surgeon(s) and Role:    * Leonie Douglas, MD - Primary  ASSISTANT: Nathanial Rancher, PA-C  An experienced assistant was required given the complexity of this procedure and the standard of surgical care. My assistant helped with exposure through counter tension, suctioning, ligation and retraction to better visualize the surgical field.  My assistant expedited sewing during the case by following my sutures. Wherever I use the term "we" in the report, my assistant actively helped me with that portion of the procedure.  ANESTHESIA:   general  EBL:  BLOOD ADMINISTERED:none  DRAINS: none   LOCAL MEDICATIONS USED:  NONE  SPECIMEN:  none  COUNTS: confirmed correct.  TOURNIQUET:  none  PATIENT DISPOSITION:  PACU - hemodynamically stable.   Delay start of Pharmacological VTE agent (>24hrs) due to surgical blood loss or risk of bleeding: no  INDICATION FOR PROCEDURE: Brandon Valdez is a 89 y.o. male with 55mm infrarenal abdominal aortic aneurysm. After careful discussion of risks, benefits, and alternatives the patient was offered endovascular repair.  The patient understood and wished to proceed.  OPERATIVE FINDINGS: successful endovascular repair of AAA. No endoleak at completion. Good doppler flow in feet at completion.  ENDOVASCULAR EQUIPMENT USED: Via right common femoral artery access (18 French dry seal) 32 x 14 x 140 mm Gore excluder main body 23 x 100 mm Gore iliac extension limb  The left common femoral artery access (14 French dry seal) 23 x 120 mm  iliac extension limb  DESCRIPTION OF PROCEDURE: After identification of the patient in the pre-operative holding area, the patient was transferred to the operating room. The patient was positioned supine on the operating room table. Anesthesia was induced. The abdomen and groins were prepped and draped in standard fashion. A surgical pause was performed confirming correct patient, procedure, and operative location.  Using ultrasound guidance, the right common femoral artery was accessed with micropuncture technique.  Micro sheath was introduced into the common femoral artery with Seldinger technique.  A Bentson wire was navigated into the perivisceral aorta.  An 8 French sheath dilator was used to dilate the arteriotomy.  Perclose sutures were placed at 10:00 and 2:00 and secured with shod clamps for use at the end of the case.  Access was upsized to 8 [place].  Using ultrasound guidance, the left common femoral artery was accessed with micropuncture technique.  Micro sheath was introduced into the common femoral artery with Seldinger technique.  A Bentson wire was navigated into the perivisceral aorta.  An 8 French sheath dilator was used to dilate the arteriotomy.  Perclose sutures were placed at 10:00 and 2:00 and secured with shod clamps for use at the end of the case.  Access was upsized to 8 [place].  An Omni Flush catheter was advanced via the right common femoral artery access into the perivisceral aorta.  The wire was exchanged for a Lunderquist wire.  Access was upsized to 18 [place] dry seal sheath on the right.  An Omni Flush catheter was advanced via the left common femoral artery access into the perivisceral aorta.  The wire was exchanged for  a Lunderquist wire.  Access was upsized to 14 [place] dry seal sheath on the left.  The patient was systemically heparinized.  Activated clotting time measurements were used for the case to confirm adequate anticoagulation.  The main body of the  endoprosthesis was delivered via the right common femoral artery to the level of L1.  Via the left access, an Omni Flush catheter was positioned in the perivisceral aorta.  Aortogram was performed.  The renal anatomy was defined.  The main body was partially deployed to allow the gate to open.  The Omni Flush catheter was removed over the Lunderquist wire.  The Lunderquist wire was left in place.  Using "buddy wire" technique the gate was cannulated with a Glidewire.  I advanced the Omni Flush catheter into the main body of the endoprosthesis.  This spun freely.  Satisfied, the catheter was advanced above the endoprosthesis.  The catheter was used to measure the length from the gate to hypogastric artery. A retrograde angiogram was performed. The left iliac limb was deployed in standard fashion.  We then performed a retrograde angiogram from the right sheath. The right hypogastric artery was identified. The right limb was deployed in standard fashion.   All areas of seal and overlap were angioplastied with molding and occlusion balloon.   A completion angiogram was performed. No evidence of endoleak was identified.   All endovascular equipment was removed from the right access. Manual pressure was held on the arteriotomy. The perclose devices were used to achieve hemostasis. Good hemostasis was achieved. Good doppler flow was confirmed in the foot.  All endovascular equipment was removed from the left access. Manual pressure was held on the arteriotomy. The perclose devices were used to achieve hemostasis. Good hemostasis was achieved. Good doppler flow was confirmed in the foot.  The groins were closed with monocryl. Heparin was reversed with protamine.  Upon completion of the case instrument and sharps counts were confirmed correct. The patient was transferred to the PACU in good condition. I was present for all portions of the procedure.  FOLLOW UP PLAN: Assuming a normal postoperative course, I  will see the patient in 4 weeks with CT angiogram of the abdomen and pelvis.   Brandon Brunt. Lenell Antu, MD [facility] Phone Number: [phone] 03/29/2023 9:29 AM

## 2023-03-29 NOTE — Anesthesia Postprocedure Evaluation (Signed)
Anesthesia Post Note  Patient: Brandon Valdez  Procedure(s) Performed: ABDOMINAL AORTIC ENDOVASCULAR STENT GRAFT (Bilateral: Groin) ULTRASOUND GUIDANCE FOR VASCULAR ACCESS (Bilateral: Groin)     Patient location during evaluation: PACU Anesthesia Type: General Level of consciousness: awake and alert Pain management: pain level controlled Vital Signs Assessment: post-procedure vital signs reviewed and stable Respiratory status: spontaneous breathing, nonlabored ventilation, respiratory function stable and patient connected to nasal cannula oxygen Cardiovascular status: blood pressure returned to baseline and stable Postop Assessment: no apparent nausea or vomiting Anesthetic complications: no  No notable events documented.  Last Vitals:  Vitals:   03/29/23 1500 03/29/23 1609  BP: 137/69   Pulse: 68 69  Resp: 19 (!) 23  Temp:  36.6 C  SpO2:  98%    Last Pain:  Vitals:   03/29/23 1609  TempSrc: Oral  PainSc: 0-No pain                 Shelton Silvas

## 2023-03-29 NOTE — Progress Notes (Signed)
Patient ambulated in the hall.  He did not require a walker and his gait was steady.  He tolerated the ambulation well.

## 2023-03-30 LAB — POCT I-STAT 7, (LYTES, BLD GAS, ICA,H+H)
Acid-Base Excess: 0 mmol/L (ref 0.0–2.0)
Bicarbonate: 25.4 mmol/L (ref 20.0–28.0)
Calcium, Ion: 1.19 mmol/L (ref 1.15–1.40)
HCT: 36 % — ABNORMAL LOW (ref 39.0–52.0)
Hemoglobin: 12.2 g/dL — ABNORMAL LOW (ref 13.0–17.0)
O2 Saturation: 100 %
Potassium: 3.8 mmol/L (ref 3.5–5.1)
Sodium: 141 mmol/L (ref 135–145)
TCO2: 27 mmol/L (ref 22–32)
pCO2 arterial: 42.5 mmHg (ref 32–48)
pH, Arterial: 7.385 (ref 7.35–7.45)
pO2, Arterial: 240 mmHg — ABNORMAL HIGH (ref 83–108)

## 2023-03-30 LAB — CBC
HCT: 37.6 % — ABNORMAL LOW (ref 39.0–52.0)
Hemoglobin: 12.5 g/dL — ABNORMAL LOW (ref 13.0–17.0)
MCH: 30.9 pg (ref 26.0–34.0)
MCHC: 33.2 g/dL (ref 30.0–36.0)
MCV: 93.1 fL (ref 80.0–100.0)
Platelets: 147 10*3/uL — ABNORMAL LOW (ref 150–400)
RBC: 4.04 MIL/uL — ABNORMAL LOW (ref 4.22–5.81)
RDW: 13.3 % (ref 11.5–15.5)
WBC: 11.6 10*3/uL — ABNORMAL HIGH (ref 4.0–10.5)
nRBC: 0 % (ref 0.0–0.2)

## 2023-03-30 LAB — BASIC METABOLIC PANEL
Anion gap: 9 (ref 5–15)
BUN: 18 mg/dL (ref 8–23)
CO2: 25 mmol/L (ref 22–32)
Calcium: 8.1 mg/dL — ABNORMAL LOW (ref 8.9–10.3)
Chloride: 103 mmol/L (ref 98–111)
Creatinine, Ser: 0.85 mg/dL (ref 0.61–1.24)
GFR, Estimated: >60 mL/min (ref >60)
Glucose, Bld: 108 mg/dL — ABNORMAL HIGH (ref 70–99)
Potassium: 4 mmol/L (ref 3.5–5.1)
Sodium: 137 mmol/L (ref 135–145)

## 2023-03-30 LAB — POCT ACTIVATED CLOTTING TIME
Activated Clotting Time: 212 seconds
Activated Clotting Time: 244 seconds

## 2023-03-30 MED ORDER — TRAMADOL HCL 50 MG PO TABS: 50.0000 mg | ORAL_TABLET | Freq: Three times a day (TID) | ORAL | 0 refills | Status: DC | PRN

## 2023-03-30 NOTE — Discharge Summary (Signed)
EVAR Discharge Summary   Brandon Valdez December 10, 1933 89 y.o. male  MRN: [id number]  Admission Date: 03/29/2023  Discharge Date: 04/02/2023   Physician: No att. providers found  Admission Diagnosis: Abdominal aortic aneurysm (AAA) greater than 5.5 cm in diameter in male [facility]) [I71.40]   HPI:   This is a 89 y.o. male returns to clinic to discuss enlarging abdominal aortic aneurysm.  The patient has been followed by our group for many years and has seen slow growth of the aneurysm.  Over the past 12 months he is grown from 4.6 cm to 5.3 cm.  The patient is planned to undergo right total knee arthroplasty, but this has been delayed for definitive management of the aneurysm.  We reviewed the 2 main ways of repairing aneurysms.  We reviewed the rationale for CT angiogram to help plan the repair.   Hospital Course:  The patient was admitted to the hospital and taken to the operating room on 03/29/2023 and underwent: 1) ultrasound guided percutaneous access and large bore closure of bilateral common femoral arteries (CPT 34713) 2) selective catheter placement in aorta (CPT [phone]) 3) deployment of aorto-bi-iliac endograft (CPT [phone])    Findings:  successful endovascular repair of AAA. No endoleak at completion. Good doppler flow in feet at completion   The pt tolerated the procedure well and was transported to the PACU in good condition.   By POD 1, pt was doing well.  He had palpable pedal pulses, groins looked fine, he had ambulated.  Renal function normal.     CBC    Component Value Date/Time   WBC 11.6 (H) 03/30/2023 0600   RBC 4.04 (L) 03/30/2023 0600   HGB 12.5 (L) 03/30/2023 0600   HCT 37.6 (L) 03/30/2023 0600   PLT 147 (L) 03/30/2023 0600   MCV 93.1 03/30/2023 0600   MCH 30.9 03/30/2023 0600   MCHC 33.2 03/30/2023 0600   RDW 13.3 03/30/2023 0600    BMET    Component Value Date/Time   NA 137 03/30/2023 0600   NA 139 01/29/2020 1016   K 4.0 03/30/2023 0600   CL  103 03/30/2023 0600   CO2 25 03/30/2023 0600   GLUCOSE 108 (H) 03/30/2023 0600   BUN 18 03/30/2023 0600   BUN 11 01/29/2020 1016   CREATININE 0.85 03/30/2023 0600   CALCIUM 8.1 (L) 03/30/2023 0600   GFRNONAA >60 03/30/2023 0600   GFRAA 92 01/29/2020 1016       Discharge Instructions     Discharge patient   Complete by: As directed    Discharge disposition: 01-Home or Self Care   Discharge patient date: 03/30/2023       Discharge Diagnosis:  Abdominal aortic aneurysm (AAA) greater than 5.5 cm in diameter in male [facility]) [I71.40]  Secondary Diagnosis: Patient Active Problem List   Diagnosis Date Noted   Abdominal aortic aneurysm (AAA) greater than 5.5 cm in diameter in male [facility]) 03/29/2023   Acute diverticulitis 05/15/2022   Rectal bleeding 05/14/2022   Osteoarthritis of knee 02/21/2022   Memory loss 02/21/2022   Allergic rhinitis 01/18/2021   Benign prostatic hyperplasia 01/18/2021   Chronic kidney disease due to hypertension 01/18/2021   ED (erectile dysfunction) of organic origin 01/18/2021   Essential hypertension 01/18/2021   Hyperlipidemia 01/18/2021   Hypothyroidism 01/18/2021   Left bundle branch block 01/18/2021   Major depression, single episode 01/18/2021   Microscopic hematuria 01/18/2021   Osteopenia 01/18/2021   Pulmonary nodule 01/18/2021   Tobacco user 01/18/2021  Peripheral vascular disease (HCC) 06/02/2019   Bilateral carpal tunnel syndrome 05/01/2019   DDD (degenerative disc disease), cervical 01/28/2019   Bilateral hand pain 01/22/2019   AAA (abdominal aortic aneurysm) without rupture (HCC) 01/20/2019   Neck pain 01/14/2018   Presbycusis of both ears 01/14/2018   Referred otalgia of left ear 01/14/2018   Allergy to pollen 11/23/2016   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 11/23/2016   Vasomotor rhinitis 11/23/2016   Vestibular neuritis, unspecified laterality 11/23/2016   Past Medical History:   Diagnosis Date   AAA (abdominal aortic aneurysm) (HCC)    Arthritis    History of kidney stones    Hyperlipidemia    Hypertension    Hypothyroid    Nephrolithiasis    PUD (peptic ulcer disease)      Allergies as of 03/30/2023       Reactions   Losartan Potassium Cough   Monopril [fosinopril] Other (See Comments)   Dizziness    Valium [diazepam] Other (See Comments)   Shaking, flu like symptoms         Medication List     TAKE these medications    amLODipine 5 MG tablet Commonly known as: NORVASC Take 5 mg by mouth daily.   aspirin 81 MG tablet Take 1 tablet (81 mg total) by mouth daily. May resume after 1 week on July 4 What changed: additional instructions   CALCIUM 600 PO Take 1 tablet by mouth daily.   cetirizine 10 MG tablet Commonly known as: ZYRTEC Take 10 mg by mouth daily.   cyclobenzaprine 10 MG tablet Commonly known as: FLEXERIL Take 1 tablet (10 mg total) by mouth 3 (three) times daily as needed for muscle spasms.   donepezil 10 MG tablet Commonly known as: ARICEPT Take 1 tablet (10 mg total) by mouth daily.   escitalopram 20 MG tablet Commonly known as: LEXAPRO Take 20 mg by mouth daily.   ezetimibe 10 MG tablet Commonly known as: ZETIA TAKE 1 TABLET BY MOUTH DAILY   hydrochlorothiazide 12.5 MG tablet Commonly known as: HYDRODIURIL Take 12.5 mg by mouth daily.   ibuprofen 200 MG tablet Commonly known as: ADVIL Take 200 mg by mouth every 6 (six) hours as needed for moderate pain (inflammation).   levothyroxine 125 MCG tablet Commonly known as: SYNTHROID Take 125 mcg by mouth daily.   meclizine 25 MG tablet Commonly known as: ANTIVERT Take 25 mg by mouth 3 (three) times daily as needed for dizziness.   multivitamin capsule Take 1 capsule by mouth daily.   psyllium 58.6 % packet Commonly known as: METAMUCIL Take 1 packet by mouth daily.   QUEtiapine 25 MG tablet Commonly known as: SEROQUEL Take 25 mg by mouth at bedtime.    rosuvastatin 20 MG tablet Commonly known as: CRESTOR TAKE 1 TABLET BY MOUTH DAILY   Saw Palmetto 450 MG Caps Take 450 mg by mouth daily.   traMADol 50 MG tablet Commonly known as: Ultram Take 1 tablet (50 mg total) by mouth every 8 (eight) hours as needed.        Discharge Instructions:  [facility]  Discharge Instructions Endovascular Aortic Aneurysm Repair  Please refer to the following instructions for your post-procedure care. Your surgeon or Physician Assistant will discuss any changes with you.  Activity  You are encouraged to walk as much as you can. You can slowly return to normal activities but must avoid strenuous activity and heavy  lifting until your doctor tells you it's OK. Avoid activities such as vacuuming or swinging a gold club. It is normal to feel tired for several weeks after your surgery. Do not drive until your doctor gives the OK and you are no longer taking prescription pain medications. It is also normal to have difficulty with sleep habits, eating, and bowel movements after surgery. These will go away with time.  Bathing/Showering  You may shower after you go home. If you have an incision, do not soak in a bathtub, hot tub, or swim until the incision heals completely.  Incision Care  Shower every day. Clean your incision with mild soap and water. Pat the area dry with a clean towel. You do not need a bandage unless otherwise instructed. Do not apply any ointments or creams to your incision. If you clothing is irritating, you may cover your incision with a dry gauze pad.  Diet  Resume your normal diet. There are no special food restrictions following this procedure. A low fat/low cholesterol diet is recommended for all patients with vascular disease. In order to heal from your surgery, it is CRITICAL to get adequate nutrition. Your body requires vitamins, minerals, and protein. Vegetables are the best source of vitamins  and minerals. Vegetables also provide the perfect balance of protein. Processed food has little nutritional value, so try to avoid this.  Medications  Resume taking all of your medications unless your doctor or Physician Assistnat tells you not to. If your incision is causing pain, you may take over-the-counter pain relievers such as acetaminophen (Tylenol). If you were prescribed a stronger pain medication, please be aware these medications can cause nausea and constipation. Prevent nausea by taking the medication with a snack or meal. Avoid constipation by drinking plenty of fluids and eating foods with a high amount of fiber, such as fruits, vegetables, and grains.  Do not take Tylenol if you are taking prescription pain medications.   Follow up  Our office will schedule a follow-up appointment with a C.T. scan 3-4 weeks after your surgery.  Please call us immediately for any of the following conditions  Severe or worsening pain in your legs or feet or in your abdomen back or chest. Increased pain, redness, drainage (pus) from your incision sit. Increased abdominal pain, bloating, nausea, vomiting or persistent diarrhea. Fever of 101 degrees or higher. Swelling in your leg (s),  Reduce your risk of vascular disease  Stop smoking. If you would like help call [facility] at 1-800-QUIT-NOW (9076102575) or [place] at [phone]. Manage your cholesterol Maintain a desired weight Control your diabetes Keep your blood pressure down  If you have questions, please call the office at [phone].   Prescriptions given: 1.  Tramadol #6 No Refill  Disposition: home  Patient's condition: is Good  Follow up: 1. Dr. Lenell Antu in 4 weeks with CTA protocol   Doreatha Massed, PA-C [facility] [phone] 04/02/2023  9:51 AM   - For VQI Registry use - Post-op:  Time to Extubation: [x]  In OR, [ ]  < 12 hrs, [ ]  12-24 hrs, [ ]  >=24 hrs Vasopressors Req.  Post-op: No MI: No., [ ]  Troponin only, [ ]  EKG or Clinical New Arrhythmia: No CHF: No ICU Stay: 1 day in progressive Transfusion: No     If yes, n/a units given  Complications: Resp failure: No., [ ]  Pneumonia, [ ]  Ventilator Chg in renal function: No., [ ]  Inc. Cr > 0.5, [ ]  Temp. Dialysis,  [ ]   Permanent dialysis Leg ischemia: No., no Surgery needed, [ ]  Yes, Surgery needed,  [ ]  Amputation Bowel ischemia: No., [ ]  Medical Rx, [ ]  Surgical Rx Wound complication: No., [ ]  Superficial separation/infection, [ ]  Return to OR Return to OR: No  Return to OR for bleeding: No Stroke: No., [ ]  Minor, [ ]  Major  Discharge medications: Statin use:  Yes  ASA use:  Yes  Plavix use:  No  Beta blocker use:  No  ARB use:  No ACEI use:  No CCB use:  Yes

## 2023-03-30 NOTE — Progress Notes (Addendum)
  Progress Note    03/30/2023 6:38 AM 1 Day Post-Op  Subjective:  says he has been up to the bathroom by himself.  Not much pain but did have a pain pill this morning.   Afebrile HR 40's-60's SB 110's-120's systolic 95% RA  Vitals:   03/29/23 2033 03/30/23 0406  BP: (!) 115/51 128/71  Pulse: (!) 57 (!) 59  Resp: 18 15  Temp: 97.6 F (36.4 C) (!) 97.5 F (36.4 C)  SpO2: 96% 95%    Physical Exam: General:  no distress Cardiac:  regular Lungs:  non labored Incisions:  bilateral groins are soft Extremities:  palpable right DP and left PT pulses Abdomen:  soft  CBC    Component Value Date/Time   WBC 11.6 (H) 03/30/2023 0600   RBC 4.04 (L) 03/30/2023 0600   HGB 12.5 (L) 03/30/2023 0600   HCT 37.6 (L) 03/30/2023 0600   PLT 147 (L) 03/30/2023 0600   MCV 93.1 03/30/2023 0600   MCH 30.9 03/30/2023 0600   MCHC 33.2 03/30/2023 0600   RDW 13.3 03/30/2023 0600    BMET    Component Value Date/Time   NA 137 03/30/2023 0600   NA 139 01/29/2020 1016   K 4.0 03/30/2023 0600   CL 103 03/30/2023 0600   CO2 25 03/30/2023 0600   GLUCOSE 108 (H) 03/30/2023 0600   BUN 18 03/30/2023 0600   BUN 11 01/29/2020 1016   CREATININE 0.85 03/30/2023 0600   CALCIUM 8.1 (L) 03/30/2023 0600   GFRNONAA >60 03/30/2023 0600   GFRAA 92 01/29/2020 1016    INR    Component Value Date/Time   INR 1.3 (H) 03/29/2023 1023     Intake/Output Summary (Last 24 hours) at 03/30/2023 1610 Last data filed at 03/30/2023 0300 Gross per 24 hour  Intake 3024.35 ml  Output 775 ml  Net 2249.35 ml      Assessment/Plan:  89 y.o. male is s/p:  EVAR  1 Day Post-Op   -pt doing well s/p EVAR with palpable pedal pulses. Bilateral groins look fine.  He has ambulated and voided.   -renal function normal  -discharge home today and f/u with Dr. Lenell Antu in 4 weeks with CTA.     Doreatha Massed, PA-C [facility] [phone] 03/30/2023 6:38 AM   VASCULAR STAFF ADDENDUM: I have  independently interviewed and examined the patient. I agree with the above.  Looks great s/p EVAR. Ready for DC. Follow up with me in 1 month with CT angiogram of abdomen / pelvis  Rande Brunt. Lenell Antu, MD [facility] Phone Number: [phone] 03/30/2023 9:20 AM

## 2023-03-30 NOTE — Discharge Instructions (Signed)
  [facility] [place]   Discharge Instructions  Endovascular Aortic Aneurysm Repair  Please refer to the following instructions for your post-procedure care. Your surgeon or Physician Assistant will discuss any changes with you.  Activity  You are encouraged to walk as much as you can. You can slowly return to normal activities but must avoid strenuous activity and heavy lifting until your doctor tells you it's OK. Avoid activities such as vacuuming or swinging a gold club. It is normal to feel tired for several weeks after your surgery. Do not drive until your doctor gives the OK and you are no longer taking prescription pain medications. It is also normal to have difficulty with sleep habits, eating, and bowel movements after surgery. These will go away with time.  Bathing/Showering  Shower daily after you go home.  Do not soak in a bathtub, hot tub, or swim until the incision heals completely.  If you have incisions in your groin, wash the groin wounds with soap and water daily and pat dry. (No tub bath-only shower)  Then put a dry gauze or washcloth there to keep this area dry to help prevent wound infection daily and as needed.  Do not use Vaseline or neosporin on your incisions.  Only use soap and water on your incisions and then protect and keep dry.  Incision Care  Shower every day. Clean your incision with mild soap and water. Pat the area dry with a clean towel. You do not need a bandage unless otherwise instructed. Do not apply any ointments or creams to your incision. If you clothing is irritating, you may cover your incision with a dry gauze pad.  Diet  Resume your normal diet. There are no special food restrictions following this procedure. A low fat/low cholesterol diet is recommended for all patients with vascular disease. In order to heal from your surgery, it is CRITICAL to get adequate nutrition. Your body requires vitamins, minerals, and protein.  Vegetables are the best source of vitamins and minerals. Vegetables also provide the perfect balance of protein. Processed food has little nutritional value, so try to avoid this.  Medications  Resume taking all of your medications unless your doctor or nurse practitioner tells you not to. If your incision is causing pain, you may take over-the-counter pain relievers such as acetaminophen (Tylenol). If you were prescribed a stronger pain medication, please be aware these medications can cause nausea and constipation. Prevent nausea by taking the medication with a snack or meal. Avoid constipation by drinking plenty of fluids and eating foods with a high amount of fiber, such as fruits, vegetables, and grains.  Do not take Tylenol if you are taking prescription pain medications.   Follow up  Our office will schedule a follow-up appointment with a CT scan 3-4 weeks after your surgery.  Please call us immediately for any of the following conditions  Severe or worsening pain in your legs or feet or in your abdomen back or chest. Increased pain, redness, drainage (pus) from your incision site. Increased abdominal pain, bloating, nausea, vomiting or persistent diarrhea. Fever of 101 degrees or higher. Swelling in your leg (s),  Reduce your risk of vascular disease  Stop smoking. If you would like help call [facility] at 1-800-QUIT-NOW (1-800-784-8669) or [place] at [phone]. Manage your cholesterol Maintain a desired weight Control your diabetes Keep your blood pressure down  If you have questions, please call the office at [phone].  

## 2023-04-04 ENCOUNTER — Encounter (HOSPITAL_COMMUNITY): Payer: Self-pay | Admitting: Vascular Surgery

## 2023-04-05 ENCOUNTER — Other Ambulatory Visit: Payer: Self-pay

## 2023-04-05 DIAGNOSIS — I714 Abdominal aortic aneurysm, without rupture, unspecified: Secondary | ICD-10-CM

## 2023-04-19 DIAGNOSIS — H524 Presbyopia: Secondary | ICD-10-CM | POA: Diagnosis not present

## 2023-04-22 ENCOUNTER — Other Ambulatory Visit: Payer: Self-pay | Admitting: Psychiatry

## 2023-04-26 ENCOUNTER — Ambulatory Visit (HOSPITAL_COMMUNITY)
Admission: RE | Admit: 2023-04-26 | Discharge: 2023-04-26 | Disposition: A | Discharge status: Home / Self Care | Payer: Medicare Other | Source: Ambulatory Visit | Attending: Vascular Surgery | Admitting: Vascular Surgery

## 2023-04-26 DIAGNOSIS — I714 Abdominal aortic aneurysm, without rupture, unspecified: Secondary | ICD-10-CM | POA: Diagnosis not present

## 2023-04-26 DIAGNOSIS — I7143 Infrarenal abdominal aortic aneurysm, without rupture: Secondary | ICD-10-CM | POA: Diagnosis not present

## 2023-04-26 MED ORDER — IOHEXOL 350 MG/ML SOLN
100.0000 mL | Freq: Once | INTRAVENOUS | Status: AC | PRN
  Administered 2023-04-26: 100 mL via INTRAVENOUS

## 2023-04-30 NOTE — Progress Notes (Unsigned)
[facility] [place]  ASSESSMENT / PLAN: [name] is a 89 y.o. male status post EVAR 03/29/23 for 55mm infrarenal abdominal aortic aneurysm  Recommend: Complete cessation from all tobacco products. Blood glucose control with goal A1c < 7%. Blood pressure control with goal blood pressure < 140/90 mmHg. Lipid reduction therapy with goal LDL-C <100 mg/dL. Aspirin 81mg  PO QD.  Atorvastatin 40-80mg  PO QD (or other "high intensity" statin therapy).  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 Check CT angiogram of abdomen / pelvis again in 6 months.  CHIEF COMPLAINT: growth of AAA  HISTORY OF PRESENT ILLNESS: Brandon Valdez is a 89 y.o. male turns to clinic to discuss enlarging abdominal aortic aneurysm.  The patient has been followed by our group for many years and has seen slow growth of the aneurysm.  Over the past 12 months he is grown from 4.6 cm to 5.3 cm.  The patient is planned to undergo right total knee arthroplasty, but this has been delayed for definitive management of the aneurysm.  We reviewed the 2 main ways of repairing aneurysms.  We reviewed the rationale for CT angiogram to help plan the repair.  04/30/23: Patient returns to clinic after EVAR.  He had a CT angiogram for surveillance.  This showed a type II endoleak.  We reviewed this finding and the rationale for closer surveillance with an additional CT scan in 6 months.  He reports some residual groin soreness, but otherwise feels great.  Past Medical History:  Diagnosis Date   AAA (abdominal aortic aneurysm) (HCC)    Arthritis    History of kidney stones    Hyperlipidemia    Hypertension    Hypothyroid    Nephrolithiasis    PUD (peptic ulcer disease)     Past Surgical History:  Procedure Laterality Date   ABDOMINAL AORTIC ENDOVASCULAR STENT GRAFT Bilateral 03/29/2023   Procedure: ABDOMINAL AORTIC ENDOVASCULAR STENT GRAFT;  Surgeon: Leonie Douglas, MD;  Location: [facility];  Service:  Vascular;  Laterality: Bilateral;   CATARACT EXTRACTION, BILATERAL     INGUINAL HERNIA REPAIR Left    spine cyst     TONSILLECTOMY     ULTRASOUND GUIDANCE FOR VASCULAR ACCESS Bilateral 03/29/2023   Procedure: ULTRASOUND GUIDANCE FOR VASCULAR ACCESS;  Surgeon: Leonie Douglas, MD;  Location: [facility] OR;  Service: Vascular;  Laterality: Bilateral;    Family History  Problem Relation Age of Onset   AAA (abdominal aortic aneurysm) Father    AAA (abdominal aortic aneurysm) Brother    Alzheimer's disease Brother    Panic disorder Son     Social History   Socioeconomic History   Marital status: Single    Spouse name: Not on file   Number of children: 3   Years of education: Not on file   Highest education level: Not on file  Occupational History   Not on file  Tobacco Use   Smoking status: Former    Packs/day: 1.00    Years: 45.00    Additional pack years: 0.00    Total pack years: 45.00    Types: Cigarettes    Start date: 7    Quit date: 1977    Years since quitting: 47.4   Smokeless tobacco: Former    Quit date: 11/21/1975  Vaping Use   Vaping Use: Never used  Substance and Sexual Activity   Alcohol use: Yes    Alcohol/week: 7.0 standard drinks of alcohol    Types: 7 Glasses  of wine per week    Comment: glass if wine in the pm   Drug use: No   Sexual activity: Not on file  Other Topics Concern   Not on file  Social History Narrative   Not on file   Social Determinants of Health   Financial Resource Strain: Not on file  Food Insecurity: No Food Insecurity (01/03/2023)   Hunger Vital Sign    Worried About Running Out of Food in the Last Year: Never true    Ran Out of Food in the Last Year: Never true  Transportation Needs: Not on file  Physical Activity: Not on file  Stress: Not on file  Social Connections: Not on file  Intimate Partner Violence: Not on file    Allergies  Allergen Reactions   Losartan Potassium Cough   Monopril [Fosinopril] Other (See  Comments)    Dizziness     Valium [Diazepam] Other (See Comments)    Shaking, flu like symptoms     Current Outpatient Medications  Medication Sig Dispense Refill   amLODipine (NORVASC) 5 MG tablet Take 5 mg by mouth daily.     aspirin 81 MG tablet Take 1 tablet (81 mg total) by mouth daily. May resume after 1 week on July 4 (Patient taking differently: Take 81 mg by mouth daily.) 30 tablet    Calcium Carbonate (CALCIUM 600 PO) Take 1 tablet by mouth daily.     cetirizine (ZYRTEC) 10 MG tablet Take 10 mg by mouth daily.     cyclobenzaprine (FLEXERIL) 10 MG tablet Take 1 tablet (10 mg total) by mouth 3 (three) times daily as needed for muscle spasms. 20 tablet 0   donepezil (ARICEPT) 10 MG tablet TAKE ONE TABLET BY MOUTH DAILY 30 tablet 5   escitalopram (LEXAPRO) 20 MG tablet Take 20 mg by mouth daily.     ezetimibe (ZETIA) 10 MG tablet TAKE 1 TABLET BY MOUTH DAILY 90 tablet 3   hydrochlorothiazide (HYDRODIURIL) 12.5 MG tablet Take 12.5 mg by mouth daily.     ibuprofen (ADVIL,MOTRIN) 200 MG tablet Take 200 mg by mouth every 6 (six) hours as needed for moderate pain (inflammation).     levothyroxine (SYNTHROID) 125 MCG tablet Take 125 mcg by mouth daily.     meclizine (ANTIVERT) 25 MG tablet Take 25 mg by mouth 3 (three) times daily as needed for dizziness.     Multiple Vitamin (MULTIVITAMIN) capsule Take 1 capsule by mouth daily.     psyllium (METAMUCIL) 58.6 % packet Take 1 packet by mouth daily.     QUEtiapine (SEROQUEL) 25 MG tablet Take 25 mg by mouth at bedtime.     rosuvastatin (CRESTOR) 20 MG tablet TAKE 1 TABLET BY MOUTH DAILY 30 tablet 10   Saw Palmetto 450 MG CAPS Take 450 mg by mouth daily.     traMADol (ULTRAM) 50 MG tablet Take 1 tablet (50 mg total) by mouth every 8 (eight) hours as needed. 6 tablet 0   No current facility-administered medications for this visit.    PHYSICAL EXAM Vitals:   05/01/23 1021  BP: 132/73  Pulse: 65  Resp: 20  Temp: 98.7 F (37.1 C)   Weight: 82.1 kg  Height: 6' (1.829 m)    Well-appearing elderly man in no distress Regular rate and rhythm Unlabored breathing Easily palpable pedal pulses Groins healing well.  No evidence of infection.  Easily palpable femoral pulses.  PERTINENT LABORATORY AND RADIOLOGIC DATA  Most recent CBC    Latest  Ref Rng & Units 03/30/2023    6:00 AM 03/29/2023   10:23 AM 03/29/2023    8:57 AM  CBC  WBC 4.0 - 10.5 K/uL 11.6  11.2    Hemoglobin 13.0 - 17.0 g/dL 96.0  45.4  09.8   Hematocrit 39.0 - 52.0 % 37.6  39.5  36.0   Platelets 150 - 400 K/uL 147  174       Most recent CMP    Latest Ref Rng & Units 03/30/2023    6:00 AM 03/29/2023   10:23 AM 03/29/2023    8:57 AM  CMP  Glucose 70 - 99 mg/dL 119  147    BUN 8 - 23 mg/dL 18  17    Creatinine 8.29 - 1.24 mg/dL 5.62  1.30    Sodium 865 - 145 mmol/L 137  139  141   Potassium 3.5 - 5.1 mmol/L 4.0  3.6  3.8   Chloride 98 - 111 mmol/L 103  107    CO2 22 - 32 mmol/L 25  24    Calcium 8.9 - 10.3 mg/dL 8.1  8.1      Renal function CrCl cannot be calculated (Patient's most recent lab result is older than the maximum 21 days allowed.).  No results found for: "HGBA1C"  LDL Calculated  Date Value Ref Range Status  01/28/2019 75 0 - 99 mg/dL Final    CT angiogram shows ? Type 2 endoleak; otherwise good technical result with sac regression to 51mm.   Rande Brunt. Lenell Antu, MD FACS [facility] Phone Number: [phone] 05/01/2023 10:51 AM   Total time spent on preparing this encounter including chart review, data review, collecting history, examining the patient, coordinating care for this established patient, 40 minutes.  Portions of this report may have been transcribed using voice recognition software.  Every effort has been made to ensure accuracy; however, inadvertent computerized transcription errors may still be present.

## 2023-05-01 ENCOUNTER — Encounter: Payer: Self-pay | Admitting: Neurology

## 2023-05-01 ENCOUNTER — Encounter: Payer: Self-pay | Admitting: Vascular Surgery

## 2023-05-01 ENCOUNTER — Ambulatory Visit (INDEPENDENT_AMBULATORY_CARE_PROVIDER_SITE_OTHER): Payer: Medicare Other | Admitting: Vascular Surgery

## 2023-05-01 ENCOUNTER — Ambulatory Visit: Payer: Medicare Other | Admitting: Neurology

## 2023-05-01 VITALS — BP 132/73 | HR 65 | Temp 98.7°F | Resp 20 | Ht 72.0 in | Wt 181.0 lb

## 2023-05-01 DIAGNOSIS — Z9889 Other specified postprocedural states: Secondary | ICD-10-CM

## 2023-05-01 DIAGNOSIS — Z8679 Personal history of other diseases of the circulatory system: Secondary | ICD-10-CM

## 2023-05-01 NOTE — Progress Notes (Deleted)
Patient: Brandon Valdez Date of Birth: July 25, 1934  Reason for Visit: Follow up History from: Patient Primary Neurologist: [facility]   ASSESSMENT AND PLAN 89 y.o. year old male    HISTORY OF PRESENT ILLNESS: Today 05/01/23 When last seen Aricept was started.  Memory is significantly worsened MoCA was 7/30.  HISTORY  Brief HPI: 89 year old male with a history of HLD, hypothyroidism, MDD, AAA, CAD, cardiomyopathy who follows in clinic for memory loss following COVID infection in 2020. MRI brain 09/06/21 with mild generalized atrophy.   At his last visit he was referred for neuropsychological testing.   Interval History: Feels his memory has declined since his last visit. He is having more word-finding difficulty. Loses train of thought in the middle of a sentence. He is still driving, but gets confused sometimes in unfamiliar places. Forgot he had been to this building. Still manages his medications on his own. Daughter helps him with his bills. Notes that he has become afraid to be alone at night. Becomes tearful when talking about this. He did increase his Lexapro a couple of months ago but continues to have anxiety at night. Notes that he does have vivid dreams at times. He is still living alone, but family lives nearby and checks on him almost every day. He also spends a lot of time at his girlfriend's house.   States he did see someone for memory testing but is unsure if this was neuropsychology. These records are not available for review.  REVIEW OF SYSTEMS: Out of a complete 14 system review of symptoms, the patient complains only of the following symptoms, and all other reviewed systems are negative.  See HPI  ALLERGIES: Allergies  Allergen Reactions   Losartan Potassium Cough   Monopril [Fosinopril] Other (See Comments)    Dizziness     Valium [Diazepam] Other (See Comments)    Shaking, flu like symptoms     HOME MEDICATIONS: Outpatient Medications Prior to Visit   Medication Sig Dispense Refill   amLODipine (NORVASC) 5 MG tablet Take 5 mg by mouth daily.     aspirin 81 MG tablet Take 1 tablet (81 mg total) by mouth daily. May resume after 1 week on July 4 (Patient taking differently: Take 81 mg by mouth daily.) 30 tablet    Calcium Carbonate (CALCIUM 600 PO) Take 1 tablet by mouth daily.     cetirizine (ZYRTEC) 10 MG tablet Take 10 mg by mouth daily.     cyclobenzaprine (FLEXERIL) 10 MG tablet Take 1 tablet (10 mg total) by mouth 3 (three) times daily as needed for muscle spasms. 20 tablet 0   donepezil (ARICEPT) 10 MG tablet TAKE ONE TABLET BY MOUTH DAILY 30 tablet 5   escitalopram (LEXAPRO) 20 MG tablet Take 20 mg by mouth daily.     ezetimibe (ZETIA) 10 MG tablet TAKE 1 TABLET BY MOUTH DAILY 90 tablet 3   hydrochlorothiazide (HYDRODIURIL) 12.5 MG tablet Take 12.5 mg by mouth daily.     ibuprofen (ADVIL,MOTRIN) 200 MG tablet Take 200 mg by mouth every 6 (six) hours as needed for moderate pain (inflammation).     levothyroxine (SYNTHROID) 125 MCG tablet Take 125 mcg by mouth daily.     meclizine (ANTIVERT) 25 MG tablet Take 25 mg by mouth 3 (three) times daily as needed for dizziness.     Multiple Vitamin (MULTIVITAMIN) capsule Take 1 capsule by mouth daily.     psyllium (METAMUCIL) 58.6 % packet Take 1 packet by  mouth daily.     QUEtiapine (SEROQUEL) 25 MG tablet Take 25 mg by mouth at bedtime.     rosuvastatin (CRESTOR) 20 MG tablet TAKE 1 TABLET BY MOUTH DAILY 30 tablet 10   Saw Palmetto 450 MG CAPS Take 450 mg by mouth daily.     traMADol (ULTRAM) 50 MG tablet Take 1 tablet (50 mg total) by mouth every 8 (eight) hours as needed. 6 tablet 0   No facility-administered medications prior to visit.    PAST MEDICAL HISTORY: Past Medical History:  Diagnosis Date   AAA (abdominal aortic aneurysm) (HCC)    Arthritis    History of kidney stones    Hyperlipidemia    Hypertension    Hypothyroid    Nephrolithiasis    PUD (peptic ulcer disease)      PAST SURGICAL HISTORY: Past Surgical History:  Procedure Laterality Date   ABDOMINAL AORTIC ENDOVASCULAR STENT GRAFT Bilateral 03/29/2023   Procedure: ABDOMINAL AORTIC ENDOVASCULAR STENT GRAFT;  Surgeon: Leonie Douglas, MD;  Location: [facility] OR;  Service: Vascular;  Laterality: Bilateral;   CATARACT EXTRACTION, BILATERAL     INGUINAL HERNIA REPAIR Left    spine cyst     TONSILLECTOMY     ULTRASOUND GUIDANCE FOR VASCULAR ACCESS Bilateral 03/29/2023   Procedure: ULTRASOUND GUIDANCE FOR VASCULAR ACCESS;  Surgeon: Leonie Douglas, MD;  Location: [facility];  Service: Vascular;  Laterality: Bilateral;    FAMILY HISTORY: Family History  Problem Relation Age of Onset   AAA (abdominal aortic aneurysm) Father    AAA (abdominal aortic aneurysm) Brother    Alzheimer's disease Brother    Panic disorder Son     SOCIAL HISTORY: Social History   Socioeconomic History   Marital status: Single    Spouse name: Not on file   Number of children: 3   Years of education: Not on file   Highest education level: Not on file  Occupational History   Not on file  Tobacco Use   Smoking status: Former    Packs/day: 1.00    Years: 45.00    Additional pack years: 0.00    Total pack years: 45.00    Types: Cigarettes    Start date: 57    Quit date: 1977    Years since quitting: 47.4   Smokeless tobacco: Former    Quit date: 11/21/1975  Vaping Use   Vaping Use: Never used  Substance and Sexual Activity   Alcohol use: Yes    Alcohol/week: 7.0 standard drinks of alcohol    Types: 7 Glasses of wine per week    Comment: glass if wine in the pm   Drug use: No   Sexual activity: Not on file  Other Topics Concern   Not on file  Social History Narrative   Not on file   Social Determinants of Health   Financial Resource Strain: Not on file  Food Insecurity: No Food Insecurity (01/03/2023)   Hunger Vital Sign    Worried About Running Out of Food in the Last Year: Never true    Ran Out of Food in the  Last Year: Never true  Transportation Needs: Not on file  Physical Activity: Not on file  Stress: Not on file  Social Connections: Not on file  Intimate Partner Violence: Not on file    PHYSICAL EXAM  There were no vitals filed for this visit. There is no height or weight on file to calculate BMI.  Generalized: Well developed, in no acute distress  Neurological examination  Mentation: Alert oriented to time, place, history taking. Follows all commands speech and language fluent Cranial nerve II-XII: Pupils were equal round reactive to light. Extraocular movements were full, visual field were full on confrontational test. Facial sensation and strength were normal. Uvula tongue midline. Head turning and shoulder shrug  were normal and symmetric. Motor: The motor testing reveals 5 over 5 strength of all 4 extremities. Good symmetric motor tone is noted throughout.  Sensory: Sensory testing is intact to soft touch on all 4 extremities. No evidence of extinction is noted.  Coordination: Cerebellar testing reveals good finger-nose-finger and heel-to-shin bilaterally.  Gait and station: Gait is normal. Tandem gait is normal. Romberg is negative. No drift is seen.  Reflexes: Deep tendon reflexes are symmetric and normal bilaterally.   DIAGNOSTIC DATA (LABS, IMAGING, TESTING) - I reviewed patient records, labs, notes, testing and imaging myself where available.  Lab Results  Component Value Date   WBC 11.6 (H) 03/30/2023   HGB 12.5 (L) 03/30/2023   HCT 37.6 (L) 03/30/2023   MCV 93.1 03/30/2023   PLT 147 (L) 03/30/2023      Component Value Date/Time   NA 137 03/30/2023 0600   NA 139 01/29/2020 1016   K 4.0 03/30/2023 0600   CL 103 03/30/2023 0600   CO2 25 03/30/2023 0600   GLUCOSE 108 (H) 03/30/2023 0600   BUN 18 03/30/2023 0600   BUN 11 01/29/2020 1016   CREATININE 0.85 03/30/2023 0600   CALCIUM 8.1 (L) 03/30/2023 0600   PROT 6.4 (L) 03/22/2023 1100   PROT 6.4 01/28/2019 1549    ALBUMIN 3.6 03/22/2023 1100   ALBUMIN 4.2 01/28/2019 1549   AST 24 03/22/2023 1100   ALT 18 03/22/2023 1100   ALKPHOS 54 03/22/2023 1100   BILITOT 0.8 03/22/2023 1100   BILITOT 0.4 01/28/2019 1549   GFRNONAA >60 03/30/2023 0600   GFRAA 92 01/29/2020 1016   Lab Results  Component Value Date   CHOL 138 01/28/2019   HDL 38 (L) 01/28/2019   LDLCALC 75 01/28/2019   TRIG 124 01/28/2019   CHOLHDL 3.6 01/28/2019   No results found for: "HGBA1C" Lab Results  Component Value Date   VITAMINB12 650 08/22/2021   No results found for: "TSH"  Margie Ege, AGNP-C, DNP 05/01/2023, 5:37 AM [facility] [address] [place], [place] [address] [phone]

## 2023-05-08 ENCOUNTER — Other Ambulatory Visit (HOSPITAL_COMMUNITY): Payer: Medicare Other

## 2023-05-08 ENCOUNTER — Ambulatory Visit: Payer: Medicare Other

## 2023-05-09 DIAGNOSIS — M25561 Pain in right knee: Secondary | ICD-10-CM | POA: Diagnosis not present

## 2023-05-10 ENCOUNTER — Telehealth: Payer: Self-pay | Admitting: *Deleted

## 2023-05-10 NOTE — Telephone Encounter (Signed)
   Pre-operative Risk Assessment    Patient Name: Brandon Valdez  DOB: 1934/03/21 MRN: [id number]      Request for Surgical Clearance    Procedure:   RIGHT TKA  Date of Surgery:  Clearance TBD                                 Surgeon:  DR. Malon Kindle Surgeon's Group or Practice Name:  [facility] Phone number:  [phone] ATTN: MEGAN DAVIS Fax number:  [phone]   Type of Clearance Requested:   - Medical ; ASA    Type of Anesthesia:   CHOICE   Additional requests/questions:    Elpidio Anis   05/10/2023, 6:04 PM

## 2023-05-10 NOTE — Telephone Encounter (Signed)
Faxed completed/signed form below to [phone]. Received fax confirmation. D.r Lucia Gaskins signed as covering MD since Dr. Delena Bali out on leave currently.

## 2023-05-11 NOTE — Telephone Encounter (Signed)
S/w DPR pt's son Thayer Ohm about tele pre op appt. Thayer Ohm tells me that the pt just had a steroid injection in the knee he needs surgery on and is going to need to wait 3 months. We have decided the best plan then is to keep the appt with Dr. Jens Som to discuss further about the clearance.   I will update all parties involved.

## 2023-05-11 NOTE — Telephone Encounter (Signed)
   Name: Brandon Valdez  DOB: October 14, 1934  MRN: [id number]  Primary Cardiologist: Olga Millers, MD   Preoperative team, please contact this patient and set up a phone call appointment for further preoperative risk assessment. Please obtain consent and complete medication review. Thank you for your help.  I confirm that guidance regarding antiplatelet and oral anticoagulation therapy has been completed and, if necessary, noted below.  Patient's aspirin is not prescribed by cardiology.  Recommendations for holding aspirin will need to come from prescribing provider  Ronney Asters, NP 05/11/2023, 9:57 AM [facility]

## 2023-05-18 DIAGNOSIS — Z9889 Other specified postprocedural states: Secondary | ICD-10-CM | POA: Diagnosis not present

## 2023-05-18 DIAGNOSIS — E039 Hypothyroidism, unspecified: Secondary | ICD-10-CM | POA: Diagnosis not present

## 2023-05-18 DIAGNOSIS — G479 Sleep disorder, unspecified: Secondary | ICD-10-CM | POA: Diagnosis not present

## 2023-05-18 DIAGNOSIS — E785 Hyperlipidemia, unspecified: Secondary | ICD-10-CM | POA: Diagnosis not present

## 2023-05-18 DIAGNOSIS — Z01818 Encounter for other preprocedural examination: Secondary | ICD-10-CM | POA: Diagnosis not present

## 2023-05-18 DIAGNOSIS — G309 Alzheimer's disease, unspecified: Secondary | ICD-10-CM | POA: Diagnosis not present

## 2023-05-25 ENCOUNTER — Other Ambulatory Visit: Payer: Self-pay

## 2023-05-25 MED ORDER — ROSUVASTATIN CALCIUM 20 MG PO TABS: 20.0000 mg | ORAL_TABLET | Freq: Every day | ORAL | 2 refills | Status: DC

## 2023-06-07 DIAGNOSIS — E039 Hypothyroidism, unspecified: Secondary | ICD-10-CM | POA: Diagnosis not present

## 2023-06-07 DIAGNOSIS — G479 Sleep disorder, unspecified: Secondary | ICD-10-CM | POA: Diagnosis not present

## 2023-06-07 DIAGNOSIS — I10 Essential (primary) hypertension: Secondary | ICD-10-CM | POA: Diagnosis not present

## 2023-06-07 DIAGNOSIS — F0280 Dementia in other diseases classified elsewhere without behavioral disturbance: Secondary | ICD-10-CM | POA: Diagnosis not present

## 2023-06-27 NOTE — Progress Notes (Signed)
HPI: FU AAA. Nuclear study October 2015 showed ejection fraction 46% with left bundle branch block artifact and no ischemia. Echocardiogram January 2020 showed ejection fraction 40 to 45%, grade 1 diastolic dysfunction. Chest CT September 2020 showed aortic atherosclerosis and three-vessel coronary disease. Echocardiogram March 2023 showed ejection fraction 50 to 55%, mild to moderate left atrial enlargement, mild right atrial enlargement.  Patient had abdominal aortic aneurysm stent graft May 2024.  Follow-up CTA June 2024 showed endovascular repair of abdominal aortic aneurysm with type II endoleak.  Since last seen he denies dyspnea, chest pain, palpitations or syncope.  He is scheduled to have right knee replacement as well.  Current Outpatient Medications  Medication Sig Dispense Refill   amLODipine (NORVASC) 5 MG tablet Take 5 mg by mouth daily.     aspirin 81 MG tablet Take 1 tablet (81 mg total) by mouth daily. May resume after 1 week on July 4 (Patient taking differently: Take 81 mg by mouth daily.) 30 tablet    Calcium Carbonate (CALCIUM 600 PO) Take 1 tablet by mouth daily.     cetirizine (ZYRTEC) 10 MG tablet Take 10 mg by mouth daily.     cyclobenzaprine (FLEXERIL) 10 MG tablet Take 1 tablet (10 mg total) by mouth 3 (three) times daily as needed for muscle spasms. 20 tablet 0   donepezil (ARICEPT) 10 MG tablet TAKE ONE TABLET BY MOUTH DAILY 30 tablet 5   escitalopram (LEXAPRO) 20 MG tablet Take 20 mg by mouth daily.     ezetimibe (ZETIA) 10 MG tablet TAKE 1 TABLET BY MOUTH DAILY 90 tablet 3   hydrochlorothiazide (HYDRODIURIL) 12.5 MG tablet Take 12.5 mg by mouth daily.     ibuprofen (ADVIL,MOTRIN) 200 MG tablet Take 200 mg by mouth every 6 (six) hours as needed for moderate pain (inflammation).     levothyroxine (SYNTHROID) 125 MCG tablet Take 125 mcg by mouth daily.     meclizine (ANTIVERT) 25 MG tablet Take 25 mg by mouth 3 (three) times daily as needed for dizziness.      Multiple Vitamin (MULTIVITAMIN) capsule Take 1 capsule by mouth daily.     psyllium (METAMUCIL) 58.6 % packet Take 1 packet by mouth daily.     QUEtiapine (SEROQUEL) 25 MG tablet Take 25 mg by mouth at bedtime.     rosuvastatin (CRESTOR) 20 MG tablet Take 1 tablet (20 mg total) by mouth daily. 90 tablet 2   Saw Palmetto 450 MG CAPS Take 450 mg by mouth daily.     traMADol (ULTRAM) 50 MG tablet Take 1 tablet (50 mg total) by mouth every 8 (eight) hours as needed. 6 tablet 0   No current facility-administered medications for this visit.     Past Medical History:  Diagnosis Date   AAA (abdominal aortic aneurysm) (HCC)    Arthritis    History of kidney stones    Hyperlipidemia    Hypertension    Hypothyroid    Nephrolithiasis    PUD (peptic ulcer disease)     Past Surgical History:  Procedure Laterality Date   ABDOMINAL AORTIC ENDOVASCULAR STENT GRAFT Bilateral 03/29/2023   Procedure: ABDOMINAL AORTIC ENDOVASCULAR STENT GRAFT;  Surgeon: Leonie Douglas, MD;  Location: [facility] OR;  Service: Vascular;  Laterality: Bilateral;   CATARACT EXTRACTION, BILATERAL     INGUINAL HERNIA REPAIR Left    spine cyst     TONSILLECTOMY     ULTRASOUND GUIDANCE FOR VASCULAR ACCESS Bilateral 03/29/2023   Procedure: ULTRASOUND  GUIDANCE FOR VASCULAR ACCESS;  Surgeon: Leonie Douglas, MD;  Location: [facility] OR;  Service: Vascular;  Laterality: Bilateral;    Social History   Socioeconomic History   Marital status: Single    Spouse name: Not on file   Number of children: 3   Years of education: Not on file   Highest education level: Not on file  Occupational History   Not on file  Tobacco Use   Smoking status: Former    Current packs/day: 0.00    Average packs/day: 1 pack/day for 45.0 years (45.0 ttl pk-yrs)    Types: Cigarettes    Start date: 41    Quit date: 32    Years since quitting: 47.6   Smokeless tobacco: Former    Quit date: 11/21/1975  Vaping Use   Vaping status: Never Used  Substance and  Sexual Activity   Alcohol use: Yes    Alcohol/week: 7.0 standard drinks of alcohol    Types: 7 Glasses of wine per week    Comment: glass if wine in the pm   Drug use: No   Sexual activity: Not on file  Other Topics Concern   Not on file  Social History Narrative   Not on file   Social Determinants of Health   Financial Resource Strain: Not on file  Food Insecurity: No Food Insecurity (01/03/2023)   Hunger Vital Sign    Worried About Running Out of Food in the Last Year: Never true    Ran Out of Food in the Last Year: Never true  Transportation Needs: Not on file  Physical Activity: Not on file  Stress: Not on file  Social Connections: Not on file  Intimate Partner Violence: Not on file    Family History  Problem Relation Age of Onset   AAA (abdominal aortic aneurysm) Father    AAA (abdominal aortic aneurysm) Brother    Alzheimer's disease Brother    Panic disorder Son     ROS: Knee pain but no fevers or chills, productive cough, hemoptysis, dysphasia, odynophagia, melena, hematochezia, dysuria, hematuria, rash, seizure activity, orthopnea, PND, pedal edema, claudication. Remaining systems are negative.  Physical Exam: Well-developed well-nourished in no acute distress.  Skin is warm and dry.  HEENT is normal.  Neck is supple.  Chest is clear to auscultation with normal expansion.  Cardiovascular exam is regular rate and rhythm.  Abdominal exam nontender or distended. No masses palpated. Extremities show no edema. neuro grossly intact  EKG Interpretation Date/Time:  Tuesday July 03 2023 09:26:44 EDT Ventricular Rate:  74 PR Interval:  170 QRS Duration:  158 QT Interval:  450 QTC Calculation: 499 R Axis:   10  Text Interpretation: Sinus rhythm with occasional Premature ventricular complexes Left bundle branch block When compared with ECG of 17-Jun-2001 10:19, Premature ventricular complexes are now Present Left bundle branch block is now Present Confirmed by  Olga Millers ([id number]) on 07/03/2023 9:28:23 AM    A/P  1 coronary calcification-patient denies chest pain.  Continue aspirin and statin.  2 hypertension-blood pressure controlled today.  Continue present medical regimen.  Check potassium and renal function.  3 hyperlipidemia-continue statin.  Check lipids and liver.  4 abdominal aortic aneurysm status post endovascular repair-follow-up vascular surgery.  5 history of cardiomyopathy-most recent echocardiogram showed low normal LV function.    6 preoperative evaluation prior to knee replacement-patient states he can walk up to 1 mile without having chest pain or dyspnea.  He could also climb 2 flights of  stairs.  Given good functional capacity without symptoms he may proceed without further cardiac testing.  Olga Millers, MD

## 2023-07-03 ENCOUNTER — Encounter: Payer: Self-pay | Admitting: Cardiology

## 2023-07-03 ENCOUNTER — Ambulatory Visit: Payer: Medicare Other | Attending: Cardiology | Admitting: Cardiology

## 2023-07-03 VITALS — BP 136/74 | HR 74 | Ht 77.0 in | Wt 188.2 lb

## 2023-07-03 DIAGNOSIS — E7800 Pure hypercholesterolemia, unspecified: Secondary | ICD-10-CM | POA: Diagnosis not present

## 2023-07-03 DIAGNOSIS — Z01810 Encounter for preprocedural cardiovascular examination: Secondary | ICD-10-CM

## 2023-07-03 DIAGNOSIS — I714 Abdominal aortic aneurysm, without rupture, unspecified: Secondary | ICD-10-CM

## 2023-07-03 DIAGNOSIS — I739 Peripheral vascular disease, unspecified: Secondary | ICD-10-CM | POA: Diagnosis not present

## 2023-07-03 DIAGNOSIS — I10 Essential (primary) hypertension: Secondary | ICD-10-CM | POA: Diagnosis not present

## 2023-07-03 DIAGNOSIS — I429 Cardiomyopathy, unspecified: Secondary | ICD-10-CM

## 2023-07-03 LAB — COMPREHENSIVE METABOLIC PANEL
ALT: 13 IU/L (ref 0–44)
AST: 21 IU/L (ref 0–40)
Albumin: 4 g/dL (ref 3.7–4.7)
Alkaline Phosphatase: 65 IU/L (ref 44–121)
BUN/Creatinine Ratio: 15 (ref 10–24)
BUN: 12 mg/dL (ref 8–27)
Bilirubin Total: 0.5 mg/dL (ref 0.0–1.2)
CO2: 29 mmol/L (ref 20–29)
Calcium: 9.7 mg/dL (ref 8.6–10.2)
Chloride: 102 mmol/L (ref 96–106)
Creatinine, Ser: 0.81 mg/dL (ref 0.76–1.27)
Globulin, Total: 2.1 g/dL (ref 1.5–4.5)
Glucose: 85 mg/dL (ref 70–99)
Sodium: 140 mmol/L (ref 134–144)
Total Protein: 6.1 g/dL (ref 6.0–8.5)
eGFR: 85 mL/min/{1.73_m2} (ref >59)

## 2023-07-03 LAB — LIPID PANEL
Chol/HDL Ratio: 3 ratio (ref 0.0–5.0)
Cholesterol, Total: 126 mg/dL (ref 100–199)
HDL: 42 mg/dL (ref >39)
LDL Chol Calc (NIH): 68 mg/dL (ref 0–99)
Triglycerides: 82 mg/dL (ref 0–149)
VLDL Cholesterol Cal: 16 mg/dL (ref 5–40)

## 2023-07-03 NOTE — Patient Instructions (Signed)

## 2023-07-05 ENCOUNTER — Encounter: Payer: Self-pay | Admitting: *Deleted

## 2023-07-17 DIAGNOSIS — M13861 Other specified arthritis, right knee: Secondary | ICD-10-CM | POA: Diagnosis not present

## 2023-07-30 DIAGNOSIS — E039 Hypothyroidism, unspecified: Secondary | ICD-10-CM | POA: Diagnosis not present

## 2023-07-31 DIAGNOSIS — Z23 Encounter for immunization: Secondary | ICD-10-CM | POA: Diagnosis not present

## 2023-07-31 LAB — LAB REPORT - SCANNED: EGFR: 83

## 2023-08-07 IMAGING — CT CT CHEST W/O CM
1 of 2 series · 15 of 32 positions shown, 19 images · non-contrast
Comparison: 08/14/2019

CLINICAL DATA: Follow-up pulmonary nodule



[Series 4: super d · axial · 0.82mm/px · z∈[-389,-57]mm · 15 of 465 slices shown, 19 images]
[im 25/465  mediastinal]
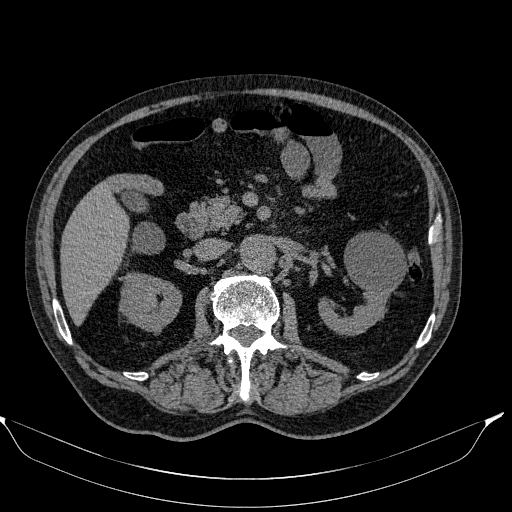
[im 25/465  lung]
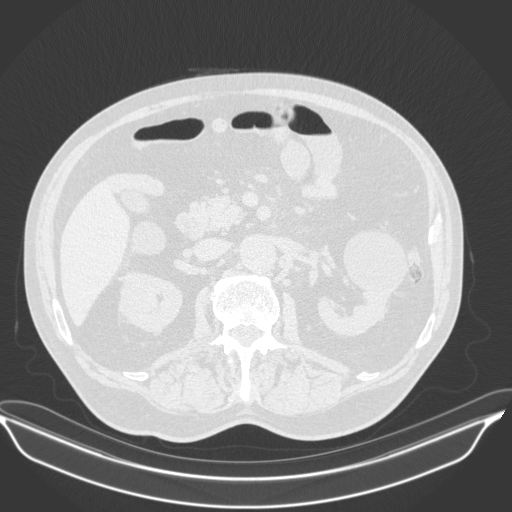
[im 74/465  lung]
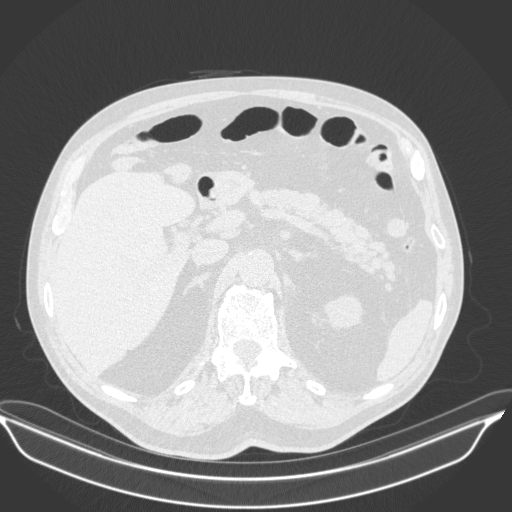
[im 98/465  lung]
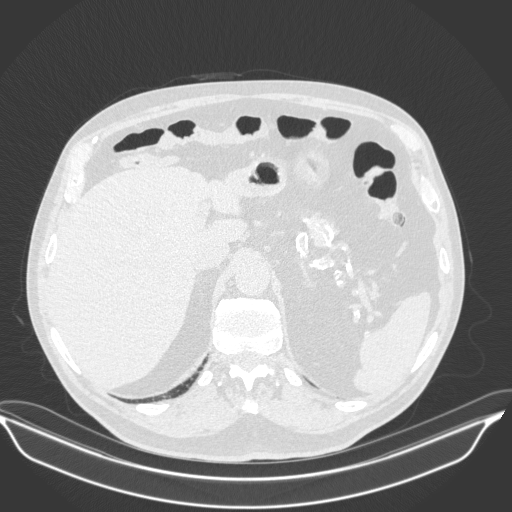
[im 123/465  lung]
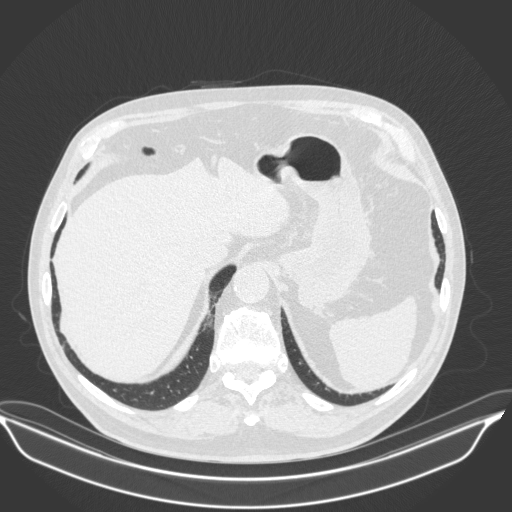
[im 155/465  mediastinal]
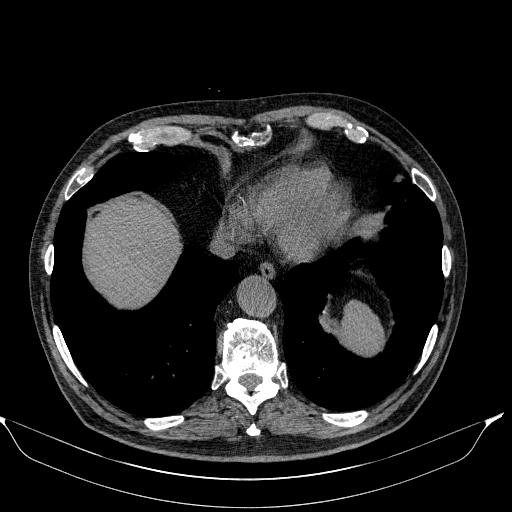
[im 155/465  lung]
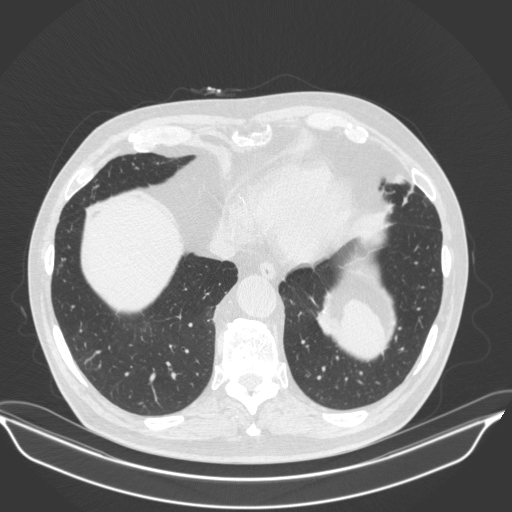
[im 171/465  lung]
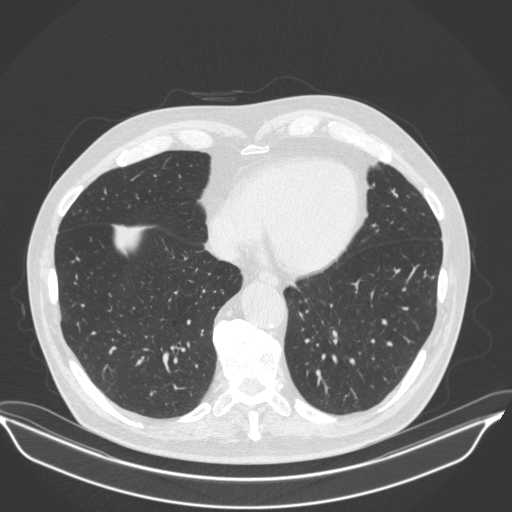
[im 220/465  lung]
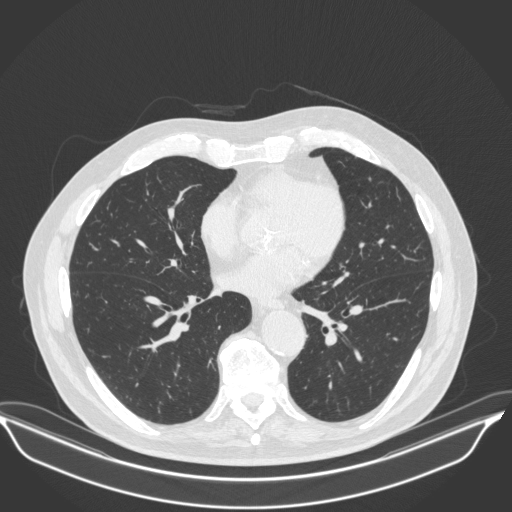
[im 221/465  lung]
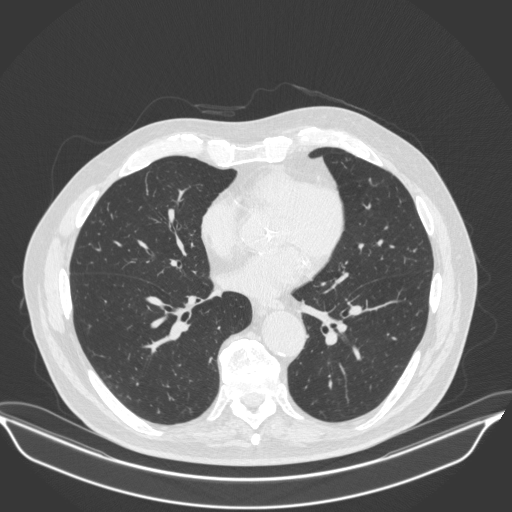
[im 245/465  mediastinal]
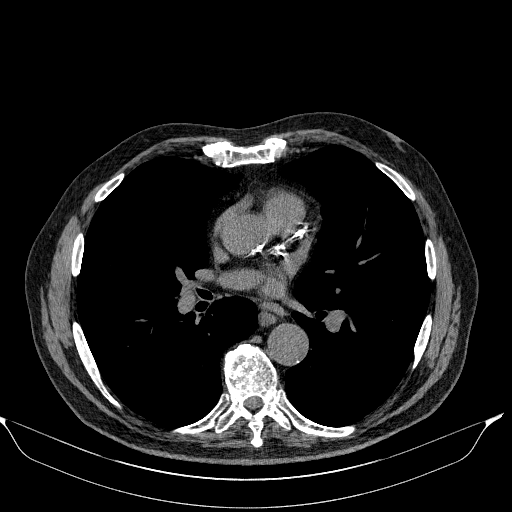
[im 245/465  lung]
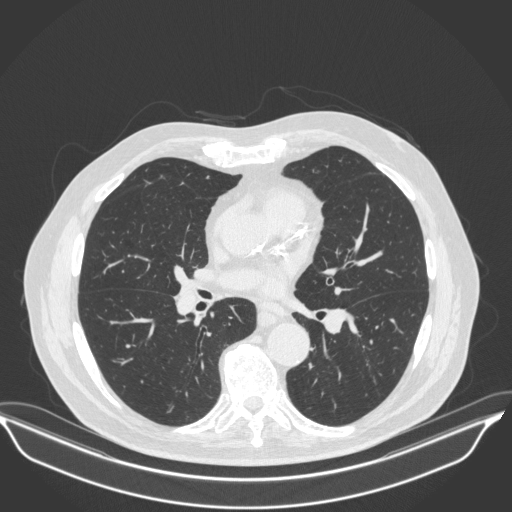
[im 294/465  lung]
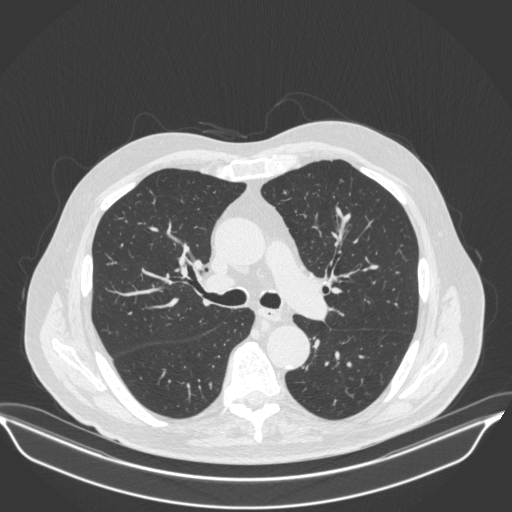
[im 310/465  lung]
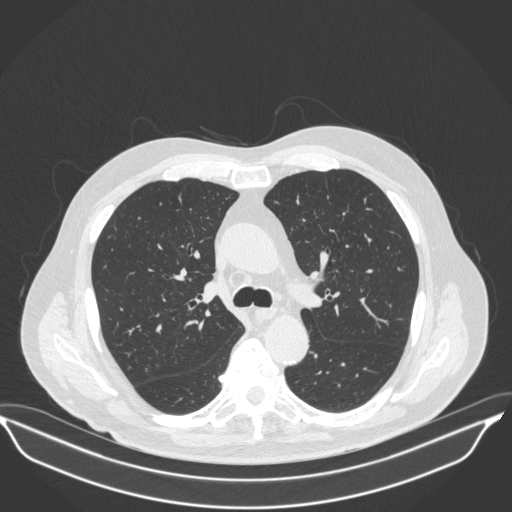
[im 342/465  lung]
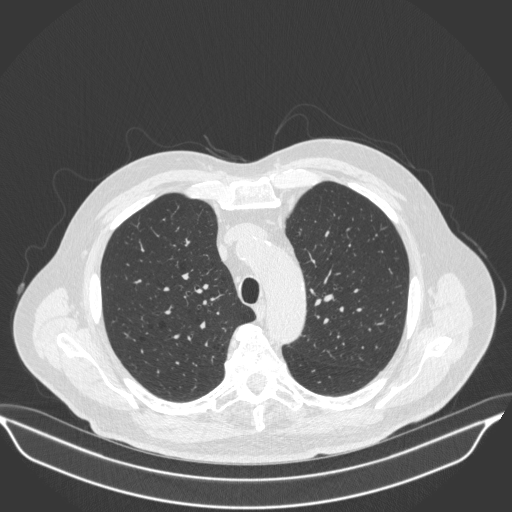
[im 367/465  mediastinal]
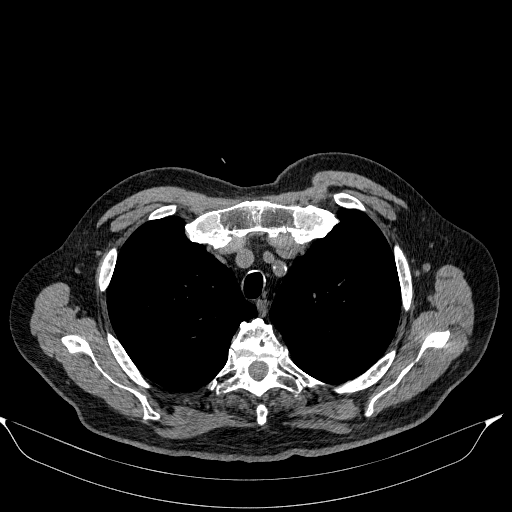
[im 367/465  lung]
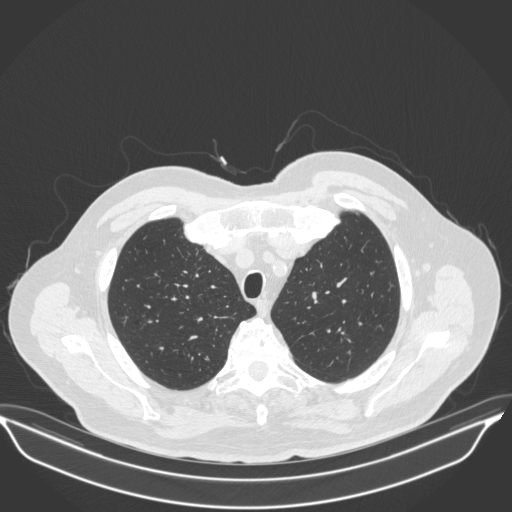
[im 391/465  lung]
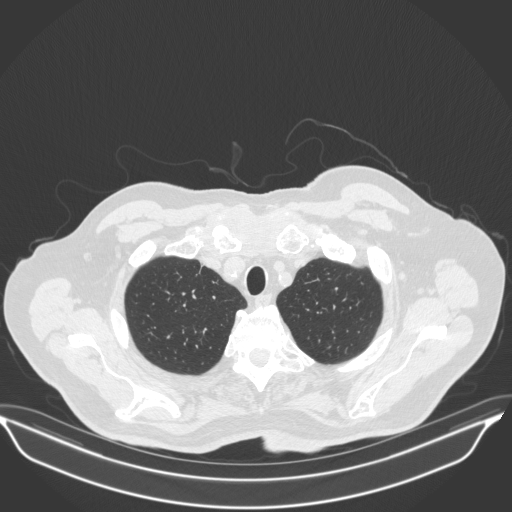
[im 440/465  lung]
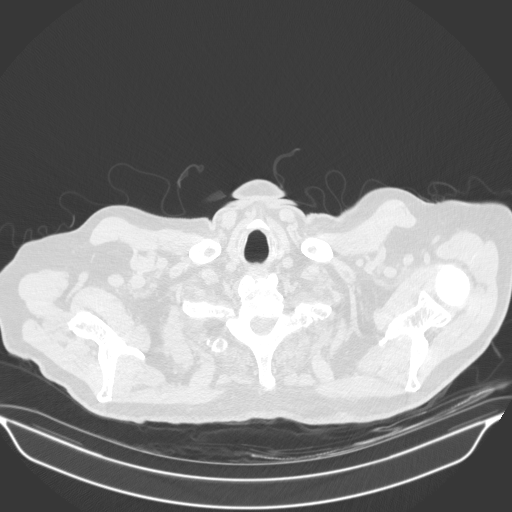

[15 of 32 positions shown; findings below may reference images not displayed]

FINDINGS: Cardiovascular: Somewhat limited due to lack of IV contrast. Aortic
calcifications are noted without aneurysmal dilatation. No cardiac
enlargement is seen. Heavy coronary calcifications are noted. No
pericardial effusion is seen.

Mediastinum/Nodes: Thoracic inlet is within normal limits. No
sizable hilar or mediastinal adenopathy is noted. The esophagus as
visualized is within normal limits.

Lungs/Pleura: Lungs are well aerated bilaterally. No focal
infiltrate or sizable effusion is seen. Stable small pulmonary
nodules measuring less than 5 mm are noted similar to that seen on
the prior exam and again favored to be benign in etiology. The
previously seen cluster of micro nodules in the lateral aspect of
the right lower lobe has improved in the interval from the prior
exam and was likely postinflammatory in nature. Peri fissural lymph
node is noted best seen on image number 108 of series 3. No new
sizable parenchymal nodules are noted.

Upper Abdomen: Renal cystic changes are noted bilaterally. Remainder
of the upper abdomen appears within normal limits.

Musculoskeletal: Degenerative changes of the thoracic spine are
noted. No acute rib abnormality is noted.
IMPRESSION: Multiple small less than 4 mm nodules are again identified
bilaterally stable from the prior exam. No new parenchymal nodules
are seen. No further follow-up is necessary.

Near complete resolution of previously seen micro nodules in the
lateral aspect of the right lower lobe. These are also consistent
with benign etiology.

Renal cystic changes bilaterally.

Aortic Atherosclerosis ([id number]).

## 2023-08-09 NOTE — H&P (Signed)
Patient's anticipated LOS is less than 2 midnights, meeting these requirements: - Younger than 47 - Lives within 1 hour of care - Has a competent adult at home to recover with post-op recover - NO history of  - Chronic pain requiring opiods  - Diabetes  - Coronary Artery Disease  - Heart failure  - Heart attack  - Stroke  - DVT/VTE  - Cardiac arrhythmia  - Respiratory Failure/COPD  - Renal failure  - Anemia  - Advanced Liver disease     Brandon Valdez is an 89 y.o. male.    Chief Complaint: right knee pain  HPI: Pt is a 89 y.o. male complaining of right knee pain for multiple years. Pain had continually increased since the beginning. X-rays in the clinic show end-stage arthritic changes of the right knee. Pt has tried various conservative treatments which have failed to alleviate their symptoms, including injections and therapy. Various options are discussed with the patient. Risks, benefits and expectations were discussed with the patient. Patient understand the risks, benefits and expectations and wishes to proceed with surgery.   PCP:  Fatima Sanger, FNP  D/C Plans: Home  PMH: Past Medical History:  Diagnosis Date   AAA (abdominal aortic aneurysm) (HCC)    Arthritis    History of kidney stones    Hyperlipidemia    Hypertension    Hypothyroid    Nephrolithiasis    PUD (peptic ulcer disease)     PSH: Past Surgical History:  Procedure Laterality Date   ABDOMINAL AORTIC ENDOVASCULAR STENT GRAFT Bilateral 03/29/2023   Procedure: ABDOMINAL AORTIC ENDOVASCULAR STENT GRAFT;  Surgeon: Leonie Douglas, MD;  Location: [facility] OR;  Service: Vascular;  Laterality: Bilateral;   CATARACT EXTRACTION, BILATERAL     INGUINAL HERNIA REPAIR Left    spine cyst     TONSILLECTOMY     ULTRASOUND GUIDANCE FOR VASCULAR ACCESS Bilateral 03/29/2023   Procedure: ULTRASOUND GUIDANCE FOR VASCULAR ACCESS;  Surgeon: Leonie Douglas, MD;  Location: [facility] OR;  Service: Vascular;  Laterality:  Bilateral;    Social History:  reports that he quit smoking about 47 years ago. His smoking use included cigarettes. He started smoking about 72 years ago. He has a 45 pack-year smoking history. He quit smokeless tobacco use about 47 years ago. He reports current alcohol use of about 7.0 standard drinks of alcohol per week. He reports that he does not use drugs. BMI: Estimated body mass index is 22.32 kg/m as calculated from the following:   Height as of 07/03/23: 6\' 5"  (1.956 m).   Weight as of 07/03/23: 85.4 kg.  Lab Results  Component Value Date   ALBUMIN 4.0 07/03/2023   Diabetes: Patient does not have a diagnosis of diabetes.     Smoking Status:      Allergies:  Allergies  Allergen Reactions   Losartan Potassium Cough   Monopril [Fosinopril] Other (See Comments)    Dizziness     Valium [Diazepam] Other (See Comments)    Shaking, flu like symptoms     Medications: No current facility-administered medications for this encounter.   Current Outpatient Medications  Medication Sig Dispense Refill   amLODipine (NORVASC) 5 MG tablet Take 5 mg by mouth daily.     aspirin 81 MG tablet Take 1 tablet (81 mg total) by mouth daily. May resume after 1 week on July 4 (Patient taking differently: Take 81 mg by mouth daily.) 30 tablet    cetirizine (ZYRTEC) 10 MG tablet Take  10 mg by mouth daily as needed for allergies.     escitalopram (LEXAPRO) 20 MG tablet Take 20 mg by mouth daily.     ezetimibe (ZETIA) 10 MG tablet TAKE 1 TABLET BY MOUTH DAILY 90 tablet 3   ibuprofen (ADVIL,MOTRIN) 200 MG tablet Take 400 mg by mouth every 6 (six) hours as needed for moderate pain (inflammation).     levothyroxine (SYNTHROID) 137 MCG tablet Take 137 mcg by mouth daily before breakfast.     meclizine (ANTIVERT) 25 MG tablet Take 25 mg by mouth 3 (three) times daily as needed for dizziness.     Multiple Vitamin (MULTIVITAMIN) capsule Take 1 capsule by mouth daily.     psyllium (METAMUCIL) 58.6 %  packet Take 1 packet by mouth daily.     QUEtiapine (SEROQUEL) 100 MG tablet Take 100 mg by mouth at bedtime.     rosuvastatin (CRESTOR) 20 MG tablet Take 1 tablet (20 mg total) by mouth daily. 90 tablet 2    No results found for this or any previous visit (from the past 48 hour(s)). No results found.  ROS: Pain with rom of the right lower extremity  Physical Exam: Alert and oriented 89 y.o. male in no acute distress Cranial nerves 2-12 intact Cervical spine: full rom with no tenderness, nv intact distally Chest: active breath sounds bilaterally, no wheeze rhonchi or rales Heart: regular rate and rhythm, no murmur Abd: non tender non distended with active bowel sounds Hip is stable with rom  Right knee painful rom  Medial and lateral joint line tenderness Antalgic gait  Assessment/Plan Assessment: right knee end stage osteoarthritis  Plan:  Patient will undergo a right total knee by Dr. Ranell Patrick at [place] Risks benefits and expectations were discussed with the patient. Patient understand risks, benefits and expectations and wishes to proceed. Preoperative templating of the joint replacement has been completed, documented, and submitted to the Operating Room personnel in order to optimize intra-operative equipment management.   Alphonsa Overall PA-C, MPAS [facility] is now [employer] [address], [address], [place], [place] [address] Phone: [phone] [email or web address] Facebook  [employer]

## 2023-08-13 DIAGNOSIS — E039 Hypothyroidism, unspecified: Secondary | ICD-10-CM | POA: Diagnosis not present

## 2023-08-13 DIAGNOSIS — L570 Actinic keratosis: Secondary | ICD-10-CM | POA: Diagnosis not present

## 2023-08-13 DIAGNOSIS — Z0000 Encounter for general adult medical examination without abnormal findings: Secondary | ICD-10-CM | POA: Diagnosis not present

## 2023-08-13 DIAGNOSIS — I10 Essential (primary) hypertension: Secondary | ICD-10-CM | POA: Diagnosis not present

## 2023-08-13 DIAGNOSIS — E785 Hyperlipidemia, unspecified: Secondary | ICD-10-CM | POA: Diagnosis not present

## 2023-08-13 DIAGNOSIS — F0280 Dementia in other diseases classified elsewhere without behavioral disturbance: Secondary | ICD-10-CM | POA: Diagnosis not present

## 2023-08-13 NOTE — Progress Notes (Deleted)
Patient: Brandon Valdez Date of Birth: 01/16/1934  Reason for Visit: Follow up History from: Patient Primary Neurologist: [facility]   ASSESSMENT AND PLAN 89 y.o. year old male   1.  Dementia, likely Alzheimer's disease   HISTORY OF PRESENT ILLNESS: Today 08/13/23 Saw Dr. Delena Bali November 2023.  MoCA was 7/30.  Started Aricept.  HISTORY  10/10/22 Dr. Delena Bali: 89 year old male with a history of HLD, hypothyroidism, MDD, AAA, CAD, cardiomyopathy who follows in clinic for memory loss following COVID infection in 2020. MRI brain 09/06/21 with mild generalized atrophy.   At his last visit he was referred for neuropsychological testing.   Interval History: Feels his memory has declined since his last visit. He is having more word-finding difficulty. Loses train of thought in the middle of a sentence. He is still driving, but gets confused sometimes in unfamiliar places. Forgot he had been to this building. Still manages his medications on his own. Daughter helps him with his bills. Notes that he has become afraid to be alone at night. Becomes tearful when talking about this. He did increase his Lexapro a couple of months ago but continues to have anxiety at night. Notes that he does have vivid dreams at times. He is still living alone, but family lives nearby and checks on him almost every day. He also spends a lot of time at his girlfriend's house.   States he did see someone for memory testing but is unsure if this was neuropsychology. These records are not available for review.  REVIEW OF SYSTEMS: Out of a complete 14 system review of symptoms, the patient complains only of the following symptoms, and all other reviewed systems are negative.  See HPI  ALLERGIES: Allergies  Allergen Reactions   Losartan Potassium Cough   Monopril [Fosinopril] Other (See Comments)    Dizziness     Valium [Diazepam] Other (See Comments)    Shaking, flu like symptoms     HOME  MEDICATIONS: Outpatient Medications Prior to Visit  Medication Sig Dispense Refill   amLODipine (NORVASC) 5 MG tablet Take 5 mg by mouth daily.     aspirin 81 MG tablet Take 1 tablet (81 mg total) by mouth daily. May resume after 1 week on July 4 (Patient taking differently: Take 81 mg by mouth daily.) 30 tablet    cetirizine (ZYRTEC) 10 MG tablet Take 10 mg by mouth daily as needed for allergies.     escitalopram (LEXAPRO) 20 MG tablet Take 20 mg by mouth daily.     ezetimibe (ZETIA) 10 MG tablet TAKE 1 TABLET BY MOUTH DAILY 90 tablet 3   ibuprofen (ADVIL,MOTRIN) 200 MG tablet Take 400 mg by mouth every 6 (six) hours as needed for moderate pain (inflammation).     meclizine (ANTIVERT) 25 MG tablet Take 25 mg by mouth 3 (three) times daily as needed for dizziness.     Multiple Vitamin (MULTIVITAMIN) capsule Take 1 capsule by mouth daily.     psyllium (METAMUCIL) 58.6 % packet Take 1 packet by mouth daily.     rosuvastatin (CRESTOR) 20 MG tablet Take 1 tablet (20 mg total) by mouth daily. 90 tablet 2   No facility-administered medications prior to visit.    PAST MEDICAL HISTORY: Past Medical History:  Diagnosis Date   AAA (abdominal aortic aneurysm) (HCC)    Arthritis    History of kidney stones    Hyperlipidemia    Hypertension    Hypothyroid    Nephrolithiasis  PUD (peptic ulcer disease)     PAST SURGICAL HISTORY: Past Surgical History:  Procedure Laterality Date   ABDOMINAL AORTIC ENDOVASCULAR STENT GRAFT Bilateral 03/29/2023   Procedure: ABDOMINAL AORTIC ENDOVASCULAR STENT GRAFT;  Surgeon: Leonie Douglas, MD;  Location: [facility] OR;  Service: Vascular;  Laterality: Bilateral;   CATARACT EXTRACTION, BILATERAL     INGUINAL HERNIA REPAIR Left    spine cyst     TONSILLECTOMY     ULTRASOUND GUIDANCE FOR VASCULAR ACCESS Bilateral 03/29/2023   Procedure: ULTRASOUND GUIDANCE FOR VASCULAR ACCESS;  Surgeon: Leonie Douglas, MD;  Location: [facility] OR;  Service: Vascular;  Laterality: Bilateral;     FAMILY HISTORY: Family History  Problem Relation Age of Onset   AAA (abdominal aortic aneurysm) Father    AAA (abdominal aortic aneurysm) Brother    Alzheimer's disease Brother    Panic disorder Son     SOCIAL HISTORY: Social History   Socioeconomic History   Marital status: Single    Spouse name: Not on file   Number of children: 3   Years of education: Not on file   Highest education level: Not on file  Occupational History   Not on file  Tobacco Use   Smoking status: Former    Current packs/day: 0.00    Average packs/day: 1 pack/day for 45.0 years (45.0 ttl pk-yrs)    Types: Cigarettes    Start date: 80    Quit date: 63    Years since quitting: 47.7   Smokeless tobacco: Former    Quit date: 11/21/1975  Vaping Use   Vaping status: Never Used  Substance and Sexual Activity   Alcohol use: Yes    Alcohol/week: 7.0 standard drinks of alcohol    Types: 7 Glasses of wine per week    Comment: glass if wine in the pm   Drug use: No   Sexual activity: Not on file  Other Topics Concern   Not on file  Social History Narrative   Not on file   Social Determinants of Health   Financial Resource Strain: Not on file  Food Insecurity: No Food Insecurity (01/03/2023)   Hunger Vital Sign    Worried About Running Out of Food in the Last Year: Never true    Ran Out of Food in the Last Year: Never true  Transportation Needs: Not on file  Physical Activity: Not on file  Stress: Not on file  Social Connections: Not on file  Intimate Partner Violence: Not on file    PHYSICAL EXAM  There were no vitals filed for this visit. There is no height or weight on file to calculate BMI.  Generalized: Well developed, in no acute distress  Neurological examination  Mentation: Alert oriented to time, place, history taking. Follows all commands speech and language fluent Cranial nerve II-XII: Pupils were equal round reactive to light. Extraocular movements were full, visual field  were full on confrontational test. Facial sensation and strength were normal. Uvula tongue midline. Head turning and shoulder shrug  were normal and symmetric. Motor: The motor testing reveals 5 over 5 strength of all 4 extremities. Good symmetric motor tone is noted throughout.  Sensory: Sensory testing is intact to soft touch on all 4 extremities. No evidence of extinction is noted.  Coordination: Cerebellar testing reveals good finger-nose-finger and heel-to-shin bilaterally.  Gait and station: Gait is normal. Tandem gait is normal. Romberg is negative. No drift is seen.  Reflexes: Deep tendon reflexes are symmetric and normal bilaterally.  DIAGNOSTIC DATA (LABS, IMAGING, TESTING) - I reviewed patient records, labs, notes, testing and imaging myself where available.  Lab Results  Component Value Date   WBC 11.6 (H) 03/30/2023   HGB 12.5 (L) 03/30/2023   HCT 37.6 (L) 03/30/2023   MCV 93.1 03/30/2023   PLT 147 (L) 03/30/2023      Component Value Date/Time   NA 140 07/03/2023 0952   K 4.4 07/03/2023 0952   CL 102 07/03/2023 0952   CO2 29 07/03/2023 0952   GLUCOSE 85 07/03/2023 0952   GLUCOSE 108 (H) 03/30/2023 0600   BUN 12 07/03/2023 0952   CREATININE 0.81 07/03/2023 0952   CALCIUM 9.7 07/03/2023 0952   PROT 6.1 07/03/2023 0952   ALBUMIN 4.0 07/03/2023 0952   AST 21 07/03/2023 0952   ALT 13 07/03/2023 0952   ALKPHOS 65 07/03/2023 0952   BILITOT 0.5 07/03/2023 0952   GFRNONAA >60 03/30/2023 0600   GFRAA 92 01/29/2020 1016   Lab Results  Component Value Date   CHOL 126 07/03/2023   HDL 42 07/03/2023   LDLCALC 68 07/03/2023   TRIG 82 07/03/2023   CHOLHDL 3.0 07/03/2023   No results found for: "HGBA1C" Lab Results  Component Value Date   VITAMINB12 650 08/22/2021   No results found for: "TSH"  Margie Ege, AGNP-C, DNP 08/13/2023, 11:27 AM [facility] [address] [place], [place] [address] [phone]

## 2023-08-14 ENCOUNTER — Ambulatory Visit: Payer: Medicare Other | Admitting: Neurology

## 2023-08-14 ENCOUNTER — Encounter: Payer: Self-pay | Admitting: Neurology

## 2023-08-15 NOTE — Progress Notes (Signed)
COVID Vaccine received:  []  No []  Yes Date of any COVID positive Test in last 90 days:  PCP - Fatima Sanger, FNP clearance on chart Cardiologist - Olga Millers, MD  cardiac clearance on chart and in 07-03-23 Epic note Vascular Surgery- Heath Lark, MD Neurology- Ocie Doyne, MD clearance on chart  Chest x-ray - CT Chest w/o contrast 01-26-2022  Epic EKG -  07-03-2023  Epic Stress Test -  ECHO - 02-02-2022  Epic Cardiac Cath -   PCR screen: [x]  Ordered & Completed           []   No Order but Needs PROFEND           []   N/A for this surgery  Surgery Plan:  []  Ambulatory   []  Outpatient in bed  [x]  Admit  Anesthesia:    [x]  General  []  Spinal  []   Choice []   MAC  Pacemaker / ICD device [x]  No []  Yes   Spinal Cord Stimulator:[x]  No []  Yes       History of Sleep Apnea? [x]  No []  Yes   CPAP used?- [x]  No []  Yes    Does the patient monitor blood sugar?   [x]  N/A   []  No []  Yes  Patient has: [x]  NO Hx DM   []  Pre-DM   []  DM1  []   DM2 Last A1c was:        on       Blood Thinner / Instructions:None Aspirin Instructions:  ASA 81 mg  ERAS Protocol Ordered: []  No  [x]  Yes PRE-SURGERY [x]  ENSURE  []  G2   []  No Drink Ordered Patient is to be NPO after: 06:15  Dental hx: []  Dentures:        []  Bridge or Partial:                   []  Loose or Damaged teeth:     Comments: Patient was given the 5 CHG shower / bath instructions for  TKA surgery along with 2 bottles of the CHG soap. Patient will start this on: Monday  08-20-2023  All questions were asked and answered, Patient voiced understanding of this process.   Activity level: Patient is able / unable to climb a flight of stairs without difficulty; []  No CP  []  No SOB, but would have ___   Patient can / can not perform ADLs without assistance.   Anesthesia review: CM, HTN, anxiety, s/p EVAR on 04-18-2023- has Type II endoleak, CKD, LBBB, PVCs, HOH, Dementia- memory loss,  ETOH use  Patient denies shortness of breath, fever, cough  and chest pain at PAT appointment.  Patient verbalized understanding and agreement to the Pre-Surgical Instructions that were given to them at this PAT appointment. Patient was also educated of the need to review these PAT instructions again prior to his/her surgery.I reviewed the appropriate phone numbers to call if they have any and questions or concerns.

## 2023-08-15 NOTE — Patient Instructions (Signed)
SURGICAL WAITING ROOM VISITATION Patients having surgery or a procedure may have no more than 2 support people in the waiting area - these visitors may rotate in the visitor waiting room.   Due to an increase in RSV and influenza rates and associated hospitalizations, children ages 36 and under may not visit patients in [facility] hospitals. If the patient needs to stay at the hospital during part of their recovery, the visitor guidelines for inpatient rooms apply.  PRE-OP VISITATION  Pre-op nurse will coordinate an appropriate time for 1 support person to accompany the patient in pre-op.  This support person may not rotate.  This visitor will be contacted when the time is appropriate for the visitor to come back in the pre-op area.  Please refer to the [facility] website for the visitor guidelines for Inpatients (after your surgery is over and you are in a regular room).  You are not required to quarantine at this time prior to your surgery. However, you must do this: Hand Hygiene often Do NOT share personal items Notify your provider if you are in close contact with someone who has COVID or you develop fever 100.4 or greater, new onset of sneezing, cough, sore throat, shortness of breath or body aches.  If you test positive for Covid or have been in contact with anyone that has tested positive in the last 10 days please notify you surgeon.    Your procedure is scheduled on:  Friday   August 24, 2023  Report to [facility] Main Entrance: [facility] entrance where the [employer] is available.   Report to admitting at:  06:45  AM  Call this number if you have any questions or problems the morning of surgery [phone]  Do not eat food after Midnight the night prior to your surgery/procedure.  After Midnight you may have the following liquids until  06:15  AM DAY OF SURGERY  Clear Liquid Diet Water Black Coffee (sugar ok, NO MILK/CREAM OR CREAMERS)  Tea (sugar ok, NO  MILK/CREAM OR CREAMERS) regular and decaf                             Plain Jell-O  with no fruit (NO RED)                                           Fruit ices (not with fruit pulp, NO RED)                                     Popsicles (NO RED)                                                                  Juice: NO CITRUS JUICES: only apple, WHITE grape, WHITE cranberry Sports drinks like Gatorade or Powerade (NO RED)                   The day of surgery:  Drink ONE (1) Pre-Surgery Clear Ensure at  06:15  AM the morning of  surgery. Drink in one sitting. Do not sip.  This drink was given to you during your hospital pre-op appointment visit. Nothing else to drink after completing the Pre-Surgery Clear Ensure : No candy, chewing gum or throat lozenges.    FOLLOW ANY ADDITIONAL PRE OP INSTRUCTIONS YOU RECEIVED FROM YOUR SURGEON'S OFFICE!!!   Oral Hygiene is also important to reduce your risk of infection.        Remember - BRUSH YOUR TEETH THE MORNING OF SURGERY WITH YOUR REGULAR TOOTHPASTE  Do NOT smoke after Midnight the night before surgery.  STOP TAKING all Vitamins, Herbs and supplements 1 week before your surgery.   Take ONLY these medicines the morning of surgery with A SIP OF WATER:  Levothyroxine (Synthroid), amlodipine, escitalopram (Lexapro)                   You may not have any metal on your body including jewelry, and body piercing  Do not wear  lotions, powders, cologne, or deodorant  Men may shave face and neck.  Contacts, Hearing Aids, dentures or bridgework may not be worn into surgery. DENTURES WILL BE REMOVED PRIOR TO SURGERY PLEASE DO NOT APPLY "Poly grip" OR ADHESIVES!!!  You may bring a small overnight bag with you on the day of surgery, only pack items that are not valuable. [facility] IS NOT RESPONSIBLE   FOR VALUABLES THAT ARE LOST OR STOLEN.   Do not bring your home medications to the hospital. The Pharmacy will dispense medications listed on your  medication list to you during your admission in the Hospital.  Special Instructions: Bring a copy of your healthcare power of attorney and living will documents the day of surgery, if you wish to have them scanned into your [facility] Records- EPIC  Please read over the following fact sheets you were given: IF YOU HAVE QUESTIONS ABOUT YOUR PRE-OP INSTRUCTIONS, PLEASE CALL [phone].     Pre-operative 5 CHG Bath Instructions   You can play a key role in reducing the risk of infection after surgery. Your skin needs to be as free of germs as possible. You can reduce the number of germs on your skin by washing with CHG (chlorhexidine gluconate) soap before surgery. CHG is an antiseptic soap that kills germs and continues to kill germs even after washing.   DO NOT use if you have an allergy to chlorhexidine/CHG or antibacterial soaps. If your skin becomes reddened or irritated, stop using the CHG and notify one of our RNs at [phone]  Please shower with the CHG soap starting 4 days before surgery using the following schedule: START SHOWERS ON MONDAY  August 20, 2023  Please keep in mind the following:  DO NOT shave, including legs and underarms, starting the day of your first shower.   You may shave your face at any point before/day of surgery.   Place clean sheets on your bed the day you start using CHG soap. Use a clean washcloth (not used since being washed) for each shower. DO NOT sleep with pets once you start using the CHG.   CHG Shower Instructions:  If you choose to wash your hair and private area, wash first with your normal shampoo/soap.  After you use shampoo/soap, rinse your hair and body thoroughly to remove shampoo/soap residue.  Turn the water OFF and apply about 3 tablespoons  (45 ml) of CHG soap to a CLEAN washcloth.  Apply CHG soap ONLY FROM YOUR NECK DOWN TO YOUR TOES (washing for 3-5 minutes)  DO NOT use CHG soap on face, private areas, open wounds, or sores.  Pay special attention to the area where your surgery is being performed.  If you are having back surgery, having someone wash your back for you may be helpful.  Wait 2 minutes after CHG soap is applied, then you may rinse off the CHG soap.  Pat dry with a clean towel  Put on clean clothes/pajamas   If you choose to wear lotion, please use ONLY the CHG-compatible lotions on the back of this paper.     Additional instructions for the day of surgery: DO NOT APPLY any lotions, deodorants, cologne, or perfumes.   Put on clean/comfortable clothes.  Brush your teeth.  Ask your nurse before applying any prescription medications to the skin.      CHG Compatible Lotions   Aveeno Moisturizing lotion  Cetaphil Moisturizing Cream  Cetaphil Moisturizing Lotion  Clairol Herbal Essence Moisturizing Lotion, Dry Skin  Clairol Herbal Essence Moisturizing Lotion, Extra Dry Skin  Clairol Herbal Essence Moisturizing Lotion, Normal Skin  Curel Age Defying Therapeutic Moisturizing Lotion with Alpha Hydroxy  Curel Extreme Care Body Lotion  Curel Soothing Hands Moisturizing Hand Lotion  Curel Therapeutic Moisturizing Cream, Fragrance-Free  Curel Therapeutic Moisturizing Lotion, Fragrance-Free  Curel Therapeutic Moisturizing Lotion, Original Formula  Eucerin Daily Replenishing Lotion  Eucerin Dry Skin Therapy Plus Alpha Hydroxy Crme  Eucerin Dry Skin Therapy Plus Alpha Hydroxy Lotion  Eucerin Original Crme  Eucerin Original Lotion  Eucerin Plus Crme Eucerin Plus Lotion  Eucerin TriLipid Replenishing Lotion  Keri Anti-Bacterial Hand Lotion  Keri Deep Conditioning Original Lotion Dry Skin Formula Softly Scented  Keri Deep Conditioning Original Lotion, Fragrance Free Sensitive Skin Formula  Keri Lotion Fast  Absorbing Fragrance Free Sensitive Skin Formula  Keri Lotion Fast Absorbing Softly Scented Dry Skin Formula  Keri Original Lotion  Keri Skin Renewal Lotion Keri Silky Smooth Lotion  Keri Silky Smooth Sensitive Skin Lotion  Nivea Body Creamy Conditioning Oil  Nivea Body Extra Enriched Lotion  Nivea Body Original Lotion  Nivea Body Sheer Moisturizing Lotion Nivea Crme  Nivea Skin Firming Lotion  NutraDerm 30 Skin Lotion  NutraDerm Skin Lotion  NutraDerm Therapeutic Skin Cream  NutraDerm Therapeutic Skin Lotion  ProShield Protective Hand Cream  Provon moisturizing lotion   FAILURE TO FOLLOW THESE INSTRUCTIONS MAY RESULT IN THE CANCELLATION OF YOUR SURGERY  PATIENT SIGNATURE_________________________________  NURSE SIGNATURE__________________________________  ________________________________________________________________________

## 2023-08-16 ENCOUNTER — Encounter (HOSPITAL_COMMUNITY)
Admission: RE | Admit: 2023-08-16 | Discharge: 2023-08-16 | Disposition: A | Discharge status: Home / Self Care | Payer: Medicare Other | Source: Ambulatory Visit | Attending: Orthopedic Surgery | Admitting: Orthopedic Surgery

## 2023-08-16 DIAGNOSIS — I2510 Atherosclerotic heart disease of native coronary artery without angina pectoris: Secondary | ICD-10-CM

## 2023-08-16 DIAGNOSIS — Z01818 Encounter for other preprocedural examination: Secondary | ICD-10-CM

## 2023-08-16 DIAGNOSIS — R7989 Other specified abnormal findings of blood chemistry: Secondary | ICD-10-CM

## 2023-08-21 ENCOUNTER — Telehealth: Payer: Self-pay | Admitting: Psychiatry

## 2023-08-21 DIAGNOSIS — M25561 Pain in right knee: Secondary | ICD-10-CM | POA: Diagnosis not present

## 2023-08-21 NOTE — Telephone Encounter (Signed)
Pt's Brandon Valdez (on [place]) requesting not to reschedule appts right now. I normally come with him and I just had triple bypass surgery. When I can bring we will reschedule his appt.

## 2023-08-24 ENCOUNTER — Inpatient Hospital Stay (HOSPITAL_COMMUNITY): Admit: 2023-08-24 | Payer: Medicare Other | Admitting: Orthopedic Surgery

## 2023-08-24 SURGERY — ARTHROPLASTY, KNEE, TOTAL
Anesthesia: General | Site: Knee | Laterality: Right

## 2023-08-28 DIAGNOSIS — R0981 Nasal congestion: Secondary | ICD-10-CM | POA: Diagnosis not present

## 2023-08-28 DIAGNOSIS — R059 Cough, unspecified: Secondary | ICD-10-CM | POA: Diagnosis not present

## 2023-09-26 ENCOUNTER — Emergency Department (HOSPITAL_COMMUNITY): Payer: Medicare Other

## 2023-09-26 ENCOUNTER — Observation Stay (HOSPITAL_COMMUNITY)
Admission: EM | Admit: 2023-09-26 | Discharge: 2023-09-27 | Disposition: A | Discharge status: Home / Self Care | Payer: Medicare Other | Attending: Internal Medicine | Admitting: Internal Medicine

## 2023-09-26 ENCOUNTER — Encounter (HOSPITAL_COMMUNITY): Payer: Self-pay

## 2023-09-26 ENCOUNTER — Other Ambulatory Visit: Payer: Self-pay

## 2023-09-26 DIAGNOSIS — J189 Pneumonia, unspecified organism: Secondary | ICD-10-CM | POA: Diagnosis not present

## 2023-09-26 DIAGNOSIS — F17200 Nicotine dependence, unspecified, uncomplicated: Secondary | ICD-10-CM | POA: Diagnosis not present

## 2023-09-26 DIAGNOSIS — E785 Hyperlipidemia, unspecified: Secondary | ICD-10-CM | POA: Insufficient documentation

## 2023-09-26 DIAGNOSIS — E039 Hypothyroidism, unspecified: Secondary | ICD-10-CM | POA: Insufficient documentation

## 2023-09-26 DIAGNOSIS — R918 Other nonspecific abnormal finding of lung field: Secondary | ICD-10-CM | POA: Diagnosis not present

## 2023-09-26 DIAGNOSIS — Z87891 Personal history of nicotine dependence: Secondary | ICD-10-CM | POA: Diagnosis not present

## 2023-09-26 DIAGNOSIS — B9789 Other viral agents as the cause of diseases classified elsewhere: Secondary | ICD-10-CM | POA: Diagnosis not present

## 2023-09-26 DIAGNOSIS — Z8711 Personal history of peptic ulcer disease: Secondary | ICD-10-CM | POA: Insufficient documentation

## 2023-09-26 DIAGNOSIS — F329 Major depressive disorder, single episode, unspecified: Secondary | ICD-10-CM | POA: Insufficient documentation

## 2023-09-26 DIAGNOSIS — Z79899 Other long term (current) drug therapy: Secondary | ICD-10-CM | POA: Diagnosis not present

## 2023-09-26 DIAGNOSIS — Z1152 Encounter for screening for COVID-19: Secondary | ICD-10-CM | POA: Diagnosis not present

## 2023-09-26 DIAGNOSIS — I447 Left bundle-branch block, unspecified: Secondary | ICD-10-CM | POA: Insufficient documentation

## 2023-09-26 DIAGNOSIS — I10 Essential (primary) hypertension: Secondary | ICD-10-CM | POA: Diagnosis not present

## 2023-09-26 DIAGNOSIS — F419 Anxiety disorder, unspecified: Secondary | ICD-10-CM | POA: Insufficient documentation

## 2023-09-26 DIAGNOSIS — F1090 Alcohol use, unspecified, uncomplicated: Secondary | ICD-10-CM | POA: Diagnosis not present

## 2023-09-26 DIAGNOSIS — J069 Acute upper respiratory infection, unspecified: Principal | ICD-10-CM

## 2023-09-26 DIAGNOSIS — Z7901 Long term (current) use of anticoagulants: Secondary | ICD-10-CM | POA: Insufficient documentation

## 2023-09-26 DIAGNOSIS — Z79890 Hormone replacement therapy (postmenopausal): Secondary | ICD-10-CM | POA: Insufficient documentation

## 2023-09-26 DIAGNOSIS — R059 Cough, unspecified: Secondary | ICD-10-CM | POA: Diagnosis not present

## 2023-09-26 LAB — CBC WITH DIFFERENTIAL/PLATELET
Abs Immature Granulocytes: 0.03 10*3/uL (ref 0.00–0.07)
Basophils Absolute: 0.1 10*3/uL (ref 0.0–0.1)
Basophils Relative: 1 %
Eosinophils Absolute: 0.1 10*3/uL (ref 0.0–0.5)
Eosinophils Relative: 2 %
HCT: 47.2 % (ref 39.0–52.0)
Hemoglobin: 15.4 g/dL (ref 13.0–17.0)
Immature Granulocytes: 0 %
Lymphocytes Relative: 21 %
Lymphs Abs: 1.5 10*3/uL (ref 0.7–4.0)
MCH: 31.2 pg (ref 26.0–34.0)
MCHC: 32.6 g/dL (ref 30.0–36.0)
MCV: 95.7 fL (ref 80.0–100.0)
Monocytes Absolute: 0.7 10*3/uL (ref 0.1–1.0)
Monocytes Relative: 9 %
Neutro Abs: 4.9 10*3/uL (ref 1.7–7.7)
Neutrophils Relative %: 67 %
Platelets: 212 10*3/uL (ref 150–400)
RBC: 4.93 MIL/uL (ref 4.22–5.81)
RDW: 13.4 % (ref 11.5–15.5)
WBC: 7.3 10*3/uL (ref 4.0–10.5)
nRBC: 0 % (ref 0.0–0.2)

## 2023-09-26 LAB — COMPREHENSIVE METABOLIC PANEL
ALT: 19 U/L (ref 0–44)
AST: 22 U/L (ref 15–41)
Albumin: 3.8 g/dL (ref 3.5–5.0)
Alkaline Phosphatase: 58 U/L (ref 38–126)
Anion gap: 7 (ref 5–15)
BUN: 13 mg/dL (ref 8–23)
CO2: 28 mmol/L (ref 22–32)
Calcium: 9.1 mg/dL (ref 8.9–10.3)
Chloride: 100 mmol/L (ref 98–111)
Creatinine, Ser: 0.76 mg/dL (ref 0.61–1.24)
GFR, Estimated: >60 mL/min (ref >60)
Glucose, Bld: 157 mg/dL — ABNORMAL HIGH (ref 70–99)
Potassium: 3.7 mmol/L (ref 3.5–5.1)
Sodium: 135 mmol/L (ref 135–145)
Total Bilirubin: 0.8 mg/dL (ref <1.2)
Total Protein: 7 g/dL (ref 6.5–8.1)

## 2023-09-26 LAB — BRAIN NATRIURETIC PEPTIDE: B Natriuretic Peptide: 47.5 pg/mL (ref 0.0–100.0)

## 2023-09-26 LAB — TROPONIN I (HIGH SENSITIVITY)
Troponin I (High Sensitivity): 27 ng/L — ABNORMAL HIGH (ref <18)
Troponin I (High Sensitivity): 24 ng/L — ABNORMAL HIGH (ref <18)

## 2023-09-26 LAB — RESP PANEL BY RT-PCR (RSV, FLU A&B, COVID)  RVPGX2
Influenza A by PCR: NEGATIVE
Influenza B by PCR: NEGATIVE
Resp Syncytial Virus by PCR: NEGATIVE
SARS Coronavirus 2 by RT PCR: NEGATIVE

## 2023-09-26 LAB — PROCALCITONIN: Procalcitonin: <0.1 ng/mL

## 2023-09-26 MED ORDER — LEVOTHYROXINE SODIUM 150 MCG PO TABS
150.0000 ug | ORAL_TABLET | Freq: Every day | ORAL | Status: DC
  Administered 2023-09-27: 150 ug via ORAL
  Filled 2023-09-26: qty 1

## 2023-09-26 MED ORDER — ESCITALOPRAM OXALATE 20 MG PO TABS
20.0000 mg | ORAL_TABLET | Freq: Every day | ORAL | Status: DC
  Administered 2023-09-27: 20 mg via ORAL
  Filled 2023-09-26: qty 1

## 2023-09-26 MED ORDER — AMLODIPINE BESYLATE 5 MG PO TABS
5.0000 mg | ORAL_TABLET | Freq: Every day | ORAL | Status: DC
  Administered 2023-09-27: 5 mg via ORAL
  Filled 2023-09-26: qty 1

## 2023-09-26 MED ORDER — ROSUVASTATIN CALCIUM 10 MG PO TABS
20.0000 mg | ORAL_TABLET | Freq: Every day | ORAL | Status: DC
  Administered 2023-09-26 – 2023-09-27 (×2): 20 mg via ORAL
  Filled 2023-09-26 (×2): qty 2

## 2023-09-26 MED ORDER — SENNOSIDES-DOCUSATE SODIUM 8.6-50 MG PO TABS: 1.0000 | ORAL_TABLET | Freq: Every evening | ORAL | Status: DC | PRN

## 2023-09-26 MED ORDER — ACETAMINOPHEN 650 MG RE SUPP: 650.0000 mg | Freq: Four times a day (QID) | RECTAL | Status: DC | PRN

## 2023-09-26 MED ORDER — EZETIMIBE 10 MG PO TABS
10.0000 mg | ORAL_TABLET | Freq: Every day | ORAL | Status: DC
  Administered 2023-09-27: 10 mg via ORAL
  Filled 2023-09-26: qty 1

## 2023-09-26 MED ORDER — QUETIAPINE FUMARATE 100 MG PO TABS
100.0000 mg | ORAL_TABLET | Freq: Every day | ORAL | Status: DC
  Administered 2023-09-26: 100 mg via ORAL
  Filled 2023-09-26: qty 1

## 2023-09-26 MED ORDER — SODIUM CHLORIDE 0.9 % IV SOLN
1.0000 g | Freq: Once | INTRAVENOUS | Status: AC
  Administered 2023-09-26: 1 g via INTRAVENOUS
  Filled 2023-09-26: qty 10

## 2023-09-26 MED ORDER — ENOXAPARIN SODIUM 40 MG/0.4ML IJ SOSY
40.0000 mg | PREFILLED_SYRINGE | INTRAMUSCULAR | Status: DC
  Administered 2023-09-26: 40 mg via SUBCUTANEOUS
  Filled 2023-09-26: qty 0.4

## 2023-09-26 MED ORDER — SODIUM CHLORIDE 0.9 % IV SOLN: 2.0000 g | INTRAVENOUS | Status: DC

## 2023-09-26 MED ORDER — LEVOTHYROXINE SODIUM 137 MCG PO TABS: 137.0000 ug | ORAL_TABLET | Freq: Every day | ORAL | Status: DC

## 2023-09-26 MED ORDER — ONDANSETRON HCL 4 MG PO TABS: 4.0000 mg | ORAL_TABLET | Freq: Four times a day (QID) | ORAL | Status: DC | PRN

## 2023-09-26 MED ORDER — DEXTROSE 5 % IV SOLN
500.0000 mg | Freq: Once | INTRAVENOUS | Status: AC
  Administered 2023-09-26: 500 mg via INTRAVENOUS
  Filled 2023-09-26: qty 5

## 2023-09-26 MED ORDER — DM-GUAIFENESIN ER 30-600 MG PO TB12
1.0000 | ORAL_TABLET | Freq: Two times a day (BID) | ORAL | Status: DC
  Administered 2023-09-26 – 2023-09-27 (×2): 1 via ORAL
  Filled 2023-09-26 (×2): qty 1

## 2023-09-26 MED ORDER — ONDANSETRON HCL 4 MG/2ML IJ SOLN: 4.0000 mg | Freq: Four times a day (QID) | INTRAMUSCULAR | Status: DC | PRN

## 2023-09-26 MED ORDER — ACETAMINOPHEN 325 MG PO TABS: 650.0000 mg | ORAL_TABLET | Freq: Four times a day (QID) | ORAL | Status: DC | PRN

## 2023-09-26 MED ORDER — AZITHROMYCIN 250 MG PO TABS: 500.0000 mg | ORAL_TABLET | ORAL | Status: DC

## 2023-09-26 NOTE — ED Provider Notes (Signed)
  Physical Exam  BP 124/75   Pulse 68   Temp 98.1 F (36.7 C)   Resp 15   Ht 6\' 5"  (1.956 m)   Wt 85 kg   SpO2 92%   BMI 22.22 kg/m   Physical Exam Vitals and nursing note reviewed.  Constitutional:      General: He is not in acute distress.    Appearance: He is not toxic-appearing.  HENT:     Mouth/Throat:     Mouth: Mucous membranes are moist.  Eyes:     General: No scleral icterus. Cardiovascular:     Rate and Rhythm: Normal rate.  Pulmonary:     Effort: Pulmonary effort is normal. No respiratory distress.  Skin:    General: Skin is warm and dry.  Neurological:     Mental Status: He is alert.     Procedures  Procedures  ED Course / MDM    Medical Decision Making Amount and/or Complexity of Data Reviewed Labs: ordered. Radiology: ordered.  Risk Decision regarding hospitalization.   Accepted handoff at shift change from [facility], [place]. Please see prior provider note for more detail.   Briefly: Patient is 89 y.o. M here for cough and cold symptoms, no fever, for the past few days. No chest pain or SOB. Initially blood work was ordered, but the patient declined this.   DDX: concern for PNA versus URI versus viral illness  Plan: Follow up with CXR imaging.   CXR shows  Left basilar patchy opacities, which may represent atelectasis or pneumonia. Per radiologist's interpretation.   I spoke with patient and daughter at bedside.  Discussed with them about the port score recommending hospitalization for risk of mortality.  The are in agreements to do labs and for admission.  Patient does go into the lower 90s on room air.  I independently reviewed and interpreted the patient's lab. Troponins slightly elevated but flat. CMP shows mild increase in glucose at 157, otherwise no electrolyte or LFT abnormality. CBC without leukocytosis or anemia. BNP unremarkable. Resp panel negative for COVID, flu, RSV.   I given the patient some ceftriaxone and azithromycin.  Will  admit to medicine.  DG Chest 2 View  Result Date: 09/26/2023 CLINICAL DATA:  Cough EXAM: CHEST - 2 VIEW COMPARISON:  Chest CT dated 01/26/2022 FINDINGS: Normal lung volumes. Left basilar patchy opacities. No pleural effusion or pneumothorax. The heart size and mediastinal contours are within normal limits. No acute osseous abnormality. IMPRESSION: Left basilar patchy opacities, which may represent atelectasis or pneumonia. Electronically Signed   By: Agustin Cree M.D.   On: 09/26/2023 15:35         Achille Rich, Cordelia Poche 09/26/23 2047    Wynetta Fines, MD 09/26/23 2220

## 2023-09-26 NOTE — ED Triage Notes (Signed)
C/o nonproductive cough and congestion x5 days Mucinex w/ relief

## 2023-09-26 NOTE — ED Notes (Signed)
ED TO INPATIENT HANDOFF REPORT  ED Nurse Name and Phone #: Linus Orn Name/Age/Gender Brandon Valdez 89 y.o. male Room/Bed: WA02/WA02  Code Status   Code Status: Full Code  Home/SNF/Other Home Patient oriented to: self, place, time, and situation Is this baseline? Yes   Triage Complete: Triage complete  Chief Complaint Community acquired pneumonia [J18.9]  Triage Note C/o nonproductive cough and congestion x5 days Mucinex w/ relief   Allergies Allergies  Allergen Reactions   Losartan Potassium Cough   Monopril [Fosinopril] Other (See Comments)    Dizziness     Valium [Diazepam] Other (See Comments)    Shaking, flu like symptoms     Level of Care/Admitting Diagnosis ED Disposition     ED Disposition  Admit   Condition  --   Comment  Hospital Area: [facility] [[id number]]  Level of Care: Med-Surg [16]  May place patient in observation at [facility] or [facility] if equivalent level of care is available:: No  Covid Evaluation: Confirmed COVID Negative  Diagnosis: Community acquired pneumonia [235573]  Admitting Physician: Steffanie Rainwater [[id number]]  Attending Physician: Steffanie Rainwater [[id number]]          B Medical/Surgery History Past Medical History:  Diagnosis Date   AAA (abdominal aortic aneurysm) (HCC)    Arthritis    History of kidney stones    Hyperlipidemia    Hypertension    Hypothyroid    Nephrolithiasis    PUD (peptic ulcer disease)    Past Surgical History:  Procedure Laterality Date   ABDOMINAL AORTIC ENDOVASCULAR STENT GRAFT Bilateral 03/29/2023   Procedure: ABDOMINAL AORTIC ENDOVASCULAR STENT GRAFT;  Surgeon: Leonie Douglas, MD;  Location: [facility] OR;  Service: Vascular;  Laterality: Bilateral;   CATARACT EXTRACTION, BILATERAL     INGUINAL HERNIA REPAIR Left    spine cyst     TONSILLECTOMY     ULTRASOUND GUIDANCE FOR VASCULAR ACCESS Bilateral 03/29/2023   Procedure: ULTRASOUND GUIDANCE FOR VASCULAR ACCESS;   Surgeon: Leonie Douglas, MD;  Location: [facility] OR;  Service: Vascular;  Laterality: Bilateral;     A IV Location/Drains/Wounds Patient Lines/Drains/Airways Status     Active Line/Drains/Airways     Name Placement date Placement time Site Days   Peripheral IV 09/26/23 Right Antecubital 09/26/23  1730  Antecubital  less than 1            Intake/Output Last 24 hours No intake or output data in the 24 hours ending 09/26/23 2025  Labs/Imaging Results for orders placed or performed during the hospital encounter of 09/26/23 (from the past 48 hour(s))  Resp panel by RT-PCR (RSV, Flu A&B, Covid) Anterior Nasal Swab     Status: None   Collection Time: 09/26/23  2:04 PM   Specimen: Anterior Nasal Swab  Result Value Ref Range   SARS Coronavirus 2 by RT PCR NEGATIVE NEGATIVE    Comment: (NOTE) SARS-CoV-2 target nucleic acids are NOT DETECTED.  The SARS-CoV-2 RNA is generally detectable in upper respiratory specimens during the acute phase of infection. The lowest concentration of SARS-CoV-2 viral copies this assay can detect is 138 copies/mL. A negative result does not preclude SARS-Cov-2 infection and should not be used as the sole basis for treatment or other patient management decisions. A negative result may occur with  improper specimen collection/handling, submission of specimen other than nasopharyngeal swab, presence of viral mutation(s) within the areas targeted by this assay, and inadequate number of viral copies(<138 copies/mL). A negative  result must be combined with clinical observations, patient history, and epidemiological information. The expected result is Negative.  Fact Sheet for Patients:  BloggerCourse.com  Fact Sheet for Healthcare Providers:  SeriousBroker.it  This test is no t yet approved or cleared by the [place] FDA and  has been authorized for detection and/or diagnosis of SARS-CoV-2 by FDA under  an Emergency Use Authorization (EUA). This EUA will remain  in effect (meaning this test can be used) for the duration of the COVID-19 declaration under Section 564(b)(1) of the Act, 21 U.S.C.section 360bbb-3(b)(1), unless the authorization is terminated  or revoked sooner.       Influenza A by PCR NEGATIVE NEGATIVE   Influenza B by PCR NEGATIVE NEGATIVE    Comment: (NOTE) The Xpert Xpress SARS-CoV-2/FLU/RSV plus assay is intended as an aid in the diagnosis of influenza from Nasopharyngeal swab specimens and should not be used as a sole basis for treatment. Nasal washings and aspirates are unacceptable for Xpert Xpress SARS-CoV-2/FLU/RSV testing.  Fact Sheet for Patients: BloggerCourse.com  Fact Sheet for Healthcare Providers: SeriousBroker.it  This test is not yet approved or cleared by the [place] FDA and has been authorized for detection and/or diagnosis of SARS-CoV-2 by FDA under an Emergency Use Authorization (EUA). This EUA will remain in effect (meaning this test can be used) for the duration of the COVID-19 declaration under Section 564(b)(1) of the Act, 21 U.S.C. section 360bbb-3(b)(1), unless the authorization is terminated or revoked.     Resp Syncytial Virus by PCR NEGATIVE NEGATIVE    Comment: (NOTE) Fact Sheet for Patients: BloggerCourse.com  Fact Sheet for Healthcare Providers: SeriousBroker.it  This test is not yet approved or cleared by the [place] FDA and has been authorized for detection and/or diagnosis of SARS-CoV-2 by FDA under an Emergency Use Authorization (EUA). This EUA will remain in effect (meaning this test can be used) for the duration of the COVID-19 declaration under Section 564(b)(1) of the Act, 21 U.S.C. section 360bbb-3(b)(1), unless the authorization is terminated or revoked.  Performed at [facility], [address] [place], [place] [address]   Brain natriuretic peptide     Status: None   Collection Time: 09/26/23  5:32 PM  Result Value Ref Range   B Natriuretic Peptide 47.5 0.0 - 100.0 pg/mL    Comment: Performed at [facility], [address] [place], [place] [address]  Troponin I (High Sensitivity)     Status: Abnormal   Collection Time: 09/26/23  5:32 PM  Result Value Ref Range   Troponin I (High Sensitivity) 27 (H) <18 ng/L    Comment: (NOTE) Elevated high sensitivity troponin I (hsTnI) values and significant  changes across serial measurements may suggest ACS but many other  chronic and acute conditions are known to elevate hsTnI results.  Refer to the "Links" section for chest pain algorithms and additional  guidance. Performed at [facility], [address] [place], [place] [address]   CBC with Differential     Status: None   Collection Time: 09/26/23  5:32 PM  Result Value Ref Range   WBC 7.3 4.0 - 10.5 K/uL   RBC 4.93 4.22 - 5.81 MIL/uL   Hemoglobin 15.4 13.0 - 17.0 g/dL   HCT 21.3 08.6 - 57.8 %   MCV 95.7 80.0 - 100.0 fL   MCH 31.2 26.0 - 34.0 pg   MCHC 32.6 30.0 - 36.0 g/dL   RDW 46.9 62.9 - 52.8 %  Platelets 212 150 - 400 K/uL   nRBC 0.0 0.0 - 0.2 %   Neutrophils Relative % 67 %   Neutro Abs 4.9 1.7 - 7.7 K/uL   Lymphocytes Relative 21 %   Lymphs Abs 1.5 0.7 - 4.0 K/uL   Monocytes Relative 9 %   Monocytes Absolute 0.7 0.1 - 1.0 K/uL   Eosinophils Relative 2 %   Eosinophils Absolute 0.1 0.0 - 0.5 K/uL   Basophils Relative 1 %   Basophils Absolute 0.1 0.0 - 0.1 K/uL   Immature Granulocytes 0 %   Abs Immature Granulocytes 0.03 0.00 - 0.07 K/uL    Comment: Performed at [facility], [address] [place], [place] [address]  Comprehensive metabolic panel     Status: Abnormal   Collection Time: 09/26/23  5:32 PM  Result Value Ref Range   Sodium 135 135 - 145 mmol/L   Potassium 3.7 3.5 - 5.1  mmol/L   Chloride 100 98 - 111 mmol/L   CO2 28 22 - 32 mmol/L   Glucose, Bld 157 (H) 70 - 99 mg/dL    Comment: Glucose reference range applies only to samples taken after fasting for at least 8 hours.   BUN 13 8 - 23 mg/dL   Creatinine, Ser 4.69 0.61 - 1.24 mg/dL   Calcium 9.1 8.9 - 62.9 mg/dL   Total Protein 7.0 6.5 - 8.1 g/dL   Albumin 3.8 3.5 - 5.0 g/dL   AST 22 15 - 41 U/L   ALT 19 0 - 44 U/L   Alkaline Phosphatase 58 38 - 126 U/L   Total Bilirubin 0.8 <1.2 mg/dL   GFR, Estimated >52 >84 mL/min    Comment: (NOTE) Calculated using the CKD-EPI Creatinine Equation (2021)    Anion gap 7 5 - 15    Comment: Performed at [facility], [address] [place], [place] [address]  Troponin I (High Sensitivity)     Status: Abnormal   Collection Time: 09/26/23  6:52 PM  Result Value Ref Range   Troponin I (High Sensitivity) 24 (H) <18 ng/L    Comment: (NOTE) Elevated high sensitivity troponin I (hsTnI) values and significant  changes across serial measurements may suggest ACS but many other  chronic and acute conditions are known to elevate hsTnI results.  Refer to the "Links" section for chest pain algorithms and additional  guidance. Performed at [facility], [address] [place], [place] [address]    DG Chest 2 View  Result Date: 09/26/2023 CLINICAL DATA:  Cough EXAM: CHEST - 2 VIEW COMPARISON:  Chest CT dated 01/26/2022 FINDINGS: Normal lung volumes. Left basilar patchy opacities. No pleural effusion or pneumothorax. The heart size and mediastinal contours are within normal limits. No acute osseous abnormality. IMPRESSION: Left basilar patchy opacities, which may represent atelectasis or pneumonia. Electronically Signed   By: Agustin Cree M.D.   On: 09/26/2023 15:35    Pending Labs Unresulted Labs (From admission, onward)     Start     Ordered   09/26/23 2025  Procalcitonin  Once,   R       References:    Procalcitonin Lower Respiratory  Tract Infection AND Sepsis Procalcitonin Algorithm   09/26/23 2024   09/26/23 2025  Respiratory (~20 pathogens) panel by PCR  (Respiratory panel by PCR (~20 pathogens, ~24 hr TAT)  w precautions)  Once,   R        09/26/23 2025   09/26/23 2023  TSH  Once,   R        09/26/23 2022   09/26/23 2015  Expectorated Sputum Assessment w Gram Stain, Rflx to Resp Cult  (COPD / Pneumonia / Cellulitis / Lower Extremity Wound)  Once,   R        09/26/23 2016   09/26/23 2015  Legionella Pneumophila Serogp 1 Ur Ag  (COPD / Pneumonia / Cellulitis / Lower Extremity Wound)  Once,   R        09/26/23 2016   09/26/23 2015  Strep pneumoniae urinary antigen  (COPD / Pneumonia / Cellulitis / Lower Extremity Wound)  Once,   R        09/26/23 2016            Vitals/Pain Today's Vitals   09/26/23 1500 09/26/23 1728 09/26/23 1827 09/26/23 1852  BP: 124/75 (!) 163/86  (!) 148/78  Pulse: 68 66 74 74  Resp: 15 17 (!) 21 20  Temp:  98 F (36.7 C)    SpO2: 92% 96% 96% 96%  Weight:      Height:      PainSc:        Isolation Precautions No active isolations  Medications Medications  azithromycin (ZITHROMAX) 500 mg in dextrose 5 % 250 mL IVPB (500 mg Intravenous New Bag/Given 09/26/23 2017)  enoxaparin (LOVENOX) injection 40 mg (has no administration in time range)  acetaminophen (TYLENOL) tablet 650 mg (has no administration in time range)    Or  acetaminophen (TYLENOL) suppository 650 mg (has no administration in time range)  senna-docusate (Senokot-S) tablet 1 tablet (has no administration in time range)  ondansetron (ZOFRAN) tablet 4 mg (has no administration in time range)    Or  ondansetron (ZOFRAN) injection 4 mg (has no administration in time range)  cefTRIAXone (ROCEPHIN) 2 g in sodium chloride 0.9 % 100 mL IVPB (has no administration in time range)  azithromycin (ZITHROMAX) tablet 500 mg (has no administration in time range)  dextromethorphan-guaiFENesin (MUCINEX DM) 30-600 MG per 12 hr tablet  1 tablet (has no administration in time range)  amLODipine (NORVASC) tablet 5 mg (has no administration in time range)  escitalopram (LEXAPRO) tablet 20 mg (has no administration in time range)  ezetimibe (ZETIA) tablet 10 mg (has no administration in time range)  levothyroxine (SYNTHROID) tablet 137 mcg (has no administration in time range)  QUEtiapine (SEROQUEL) tablet 100 mg (has no administration in time range)  rosuvastatin (CRESTOR) tablet 20 mg (has no administration in time range)  cefTRIAXone (ROCEPHIN) 1 g in sodium chloride 0.9 % 100 mL IVPB (0 g Intravenous Stopped 09/26/23 2024)    Mobility walks     Focused Assessments Cardiac Assessment Handoff:    No results found for: "CKTOTAL", "CKMB", "CKMBINDEX", "TROPONINI" No results found for: "DDIMER" Does the Patient currently have chest pain? No    R Recommendations: See Admitting Provider Note  Report given to:   Additional Notes: aaox4, pt lives at home. Daughter at bedside.ambulates with no assistances.

## 2023-09-26 NOTE — Discharge Instructions (Signed)
Your x-ray and respiratory panel did not show any concern.  Likely a viral upper respiratory infection.  Continue to take over-the-counter medication.  Return for any concerning symptoms.  Follow-up with your primary care provider.

## 2023-09-26 NOTE — H&P (Signed)
History and Physical    Patient: Brandon Valdez:[id number] DOB: 08/21/1934 DOA: 09/26/2023 DOS: the patient was seen and examined on 09/26/2023 PCP: Fatima Sanger, FNP  Patient coming from: Home  Chief Complaint:  Chief Complaint  Patient presents with   Cough   HPI: Brandon Valdez is a 89 y.o. male with medical history significant of AAA, HLD, PVCs, memory loss, HTN, hypothyroidism, peptic ulcer disease and arthritis who presented for evaluation of URI symptoms.  Per daughter, patient had a cold with cough and congestion last Friday which was treated with OTC Robitussin.  This morning, patient felt a sensation of mucus stuck in his throat.  He had difficulty clearing the mucus and he felt like he could not breathe.  He denies any fevers, chills, chest pain, headaches or shortness of breath.  ED course: Normal vitals, afebrile.  WBC 7.3, troponin 27-24, CMP unremarkable, BNP 47.5, and COVID test.  Chest x-ray shows left bibasilar patchy opacity concerning for possible atelectasis or pneumonia.  Patient was given IV Rocephin and azithromycin.  Hospitalist was consulted for admission.  Review of Systems: As mentioned in the history of present illness. All other systems reviewed and are negative. Past Medical History:  Diagnosis Date   AAA (abdominal aortic aneurysm) (HCC)    Arthritis    History of kidney stones    Hyperlipidemia    Hypertension    Hypothyroid    Nephrolithiasis    PUD (peptic ulcer disease)    Past Surgical History:  Procedure Laterality Date   ABDOMINAL AORTIC ENDOVASCULAR STENT GRAFT Bilateral 03/29/2023   Procedure: ABDOMINAL AORTIC ENDOVASCULAR STENT GRAFT;  Surgeon: Leonie Douglas, MD;  Location: [facility] OR;  Service: Vascular;  Laterality: Bilateral;   CATARACT EXTRACTION, BILATERAL     INGUINAL HERNIA REPAIR Left    spine cyst     TONSILLECTOMY     ULTRASOUND GUIDANCE FOR VASCULAR ACCESS Bilateral 03/29/2023   Procedure: ULTRASOUND GUIDANCE FOR  VASCULAR ACCESS;  Surgeon: Leonie Douglas, MD;  Location: [facility] OR;  Service: Vascular;  Laterality: Bilateral;   Social History:  reports that he quit smoking about 47 years ago. His smoking use included cigarettes. He started smoking about 72 years ago. He has a 45 pack-year smoking history. He quit smokeless tobacco use about 47 years ago. He reports current alcohol use of about 7.0 standard drinks of alcohol per week. He reports that he does not use drugs.  Allergies  Allergen Reactions   Losartan Potassium Cough   Monopril [Fosinopril] Other (See Comments)    Dizziness     Valium [Diazepam] Other (See Comments)    Shaking, flu-like symptoms     Family History  Problem Relation Age of Onset   AAA (abdominal aortic aneurysm) Father    AAA (abdominal aortic aneurysm) Brother    Alzheimer's disease Brother    Panic disorder Son     Prior to Admission medications   Medication Sig Start Date End Date Taking? Authorizing Provider  amLODipine (NORVASC) 5 MG tablet Take 5 mg by mouth daily.   Yes [provider]  aspirin 81 MG tablet Take 1 tablet (81 mg total) by mouth daily. May resume after 1 week on July 4 Patient taking differently: Take 81 mg by mouth in the morning. 05/23/22  Yes Osvaldo Shipper, MD  cetirizine (ZYRTEC) 10 MG tablet Take 10 mg by mouth daily as needed for allergies. 03/25/20  Yes [provider]  donepezil (ARICEPT) 10 MG tablet Take  10 mg by mouth at bedtime.   Yes [provider]  escitalopram (LEXAPRO) 20 MG tablet Take 20 mg by mouth daily. 05/12/22  Yes [provider]  ezetimibe (ZETIA) 10 MG tablet TAKE 1 TABLET BY MOUTH DAILY Patient taking differently: Take 10 mg by mouth daily. 02/12/23  Yes Lewayne Bunting, MD  fluticasone (FLONASE) 50 MCG/ACT nasal spray Place 1 spray into both nostrils 2 (two) times daily as needed for allergies or rhinitis.   Yes [provider]  ibuprofen (ADVIL,MOTRIN) 200 MG tablet Take 400  mg by mouth every 6 (six) hours as needed for moderate pain (inflammation).   Yes [provider]  levothyroxine (SYNTHROID) 150 MCG tablet Take 150 mcg by mouth daily before breakfast.   Yes [provider]  meclizine (ANTIVERT) 25 MG tablet Take 25 mg by mouth 3 (three) times daily as needed for dizziness.   Yes [provider]  MUCINEX 600 MG 12 hr tablet Take 600-1,200 mg by mouth 2 (two) times daily as needed for to loosen phlegm or cough.   Yes [provider]  Multiple Vitamin (MULTIVITAMIN) capsule Take 1 capsule by mouth daily.   Yes [provider]  Psyllium (METAMUCIL SMOOTH TEXTURE PO) Take 1 Scoop by mouth See admin instructions. Mix 1 scoopful into 8 ounces of a desired beverage and drink by mouth once a day   Yes [provider]  QUEtiapine (SEROQUEL) 100 MG tablet Take 100 mg by mouth at bedtime.   Yes [provider]  rosuvastatin (CRESTOR) 20 MG tablet Take 1 tablet (20 mg total) by mouth daily. 05/25/23  Yes Lewayne Bunting, MD    Physical Exam: Vitals:   09/26/23 1500 09/26/23 1728 09/26/23 1827 09/26/23 1852  BP: 124/75 (!) 163/86  (!) 148/78  Pulse: 68 66 74 74  Resp: 15 17 (!) 21 20  Temp:  98 F (36.7 C)    SpO2: 92% 96% 96% 96%  Weight:      Height:       General: Pleasant, well-appearing elderly man sitting in bed. No acute distress. Head: Normocephalic. Atraumatic. CV: RRR. No murmurs, rubs, or gallops. No LE edema Pulmonary: Lungs CTAB. Normal effort. No wheezing, rhonchi or rales. Abdominal: Soft, nontender, nondistended. Normal bowel sounds. Extremities: Palpable radial and DP pulses. Normal ROM. Skin: Warm and dry. No obvious rash or lesions. Neuro: A&Ox3. Moves all extremities. Normal sensation. No focal deficit. Psych: Normal mood and affect  Data Reviewed:  Labs without leukocytosis or electrolyte abnormalities EKG shows sinus rhythm with PVCs  Assessment and Plan: KETIH GOODIE  is a 89 y.o. male with medical history significant of AAA, HLD, PVCs, memory loss, anxiety, HTN, hypothyroidism, peptic ulcer disease and arthritis who presented for evaluation of URI symptoms and admitted for possible pneumonia.  # Community-acquired pneumonia Elderly patient with recent chest congestion, and cough found to have possible pneumonia on imaging. He is stable from respiratory standpoint with no O2 requirements. He remains afebrile without leukocytosis.  Rhythm flu negative. Will check for viral panel to rule out a viral infection. -Admit for observation -Continue Rocephin and azithromycin for now -Check procalcitonin to determine further need for antibiotics -Follow-up sputum culture, Legionella and strep pneumo antigens -Mucinex dm as needed for cough -Trend CBC, fever curve  # HTN BP elevated with SBP in the 120s to 140s. -Amlodipine 5 mg daily  # Hypothyroidism -Check TSH -Synthroid 150 mcg daily  # Anxiety -Quetiapine 100 mg daily at  bedtime -Escitalopram 20 mg daily  # HLD -Zetia 10 mg daily -Rosuvastatin 20 mg daily   Advance Care Planning:   Code Status: Full Code   Consults: None  Family Communication: Discussed admission with daughter at bedside  Severity of Illness: The appropriate patient status for this patient is OBSERVATION. Observation status is judged to be reasonable and necessary in order to provide the required intensity of service to ensure the patient's safety. The patient's presenting symptoms, physical exam findings, and initial radiographic and laboratory data in the context of their medical condition is felt to place them at decreased risk for further clinical deterioration. Furthermore, it is anticipated that the patient will be medically stable for discharge from the hospital within 2 midnights of admission.   Author: Steffanie Rainwater, MD 09/26/2023 8:56 PM  For on call review [email or web address].

## 2023-09-26 NOTE — ED Provider Notes (Signed)
Brandon Valdez Provider Note   CSN: [id number] Arrival date & time: 09/26/23  1208     History  Chief Complaint  Patient presents with   Cough    Brandon Valdez is a 89 y.o. male.  89 year old male presents today for concern of URI symptoms.  He has been feeling sick with URI symptoms for the past several days.  His daughter is at bedside and she states that she brought him cough and cold medication 2 days ago including Robitussin that he has not been taking this medication.  No fever.  No chest pain.  No shortness of breath.  He states today he had a sensation that his mucus got caught in his throat and he could not breathe.  It lasted about 5 minutes.  The history is provided by the patient. No language interpreter was used.       Home Medications Prior to Admission medications   Medication Sig Start Date End Date Taking? Authorizing Provider  amLODipine (NORVASC) 5 MG tablet Take 5 mg by mouth daily.    [provider]  aspirin 81 MG tablet Take 1 tablet (81 mg total) by mouth daily. May resume after 1 week on July 4 Patient taking differently: Take 81 mg by mouth daily. 05/23/22   Brandon Shipper, MD  cetirizine (ZYRTEC) 10 MG tablet Take 10 mg by mouth daily as needed for allergies. 03/25/20   [provider]  escitalopram (LEXAPRO) 20 MG tablet Take 20 mg by mouth daily. 05/12/22   [provider]  ezetimibe (ZETIA) 10 MG tablet TAKE 1 TABLET BY MOUTH DAILY 02/12/23   Brandon Bunting, MD  ibuprofen (ADVIL,MOTRIN) 200 MG tablet Take 400 mg by mouth every 6 (six) hours as needed for moderate pain (inflammation).    [provider]  levothyroxine (SYNTHROID) 137 MCG tablet Take 137 mcg by mouth daily before breakfast.    [provider]  meclizine (ANTIVERT) 25 MG tablet Take 25 mg by mouth 3 (three) times daily as needed for dizziness.    [provider]  Multiple Vitamin  (MULTIVITAMIN) capsule Take 1 capsule by mouth daily.    [provider]  psyllium (METAMUCIL) 58.6 % packet Take 1 packet by mouth daily.    [provider]  QUEtiapine (SEROQUEL) 100 MG tablet Take 100 mg by mouth at bedtime.    [provider]  rosuvastatin (CRESTOR) 20 MG tablet Take 1 tablet (20 mg total) by mouth daily. 05/25/23   Brandon Bunting, MD      Allergies    Losartan potassium, Monopril [fosinopril], and Valium [diazepam]    Review of Systems   Review of Systems  Constitutional:  Negative for chills and fever.  HENT:  Positive for congestion and postnasal drip. Negative for sore throat.   Respiratory:  Positive for cough. Negative for shortness of breath.   Cardiovascular:  Negative for chest pain and palpitations.  Neurological:  Negative for light-headedness.  All other systems reviewed and are negative.   Physical Exam Updated Vital Signs BP 107/75   Pulse 88   Temp 98.1 F (36.7 C)   Resp 20   Ht 6\' 5"  (1.956 m)   Wt 85 kg   SpO2 97%   BMI 22.22 kg/m  Physical Exam Vitals and nursing note reviewed.  Constitutional:      General: He is not in acute distress.    Appearance: Normal appearance. He is not ill-appearing.  HENT:     Head: Normocephalic and atraumatic.     Nose: Nose normal.  Eyes:     Conjunctiva/sclera: Conjunctivae normal.  Cardiovascular:     Rate and Rhythm: Normal rate and regular rhythm.     Heart sounds: Normal heart sounds.  Pulmonary:     Effort: Pulmonary effort is normal. No respiratory distress.     Breath sounds: Normal breath sounds. No wheezing or rales.  Musculoskeletal:        General: No deformity. Normal range of motion.     Cervical back: Normal range of motion.  Skin:    Findings: No rash.  Neurological:     Mental Status: He is alert.     ED Results / Procedures / Treatments   Labs (all labs ordered are listed, but only abnormal results are displayed) Labs Reviewed  RESP PANEL  BY RT-PCR (RSV, FLU A&B, COVID)  [id number]  CBC WITH DIFFERENTIAL/PLATELET  BASIC METABOLIC PANEL  TROPONIN I (HIGH SENSITIVITY)    EKG None  Radiology No results found.  Procedures Procedures    Medications Ordered in ED Medications - No data to display  ED Course/ Medical Decision Making/ A&P                                 Medical Decision Making Amount and/or Complexity of Data Reviewed Labs: ordered. Radiology: ordered.   89 year old male presents today for concern of URI symptoms.  Today he felt like a mucous plug was stuck in his throat and he could not breathe for a few minutes.  Denies any chest pain, shortness of breath.  Does endorse URI symptoms for the past 4 to 5 days.  Has not been taking over-the-counter medications consistently.  Currently otherwise asymptomatic.  Chest x-ray, respiratory panel was ordered in triage.  Currently pending.  He was agreeable to also have blood work such as CBC, BMP, and troponin ordered although he was without chest pain.  Later decided not to have the blood work done but is agreeable to wait until x-ray results.  Will obtain EKG.  EKG without acute ischemic change.  Signout to oncoming provider pending chest x-ray.  If this is normal patient is appropriate for discharge with symptomatic management.  Final Clinical Impression(s) / ED Diagnoses Final diagnoses:  Viral URI with cough    Rx / DC Orders ED Discharge Orders     None         Brandon [place], PA-C 09/26/23 1509    Brandon Snare K, DO 09/26/23 1511

## 2023-09-27 ENCOUNTER — Other Ambulatory Visit (HOSPITAL_COMMUNITY): Payer: Self-pay

## 2023-09-27 DIAGNOSIS — E785 Hyperlipidemia, unspecified: Secondary | ICD-10-CM

## 2023-09-27 DIAGNOSIS — F419 Anxiety disorder, unspecified: Secondary | ICD-10-CM

## 2023-09-27 DIAGNOSIS — F325 Major depressive disorder, single episode, in full remission: Secondary | ICD-10-CM | POA: Diagnosis not present

## 2023-09-27 DIAGNOSIS — I10 Essential (primary) hypertension: Secondary | ICD-10-CM | POA: Diagnosis not present

## 2023-09-27 DIAGNOSIS — J189 Pneumonia, unspecified organism: Secondary | ICD-10-CM | POA: Diagnosis not present

## 2023-09-27 DIAGNOSIS — E039 Hypothyroidism, unspecified: Secondary | ICD-10-CM

## 2023-09-27 LAB — RESPIRATORY PANEL BY PCR

## 2023-09-27 LAB — CBC
HCT: 42.8 % (ref 39.0–52.0)
Hemoglobin: 13.8 g/dL (ref 13.0–17.0)
MCH: 30.8 pg (ref 26.0–34.0)
MCHC: 32.2 g/dL (ref 30.0–36.0)
MCV: 95.5 fL (ref 80.0–100.0)
Platelets: 179 10*3/uL (ref 150–400)
RBC: 4.48 MIL/uL (ref 4.22–5.81)
RDW: 13.1 % (ref 11.5–15.5)
WBC: 5.4 10*3/uL (ref 4.0–10.5)
nRBC: 0 % (ref 0.0–0.2)

## 2023-09-27 LAB — BASIC METABOLIC PANEL
Anion gap: 9 (ref 5–15)
BUN: 14 mg/dL (ref 8–23)
CO2: 27 mmol/L (ref 22–32)
Calcium: 8.5 mg/dL — ABNORMAL LOW (ref 8.9–10.3)
Chloride: 101 mmol/L (ref 98–111)
Creatinine, Ser: 0.85 mg/dL (ref 0.61–1.24)
GFR, Estimated: >60 mL/min (ref >60)
Glucose, Bld: 102 mg/dL — ABNORMAL HIGH (ref 70–99)
Potassium: 3.8 mmol/L (ref 3.5–5.1)
Sodium: 137 mmol/L (ref 135–145)

## 2023-09-27 LAB — T4, FREE: Free T4: 1.3 ng/dL — ABNORMAL HIGH (ref 0.61–1.12)

## 2023-09-27 LAB — TSH: TSH: 0.184 u[IU]/mL — ABNORMAL LOW (ref 0.350–4.500)

## 2023-09-27 MED ORDER — IPRATROPIUM-ALBUTEROL 20-100 MCG/ACT IN AERS
2.0000 | INHALATION_SPRAY | Freq: Three times a day (TID) | RESPIRATORY_TRACT | Status: DC
  Filled 2023-09-27: qty 4

## 2023-09-27 MED ORDER — LORATADINE 10 MG PO TABS
10.0000 mg | ORAL_TABLET | Freq: Every day | ORAL | 0 refills | Status: AC
  Filled 2023-09-27: qty 30, 30d supply, fill #0

## 2023-09-27 MED ORDER — PANTOPRAZOLE SODIUM 40 MG PO TBEC
40.0000 mg | DELAYED_RELEASE_TABLET | Freq: Every day | ORAL | 1 refills | Status: AC
  Filled 2023-09-27: qty 30, 30d supply, fill #0

## 2023-09-27 MED ORDER — AZITHROMYCIN 250 MG PO TABS
250.0000 mg | ORAL_TABLET | Freq: Every day | ORAL | 0 refills | Status: AC
  Filled 2023-09-27: qty 4, 4d supply, fill #0

## 2023-09-27 MED ORDER — PANTOPRAZOLE SODIUM 40 MG PO TBEC
40.0000 mg | DELAYED_RELEASE_TABLET | Freq: Every day | ORAL | Status: DC
  Administered 2023-09-27: 40 mg via ORAL
  Filled 2023-09-27: qty 1

## 2023-09-27 MED ORDER — ACETAMINOPHEN 325 MG PO TABS: 650.0000 mg | ORAL_TABLET | Freq: Four times a day (QID) | ORAL | Status: AC | PRN

## 2023-09-27 MED ORDER — LORATADINE 10 MG PO TABS
10.0000 mg | ORAL_TABLET | Freq: Every day | ORAL | Status: DC
  Administered 2023-09-27: 10 mg via ORAL
  Filled 2023-09-27: qty 1

## 2023-09-27 MED ORDER — FLUTICASONE PROPIONATE 50 MCG/ACT NA SUSP
NASAL | 0 refills | Status: DC
  Filled 2023-09-27: qty 16, 30d supply, fill #0

## 2023-09-27 MED ORDER — ALBUTEROL SULFATE HFA 108 (90 BASE) MCG/ACT IN AERS
INHALATION_SPRAY | RESPIRATORY_TRACT | 0 refills | Status: AC
  Filled 2023-09-27: qty 6.7, 27d supply, fill #0

## 2023-09-27 MED ORDER — DM-GUAIFENESIN ER 30-600 MG PO TB12
1.0000 | ORAL_TABLET | Freq: Two times a day (BID) | ORAL | 0 refills | Status: AC
  Filled 2023-09-27: qty 10, 5d supply, fill #0

## 2023-09-27 MED ORDER — FLUTICASONE PROPIONATE 50 MCG/ACT NA SUSP
2.0000 | Freq: Every day | NASAL | Status: DC
  Administered 2023-09-27: 2 via NASAL
  Filled 2023-09-27: qty 16

## 2023-09-27 MED ORDER — IPRATROPIUM-ALBUTEROL 0.5-2.5 (3) MG/3ML IN SOLN: 3.0000 mL | RESPIRATORY_TRACT | Status: DC | PRN

## 2023-09-27 MED ORDER — IPRATROPIUM-ALBUTEROL 0.5-2.5 (3) MG/3ML IN SOLN
3.0000 mL | Freq: Three times a day (TID) | RESPIRATORY_TRACT | Status: DC
  Administered 2023-09-27: 3 mL via RESPIRATORY_TRACT
  Filled 2023-09-27: qty 3

## 2023-09-27 NOTE — TOC Initial Note (Signed)
Transition of Care [facility]) - Initial/Assessment Note    Patient Details  Name: Brandon Valdez MRN: [id number] Date of Birth: [birth year]  Transition of [facility]) CM/SW Contact:    Adrian Prows, RN Phone Number: 09/27/2023, 11:39 AM  Clinical Narrative:                Pt is HOH; spoke w/ dtr Brandon Valdez [phone]); she says pt is from home and they plan for him to return at d/c; she denies pt experiencing SDOH risks; he has transportation; she says pt has glasses; his dtr says he has hearing aides that he does not wear; he has a cane; she says pt does not have HH services or home oxygen; TOC will follow.  Expected Discharge Plan: Home/Self Care Barriers to Discharge: Continued Medical Work up   Patient Goals and CMS Choice Patient states their goals for this hospitalization and ongoing recovery are:: pt's dtr Brandon Valdez says pt will return home          Expected Discharge Plan and Services   Discharge Planning Services: CM Consult   Living arrangements for the past 2 months: Single Family Home                                      Prior Living Arrangements/Services Living arrangements for the past 2 months: Single Family Home Lives with:: Self Patient language and need for interpreter reviewed:: Yes Do you feel safe going back to the place where you live?: Yes      Need for Family Participation in Patient Care: Yes (Comment) Care giver support system in place?: Yes (comment) Current home services: DME (cane) Criminal Activity/Legal Involvement Pertinent to Current Situation/Hospitalization: No - Comment as needed  Activities of Daily Living   ADL Screening (condition at time of admission) Independently performs ADLs?: Yes (appropriate for developmental age) Is the patient deaf or have difficulty hearing?: Yes Does the patient have difficulty seeing, even when wearing glasses/contacts?: No Does the patient have difficulty concentrating,  remembering, or making decisions?: No  Permission Sought/Granted Permission sought to share information with : Case Manager Permission granted to share information with : Yes, Verbal Permission Granted  Share Information with NAME: Case Manager     Permission granted to share info w Relationship: Brandon Valdez (dtr) [phone]     Emotional Assessment Appearance:: Other (Comment Required (unable to assess) Attitude/Demeanor/Rapport: Unable to Assess Affect (typically observed): Unable to Assess Orientation: :  (unable to assess) Alcohol / Substance Use: Not Applicable Psych Involvement: No (comment)  Admission diagnosis:  Community acquired pneumonia [J18.9] Viral URI with cough [J06.9] Patient Active Problem List   Diagnosis Date Noted   Community acquired pneumonia 09/26/2023   Abdominal aortic aneurysm (AAA) greater than 5.5 cm in diameter in male [facility]) 03/29/2023   Acute diverticulitis 05/15/2022   Rectal bleeding 05/14/2022   Osteoarthritis of knee 02/21/2022   Memory loss 02/21/2022   Allergic rhinitis 01/18/2021   Benign prostatic hyperplasia 01/18/2021   Chronic kidney disease due to hypertension 01/18/2021   ED (erectile dysfunction) of organic origin 01/18/2021   Essential hypertension 01/18/2021   Hyperlipidemia 01/18/2021   Hypothyroidism 01/18/2021   Left bundle branch block 01/18/2021   Major depression, single episode 01/18/2021   Microscopic hematuria 01/18/2021   Osteopenia 01/18/2021   Pulmonary nodule 01/18/2021   Tobacco use disorder 01/18/2021   Peripheral vascular disease (HCC) 06/02/2019  Bilateral carpal tunnel syndrome 05/01/2019   DDD (degenerative disc disease), cervical 01/28/2019   Bilateral hand pain 01/22/2019   AAA (abdominal aortic aneurysm) without rupture (HCC) 01/20/2019   Neck pain 01/14/2018   Presbycusis of both ears 01/14/2018   Referred otalgia of left ear 01/14/2018   Allergy to pollen 11/23/2016   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 [place] [address] Phone: [phone] Fax: [phone]     Social Determinants of Health (SDOH) Social History: SDOH Screenings   Food Insecurity: No Food Insecurity (09/27/2023)  Housing: Low Risk  (09/27/2023)  Transportation Needs: No Transportation Needs (09/27/2023)  Utilities: Not At Risk (09/27/2023)  Depression (PHQ2-9): Low Risk  (01/03/2023)  Tobacco Use: Medium Risk (09/26/2023)   SDOH Interventions: Food Insecurity Interventions: Intervention Not Indicated, Inpatient TOC Housing Interventions: Intervention Not Indicated, Inpatient TOC Transportation Interventions: Intervention Not Indicated, Inpatient TOC Utilities Interventions: Intervention Not Indicated, Inpatient TOC   Readmission Risk Interventions     No data to display

## 2023-09-27 NOTE — Plan of Care (Signed)
  Problem: Education: Goal: Knowledge of discharge needs will improve Outcome: Progressing   Problem: Clinical Measurements: Goal: Postoperative complications will be avoided or minimized Outcome: Progressing   Problem: Respiratory: Goal: Ability to achieve and maintain a regular respiratory rate will improve Outcome: Progressing   Problem: Skin Integrity: Goal: Demonstration of wound healing without infection will improve Outcome: Progressing   Problem: Activity: Goal: Ability to tolerate increased activity will improve Outcome: Progressing   Problem: Clinical Measurements: Goal: Ability to maintain a body temperature in the normal range will improve Outcome: Progressing   Problem: Respiratory: Goal: Ability to maintain adequate ventilation will improve Outcome: Progressing Goal: Ability to maintain a clear airway will improve Outcome: Progressing   Problem: Education: Goal: Knowledge of General Education information will improve Description: Including pain rating scale, medication(s)/side effects and non-pharmacologic comfort measures Outcome: Progressing   Problem: Health Behavior/Discharge Planning: Goal: Ability to manage health-related needs will improve Outcome: Progressing   Problem: Clinical Measurements: Goal: Ability to maintain clinical measurements within normal limits will improve Outcome: Progressing Goal: Will remain free from infection Outcome: Progressing Goal: Diagnostic test results will improve Outcome: Progressing Goal: Respiratory complications will improve Outcome: Progressing Goal: Cardiovascular complication will be avoided Outcome: Progressing   Problem: Activity: Goal: Risk for activity intolerance will decrease Outcome: Progressing   Problem: Nutrition: Goal: Adequate nutrition will be maintained Outcome: Progressing   Problem: Coping: Goal: Level of anxiety will decrease Outcome: Progressing   Problem: Elimination: Goal:  Will not experience complications related to bowel motility Outcome: Progressing Goal: Will not experience complications related to urinary retention Outcome: Progressing   Problem: Pain Management: Goal: General experience of comfort will improve Outcome: Progressing   Problem: Safety: Goal: Ability to remain free from injury will improve Outcome: Progressing   Problem: Skin Integrity: Goal: Risk for impaired skin integrity will decrease Outcome: Progressing

## 2023-09-27 NOTE — Progress Notes (Signed)
Discharge meds to bed delivered by this RN, given to primary RN, Aundra Millet. Meds in a secure bag

## 2023-09-27 NOTE — Discharge Summary (Signed)
Physician Discharge Summary  Brandon Valdez XBJ:[id number] DOB: February 02, 1934 DOA: 09/26/2023  PCP: Fatima Sanger, FNP  Admit date: 09/26/2023 Discharge date: 09/27/2023  Time spent: 60 minutes  Recommendations for Outpatient Follow-up:  Follow-up with Fatima Sanger, FNP in 1 to 2 weeks.  On follow-up patient will need repeat thyroid function studies done and further management of hypothyroidism.   Discharge Diagnoses:  Principal Problem:   Community acquired pneumonia Active Problems:   Essential hypertension   Hyperlipidemia   Hypothyroidism   Major depression, single episode   Tobacco use disorder   Anxiety   Discharge Condition: Stable and improved.  Diet recommendation: Regular  Filed Weights   09/26/23 1216  Weight: 85 kg    History of present illness:  HPI per Dr.Amponsah Brandon Valdez is a 89 y.o. male with medical history significant of AAA, HLD, PVCs, memory loss, HTN, hypothyroidism, peptic ulcer disease and arthritis who presented for evaluation of URI symptoms.  Per daughter, patient had a cold with cough and congestion last Friday which was treated with OTC Robitussin.  This morning, patient felt a sensation of mucus stuck in his throat.  He had difficulty clearing the mucus and he felt like he could not breathe.  He denies any fevers, chills, chest pain, headaches or shortness of breath.   ED course: Normal vitals, afebrile.  WBC 7.3, troponin 27-24, CMP unremarkable, BNP 47.5, and COVID test.  Chest x-ray shows left bibasilar patchy opacity concerning for possible atelectasis or pneumonia.  Patient was given IV Rocephin and azithromycin.  Hospitalist was consulted for admission.  Hospital Course:  #1 community-acquired pneumonia -Patient 89 year old gentleman history of AAA, hyperlipidemia, PVCs, memory loss, anxiety, hypertension, history of peptic ulcer disease and arthritis presented to the ED with upper respiratory symptoms of cough,  congestion. -Chest x-ray done on imaging was concerning for possible pneumonia. -Patient noted from respiratory standpoint on admission with no new oxygen requirements.  Patient remained afebrile without a leukocytosis. -SARS coronavirus 2 PCR negative, influenza A and B PCR negative, RSV by PCR noted to be negative. -Patient initially placed empirically on IV Rocephin and azithromycin and respiratory viral panel was ordered. -Patient was also placed on Mucinex, Flonase, PPI, scheduled DuoNebs, Claritin with clinical improvement. -BNP done at 47.5.  Troponin I done was flattened. -Procalcitonin noted to be negative. -Patient will be discharged on 4 more days of azithromycin, Mucinex DM, Flonase, Claritin, albuterol MDI. -Outpatient follow-up with PCP 1 to 2 weeks.  2.  Hypertension -Patient maintained on home regimen amlodipine.  3.  Hypothyroidism -TSH noted at 0.184 with a free T 41.30. -Patient maintained on home regimen Synthroid. -Patient will need outpatient follow-up with PCP for repeat thyroid function study test to be done and further management of Synthroid.  4.  Depression/anxiety -Patient maintained on home regimen Seroquel and escitalopram.  5.  Hyperlipidemia -Patient maintained on home regimen rosuvastatin and Zetia.  Procedures: Chest x-ray 09/26/2023   Consultations: None  Discharge Exam: Vitals:   09/27/23 1158 09/27/23 1348  BP: 132/67   Pulse: 72   Resp: 16   Temp: (!) 97.4 F (36.3 C)   SpO2: 94% 96%    General: NAD Cardiovascular: RRR no murmurs rubs or gallops.  No JVD.  No lower extremity edema. Respiratory: Clear to auscultation bilaterally.  No wheezes, no crackles, no rhonchi.  Fair air movement.  Speaking in full sentences.  Discharge Instructions   Discharge Instructions     Diet general   Complete  by: As directed    Increase activity slowly   Complete by: As directed       Allergies as of 09/27/2023       Reactions   Losartan  Potassium Cough   Monopril [fosinopril] Other (See Comments)   Dizziness    Valium [diazepam] Other (See Comments)   Shaking, flu-like symptoms         Medication List     TAKE these medications    acetaminophen 325 MG tablet Commonly known as: TYLENOL Take 2 tablets (650 mg total) by mouth every 6 (six) hours as needed for mild pain (pain score 1-3) (or Fever >/= 101).   albuterol 108 (90 Base) MCG/ACT inhaler Commonly known as: VENTOLIN HFA Inhale 2 puffs into the lungs 3 (three) times daily for 5 days, THEN 2 puffs every 6 (six) hours as needed for wheezing or shortness of breath. Start taking on: September 27, 2023   amLODipine 5 MG tablet Commonly known as: NORVASC Take 5 mg by mouth daily.   aspirin 81 MG tablet Take 1 tablet (81 mg total) by mouth daily. May resume after 1 week on July 4 What changed:  when to take this additional instructions   azithromycin 250 MG tablet Commonly known as: ZITHROMAX Take 1 tablet (250 mg total) by mouth daily for 4 days.   cetirizine 10 MG tablet Commonly known as: ZYRTEC Take 10 mg by mouth daily as needed for allergies.   dextromethorphan-guaiFENesin 30-600 MG 12hr tablet Commonly known as: MUCINEX DM Take 1 tablet by mouth 2 (two) times daily for 5 days.   donepezil 10 MG tablet Commonly known as: ARICEPT Take 10 mg by mouth at bedtime.   escitalopram 20 MG tablet Commonly known as: LEXAPRO Take 20 mg by mouth daily.   ezetimibe 10 MG tablet Commonly known as: ZETIA TAKE 1 TABLET BY MOUTH DAILY   fluticasone 50 MCG/ACT nasal spray Commonly known as: FLONASE Place 2 sprays into both nostrils daily for 5 days, THEN 2 sprays 2 (two) times daily as needed for allergies or rhinitis. Start taking on: September 27, 2023 What changed: See the new instructions.   ibuprofen 200 MG tablet Commonly known as: ADVIL Take 400 mg by mouth every 6 (six) hours as needed for moderate pain (inflammation).   levothyroxine 150 MCG  tablet Commonly known as: SYNTHROID Take 150 mcg by mouth daily before breakfast.   loratadine 10 MG tablet Commonly known as: CLARITIN Take 1 tablet (10 mg total) by mouth daily. Start taking on: September 28, 2023   meclizine 25 MG tablet Commonly known as: ANTIVERT Take 25 mg by mouth 3 (three) times daily as needed for dizziness.   METAMUCIL SMOOTH TEXTURE PO Take 1 Scoop by mouth See admin instructions. Mix 1 scoopful into 8 ounces of a desired beverage and drink by mouth once a day   Mucinex 600 MG 12 hr tablet Generic drug: guaiFENesin Take 600-1,200 mg by mouth 2 (two) times daily as needed for to loosen phlegm or cough.   multivitamin capsule Take 1 capsule by mouth daily.   pantoprazole 40 MG tablet Commonly known as: PROTONIX Take 1 tablet (40 mg total) by mouth daily at 6 (six) AM. Start taking on: September 28, 2023   QUEtiapine 100 MG tablet Commonly known as: SEROQUEL Take 100 mg by mouth at bedtime.   rosuvastatin 20 MG tablet Commonly known as: CRESTOR Take 1 tablet (20 mg total) by mouth daily.  Allergies  Allergen Reactions   Losartan Potassium Cough   Monopril [Fosinopril] Other (See Comments)    Dizziness     Valium [Diazepam] Other (See Comments)    Shaking, flu-like symptoms     Follow-up Information     Fatima Sanger, FNP. Schedule an appointment as soon as possible for a visit in 1 week(s).   Specialty: Internal Medicine Why: Follow-up in 1 to 2 weeks. Contact information: [address] [place] [address] [phone]                  The results of significant diagnostics from this hospitalization (including imaging, microbiology, ancillary and laboratory) are listed below for reference.    Significant Diagnostic Studies: DG Chest 2 View  Result Date: 09/26/2023 CLINICAL DATA:  Cough EXAM: CHEST - 2 VIEW COMPARISON:  Chest CT dated 01/26/2022 FINDINGS: Normal lung volumes. Left basilar patchy  opacities. No pleural effusion or pneumothorax. The heart size and mediastinal contours are within normal limits. No acute osseous abnormality. IMPRESSION: Left basilar patchy opacities, which may represent atelectasis or pneumonia. Electronically Signed   By: Agustin Cree M.D.   On: 09/26/2023 15:35    Microbiology: Recent Results (from the past 240 hour(s))  Resp panel by RT-PCR (RSV, Flu A&B, Covid) Anterior Nasal Swab     Status: None   Collection Time: 09/26/23  2:04 PM   Specimen: Anterior Nasal Swab  Result Value Ref Range Status   SARS Coronavirus 2 by RT PCR NEGATIVE NEGATIVE Final    Comment: (NOTE) SARS-CoV-2 target nucleic acids are NOT DETECTED.  The SARS-CoV-2 RNA is generally detectable in upper respiratory specimens during the acute phase of infection. The lowest concentration of SARS-CoV-2 viral copies this assay can detect is 138 copies/mL. A negative result does not preclude SARS-Cov-2 infection and should not be used as the sole basis for treatment or other patient management decisions. A negative result may occur with  improper specimen collection/handling, submission of specimen other than nasopharyngeal swab, presence of viral mutation(s) within the areas targeted by this assay, and inadequate number of viral copies(<138 copies/mL). A negative result must be combined with clinical observations, patient history, and epidemiological information. The expected result is Negative.  Fact Sheet for Patients:  BloggerCourse.com  Fact Sheet for Healthcare Providers:  SeriousBroker.it  This test is no t yet approved or cleared by the [place] FDA and  has been authorized for detection and/or diagnosis of SARS-CoV-2 by FDA under an Emergency Use Authorization (EUA). This EUA will remain  in effect (meaning this test can be used) for the duration of the COVID-19 declaration under Section 564(b)(1) of the Act,  21 U.S.C.section 360bbb-3(b)(1), unless the authorization is terminated  or revoked sooner.       Influenza A by PCR NEGATIVE NEGATIVE Final   Influenza B by PCR NEGATIVE NEGATIVE Final    Comment: (NOTE) The Xpert Xpress SARS-CoV-2/FLU/RSV plus assay is intended as an aid in the diagnosis of influenza from Nasopharyngeal swab specimens and should not be used as a sole basis for treatment. Nasal washings and aspirates are unacceptable for Xpert Xpress SARS-CoV-2/FLU/RSV testing.  Fact Sheet for Patients: BloggerCourse.com  Fact Sheet for Healthcare Providers: SeriousBroker.it  This test is not yet approved or cleared by the [place] FDA and has been authorized for detection and/or diagnosis of SARS-CoV-2 by FDA under an Emergency Use Authorization (EUA). This EUA will remain in effect (meaning this test can be used) for the  duration of the COVID-19 declaration under Section 564(b)(1) of the Act, 21 U.S.C. section 360bbb-3(b)(1), unless the authorization is terminated or revoked.     Resp Syncytial Virus by PCR NEGATIVE NEGATIVE Final    Comment: (NOTE) Fact Sheet for Patients: BloggerCourse.com  Fact Sheet for Healthcare Providers: SeriousBroker.it  This test is not yet approved or cleared by the [place] FDA and has been authorized for detection and/or diagnosis of SARS-CoV-2 by FDA under an Emergency Use Authorization (EUA). This EUA will remain in effect (meaning this test can be used) for the duration of the COVID-19 declaration under Section 564(b)(1) of the Act, 21 U.S.C. section 360bbb-3(b)(1), unless the authorization is terminated or revoked.  Performed at [facility], [address], [place], [place] [address]      Labs: Basic Metabolic Panel: Recent Labs  Lab 09/26/23 1732 09/27/23 0440  NA 135 137  K 3.7 3.8  CL 100 101   CO2 28 27  GLUCOSE 157* 102*  BUN 13 14  CREATININE 0.76 0.85  CALCIUM 9.1 8.5*   Liver Function Tests: Recent Labs  Lab 09/26/23 1732  AST 22  ALT 19  ALKPHOS 58  BILITOT 0.8  PROT 7.0  ALBUMIN 3.8   No results for input(s): "LIPASE", "AMYLASE" in the last 168 hours. No results for input(s): "AMMONIA" in the last 168 hours. CBC: Recent Labs  Lab 09/26/23 1732 09/27/23 0440  WBC 7.3 5.4  NEUTROABS 4.9  --   HGB 15.4 13.8  HCT 47.2 42.8  MCV 95.7 95.5  PLT 212 179   Cardiac Enzymes: No results for input(s): "CKTOTAL", "CKMB", "CKMBINDEX", "TROPONINI" in the last 168 hours. BNP: BNP (last 3 results) Recent Labs    09/26/23 1732  BNP 47.5    ProBNP (last 3 results) No results for input(s): "PROBNP" in the last 8760 hours.  CBG: No results for input(s): "GLUCAP" in the last 168 hours.     Signed:  Ramiro Harvest MD.  [facility] 09/27/2023, 3:14 PM

## 2023-09-28 ENCOUNTER — Other Ambulatory Visit: Payer: Self-pay

## 2023-09-28 DIAGNOSIS — Z9889 Other specified postprocedural states: Secondary | ICD-10-CM

## 2023-10-04 DIAGNOSIS — E039 Hypothyroidism, unspecified: Secondary | ICD-10-CM | POA: Diagnosis not present

## 2023-10-04 DIAGNOSIS — J189 Pneumonia, unspecified organism: Secondary | ICD-10-CM | POA: Diagnosis not present

## 2023-10-04 DIAGNOSIS — Z09 Encounter for follow-up examination after completed treatment for conditions other than malignant neoplasm: Secondary | ICD-10-CM | POA: Diagnosis not present

## 2023-10-11 DIAGNOSIS — M25561 Pain in right knee: Secondary | ICD-10-CM | POA: Diagnosis not present

## 2023-10-15 ENCOUNTER — Other Ambulatory Visit: Payer: Medicare Other

## 2023-10-23 ENCOUNTER — Inpatient Hospital Stay: Admission: RE | Admit: 2023-10-23 | Payer: Medicare Other | Source: Ambulatory Visit

## 2023-10-25 ENCOUNTER — Other Ambulatory Visit (HOSPITAL_COMMUNITY): Payer: Self-pay

## 2023-10-30 ENCOUNTER — Ambulatory Visit: Payer: Medicare Other | Admitting: Vascular Surgery

## 2023-11-01 ENCOUNTER — Ambulatory Visit
Admission: RE | Admit: 2023-11-01 | Discharge: 2023-11-01 | Disposition: A | Discharge status: Home / Self Care | Payer: Medicare Other | Source: Ambulatory Visit | Attending: Vascular Surgery | Admitting: Vascular Surgery

## 2023-11-01 DIAGNOSIS — I700 Atherosclerosis of aorta: Secondary | ICD-10-CM | POA: Diagnosis not present

## 2023-11-01 DIAGNOSIS — Z8679 Personal history of other diseases of the circulatory system: Secondary | ICD-10-CM

## 2023-11-01 DIAGNOSIS — N200 Calculus of kidney: Secondary | ICD-10-CM | POA: Diagnosis not present

## 2023-11-01 DIAGNOSIS — I723 Aneurysm of iliac artery: Secondary | ICD-10-CM | POA: Diagnosis not present

## 2023-11-01 DIAGNOSIS — K5730 Diverticulosis of large intestine without perforation or abscess without bleeding: Secondary | ICD-10-CM | POA: Diagnosis not present

## 2023-11-01 MED ORDER — IOPAMIDOL (ISOVUE-370) INJECTION 76%
100.0000 mL | Freq: Once | INTRAVENOUS | Status: AC | PRN
  Administered 2023-11-01: 100 mL via INTRAVENOUS

## 2023-11-26 DIAGNOSIS — F0280 Dementia in other diseases classified elsewhere without behavioral disturbance: Secondary | ICD-10-CM | POA: Diagnosis not present

## 2023-11-26 DIAGNOSIS — G309 Alzheimer's disease, unspecified: Secondary | ICD-10-CM | POA: Diagnosis not present

## 2023-11-26 NOTE — Progress Notes (Signed)
 [facility] [place]  ASSESSMENT / PLAN: Brandon Valdez is a 89 y.o. male status post EVAR 03/29/23 for 55mm infrarenal abdominal aortic aneurysm  Recommend: Complete cessation from all tobacco products. Blood glucose control with goal A1c < 7%. Blood pressure control with goal blood pressure < 140/90 mmHg. Lipid reduction therapy with goal LDL-C <100 mg/dL. Aspirin  81mg  PO QD.  Atorvastatin 40-80mg  PO QD (or other high intensity statin therapy).  Type two endoleak persists, but no meaningful growth of aneurysm sac. Check CT angiogram of abdomen / pelvis again in 12 months.  CHIEF COMPLAINT: growth of AAA  HISTORY OF PRESENT ILLNESS: Brandon Valdez is a 89 y.o. male turns to clinic to discuss enlarging abdominal aortic aneurysm.  The patient has been followed by our group for many years and has seen slow growth of the aneurysm.  Over the past 12 months he is grown from 4.6 cm to 5.3 cm.  The patient is planned to undergo right total knee arthroplasty, but this has been delayed for definitive management of the aneurysm.  We reviewed the 2 main ways of repairing aneurysms.  We reviewed the rationale for CT angiogram to help plan the repair.  04/30/23: Patient returns to clinic after EVAR.  He had a CT angiogram for surveillance.  This showed a type II endoleak.  We reviewed this finding and the rationale for closer surveillance with an additional CT scan in 6 months.  He reports some residual groin soreness, but otherwise feels great.  11/26/22: Patient returns to care for surveillance of EVAR.  He is doing well.  We reviewed his CT scans in detail.  I counseled him about the persistence of a type II endoleak.  I counseled him there is not much sac growth, suggesting against a clinically significant leak.  Past Medical History:  Diagnosis Date   AAA (abdominal aortic aneurysm) (HCC)    Arthritis    History of kidney stones    Hyperlipidemia    Hypertension     Hypothyroid    Nephrolithiasis    PUD (peptic ulcer disease)     Past Surgical History:  Procedure Laterality Date   ABDOMINAL AORTIC ENDOVASCULAR STENT GRAFT Bilateral 03/29/2023   Procedure: ABDOMINAL AORTIC ENDOVASCULAR STENT GRAFT;  Surgeon: Magda Debby SAILOR, MD;  Location: [facility] OR;  Service: Vascular;  Laterality: Bilateral;   CATARACT EXTRACTION, BILATERAL     INGUINAL HERNIA REPAIR Left    spine cyst     TONSILLECTOMY     ULTRASOUND GUIDANCE FOR VASCULAR ACCESS Bilateral 03/29/2023   Procedure: ULTRASOUND GUIDANCE FOR VASCULAR ACCESS;  Surgeon: Magda Debby SAILOR, MD;  Location: [facility] OR;  Service: Vascular;  Laterality: Bilateral;    Family History  Problem Relation Age of Onset   AAA (abdominal aortic aneurysm) Father    AAA (abdominal aortic aneurysm) Brother    Alzheimer's disease Brother    Panic disorder Son     Social History   Socioeconomic History   Marital status: Single    Spouse name: Not on file   Number of children: 3   Years of education: Not on file   Highest education level: Not on file  Occupational History   Not on file  Tobacco Use   Smoking status: Former    Current packs/day: 0.00    Average packs/day: 1 pack/day for 45.0 years (45.0 ttl pk-yrs)    Types: Cigarettes    Start date: 23    Quit date: 1977  Years since quitting: 48.0   Smokeless tobacco: Former    Quit date: 11/21/1975  Vaping Use   Vaping status: Never Used  Substance and Sexual Activity   Alcohol use: Yes    Alcohol/week: 7.0 standard drinks of alcohol    Types: 7 Glasses of wine per week    Comment: glass if wine in the pm   Drug use: No   Sexual activity: Not on file  Other Topics Concern   Not on file  Social History Narrative   Not on file   Social Drivers of Health   Financial Resource Strain: Not on file  Food Insecurity: No Food Insecurity (09/27/2023)   Hunger Vital Sign    Worried About Running Out of Food in the Last Year: Never true    Ran Out of Food in the  Last Year: Never true  Transportation Needs: No Transportation Needs (09/27/2023)   PRAPARE - Administrator, Civil Service (Medical): No    Lack of Transportation (Non-Medical): No  Physical Activity: Not on file  Stress: Not on file  Social Connections: Not on file  Intimate Partner Violence: Not At Risk (09/27/2023)   Humiliation, Afraid, Rape, and Kick questionnaire    Fear of Current or Ex-Partner: No    Emotionally Abused: No    Physically Abused: No    Sexually Abused: No    Allergies  Allergen Reactions   Losartan  Potassium Cough   Monopril [Fosinopril] Other (See Comments)    Dizziness     Valium [Diazepam] Other (See Comments)    Shaking, flu-like symptoms     Current Outpatient Medications  Medication Sig Dispense Refill   acetaminophen  (TYLENOL ) 325 MG tablet Take 2 tablets (650 mg total) by mouth every 6 (six) hours as needed for mild pain (pain score 1-3) (or Fever >/= 101).     albuterol  (VENTOLIN  HFA) 108 (90 Base) MCG/ACT inhaler Inhale 2 puffs into the lungs 3 (three) times daily for 5 days, THEN 2 puffs every 6 (six) hours as needed for wheezing or shortness of breath. 8 g 0   amLODipine  (NORVASC ) 5 MG tablet Take 5 mg by mouth daily.     aspirin  81 MG tablet Take 1 tablet (81 mg total) by mouth daily. May resume after 1 week on July 4 (Patient taking differently: Take 81 mg by mouth in the morning.) 30 tablet    cetirizine (ZYRTEC) 10 MG tablet Take 10 mg by mouth daily as needed for allergies.     donepezil  (ARICEPT ) 10 MG tablet Take 10 mg by mouth at bedtime.     escitalopram  (LEXAPRO ) 20 MG tablet Take 20 mg by mouth daily.     ezetimibe  (ZETIA ) 10 MG tablet TAKE 1 TABLET BY MOUTH DAILY (Patient taking differently: Take 10 mg by mouth daily.) 90 tablet 3   fluticasone  (FLONASE ) 50 MCG/ACT nasal spray Place 2 sprays into both nostrils daily for 5 days, THEN 2 sprays 2 (two) times daily as needed for allergies or rhinitis. 16 g 0   ibuprofen  (ADVIL,MOTRIN) 200 MG tablet Take 400 mg by mouth every 6 (six) hours as needed for moderate pain (inflammation).     levothyroxine  (SYNTHROID ) 150 MCG tablet Take 150 mcg by mouth daily before breakfast.     loratadine  (CLARITIN ) 10 MG tablet Take 1 tablet (10 mg total) by mouth daily. 30 tablet 0   meclizine  (ANTIVERT ) 25 MG tablet Take 25 mg by mouth 3 (three) times daily as needed  for dizziness.     MUCINEX  600 MG 12 hr tablet Take 600-1,200 mg by mouth 2 (two) times daily as needed for to loosen phlegm or cough.     Multiple Vitamin (MULTIVITAMIN) capsule Take 1 capsule by mouth daily.     pantoprazole  (PROTONIX ) 40 MG tablet Take 1 tablet (40 mg total) by mouth daily at 6 (six) AM. 30 tablet 1   Psyllium (METAMUCIL SMOOTH TEXTURE PO) Take 1 Scoop by mouth See admin instructions. Mix 1 scoopful into 8 ounces of a desired beverage and drink by mouth once a day     QUEtiapine  (SEROQUEL ) 100 MG tablet Take 100 mg by mouth at bedtime.     rosuvastatin  (CRESTOR ) 20 MG tablet Take 1 tablet (20 mg total) by mouth daily. 90 tablet 2   No current facility-administered medications for this visit.    PHYSICAL EXAM There were no vitals filed for this visit.   Well-appearing elderly man in no distress Regular rate and rhythm Unlabored breathing Easily palpable pedal pulses Groins healing well.  No evidence of infection.  Easily palpable femoral pulses.  PERTINENT LABORATORY AND RADIOLOGIC DATA  Most recent CBC    Latest Ref Rng & Units 09/27/2023    4:40 AM 09/26/2023    5:32 PM 03/30/2023    6:00 AM  CBC  WBC 4.0 - 10.5 K/uL 5.4  7.3  11.6   Hemoglobin 13.0 - 17.0 g/dL 86.1  84.5  87.4   Hematocrit 39.0 - 52.0 % 42.8  47.2  37.6   Platelets 150 - 400 K/uL 179  212  147      Most recent CMP    Latest Ref Rng & Units 09/27/2023    4:40 AM 09/26/2023    5:32 PM 07/03/2023    9:52 AM  CMP  Glucose 70 - 99 mg/dL 897  842  85   BUN 8 - 23 mg/dL 14  13  12    Creatinine 0.61 - 1.24 mg/dL  9.14  9.23  9.18   Sodium 135 - 145 mmol/L 137  135  140   Potassium 3.5 - 5.1 mmol/L 3.8  3.7  4.4   Chloride 98 - 111 mmol/L 101  100  102   CO2 22 - 32 mmol/L 27  28  29    Calcium  8.9 - 10.3 mg/dL 8.5  9.1  9.7   Total Protein 6.5 - 8.1 g/dL  7.0  6.1   Total Bilirubin <1.2 mg/dL  0.8  0.5   Alkaline Phos 38 - 126 U/L  58  65   AST 15 - 41 U/L  22  21   ALT 0 - 44 U/L  19  13     Renal function CrCl cannot be calculated (Patient's most recent lab result is older than the maximum 21 days allowed.).  No results found for: HGBA1C  LDL Chol Calc (NIH)  Date Value Ref Range Status  07/03/2023 68 0 - 99 mg/dL Final    CT angiogram shows Type 2 endoleak. Stable sac size.   Debby SAILOR. Magda, MD FACS [facility] Phone Number: [phone] 11/26/2023 8:34 AM   Total time spent on preparing this encounter including chart review, data review, collecting history, examining the patient, coordinating care for this established patient, 40 minutes.  Portions of this report may have been transcribed using voice recognition software.  Every effort has been made to ensure accuracy; however, inadvertent computerized transcription errors may still  be present.

## 2023-11-27 ENCOUNTER — Ambulatory Visit (INDEPENDENT_AMBULATORY_CARE_PROVIDER_SITE_OTHER): Payer: Medicare Other | Admitting: Vascular Surgery

## 2023-11-27 ENCOUNTER — Encounter: Payer: Self-pay | Admitting: Vascular Surgery

## 2023-11-27 VITALS — BP 122/76 | HR 82 | Temp 98.2°F | Resp 20 | Ht 77.0 in | Wt 193.0 lb

## 2023-11-27 DIAGNOSIS — I9789 Other postprocedural complications and disorders of the circulatory system, not elsewhere classified: Secondary | ICD-10-CM

## 2023-11-29 DIAGNOSIS — M25561 Pain in right knee: Secondary | ICD-10-CM | POA: Diagnosis not present

## 2024-01-03 DIAGNOSIS — M1711 Unilateral primary osteoarthritis, right knee: Secondary | ICD-10-CM | POA: Diagnosis not present

## 2024-01-03 DIAGNOSIS — F0280 Dementia in other diseases classified elsewhere without behavioral disturbance: Secondary | ICD-10-CM | POA: Diagnosis not present

## 2024-01-03 DIAGNOSIS — G309 Alzheimer's disease, unspecified: Secondary | ICD-10-CM | POA: Diagnosis not present

## 2024-01-10 DIAGNOSIS — M25561 Pain in right knee: Secondary | ICD-10-CM | POA: Diagnosis not present

## 2024-01-22 ENCOUNTER — Encounter: Payer: Self-pay | Admitting: Neurology

## 2024-01-22 ENCOUNTER — Ambulatory Visit: Payer: Medicare Other | Admitting: Neurology

## 2024-01-22 VITALS — BP 104/68 | HR 91 | Ht 72.0 in | Wt 186.5 lb

## 2024-01-22 DIAGNOSIS — G3184 Mild cognitive impairment, so stated: Secondary | ICD-10-CM | POA: Diagnosis not present

## 2024-01-22 MED ORDER — RIVASTIGMINE TARTRATE 1.5 MG PO CAPS: 1.5000 mg | ORAL_CAPSULE | Freq: Two times a day (BID) | ORAL | 0 refills | Status: DC

## 2024-01-22 NOTE — Progress Notes (Signed)
 [facility]  PATIENT: Brandon Valdez DOB: 1934-02-03  REQUESTING CLINICIAN: Fatima Sanger, FNP HISTORY FROM: Patient, family and chart review  REASON FOR VISIT: Memory loss follow up   HISTORICAL  CHIEF COMPLAINT:  Chief Complaint  Patient presents with   Room 12    With his Daughter and Son. Pt states that he has been okay. Pt states that he has sleeping problems.     HISTORY OF PRESENT ILLNESS:  This 89 year old gentleman past medical history of hypertension, hyperlipidemia, hypothyroidism, depression, mild cognitive impairment versus mild dementia who is presenting for follow-up for his memory issue.  Patient was previously seen by Dr. Delena Bali, last visit was a year ago.  At that time he was started on Aricept, but developed vivid dreams therefore discontinued the medication.  Family tells me that he is stable, spent most of the time at his girlfriend's house, he is independent in all activities of daily living, able to cook, clean and take care of himself.  Family does help with his checkbook and his medications.  He still drives short distance, denies being lost in family or places.  Denies any new concerns. They also reports that sometimes he is repetitive and also has word finding difficulties.    TBI: No past history of TBI Stroke:  no past history of stroke Seizures:  no past history of seizures Sleep:  no history of sleep apnea.   Mood: Yes depression  Family history of Dementia: Brother  Denies  Functional status: independent in all ADLs and IADLs Patient lives with girlfriend . Cooking: yes Cleaning: yes Shopping: yes Bathing: yes Toileting: yes Driving: Yes Bills: Daughter helping with the bills  Medications: Family helps with his pill box Ever left the stove on by accident?: denies Forget how to use items around the house?: denies Getting lost going to familiar places?: Denies  Forgetting loved ones names?: Denies  Word finding  difficulty? Yes  Sleep: ok   OTHER MEDICAL CONDITIONS: Hypertension, hyperlipidemia, hypothyroidism, memory loss chronic pain.   REVIEW OF SYSTEMS: Full 14 system review of systems performed and negative with exception of: As noted in the HPI  ALLERGIES: Allergies  Allergen Reactions   Losartan Potassium Cough   Monopril [Fosinopril] Other (See Comments)    Dizziness     Valium [Diazepam] Other (See Comments)    Shaking, flu-like symptoms     HOME MEDICATIONS: Outpatient Medications Prior to Visit  Medication Sig Dispense Refill   acetaminophen (TYLENOL) 325 MG tablet Take 2 tablets (650 mg total) by mouth every 6 (six) hours as needed for mild pain (pain score 1-3) (or Fever >/= 101).     albuterol (VENTOLIN HFA) 108 (90 Base) MCG/ACT inhaler Inhale 2 puffs into the lungs 3 (three) times daily for 5 days, THEN 2 puffs every 6 (six) hours as needed for wheezing or shortness of breath. 8 g 0   amLODipine (NORVASC) 5 MG tablet Take 5 mg by mouth daily.     aspirin 81 MG tablet Take 1 tablet (81 mg total) by mouth daily. May resume after 1 week on July 4 (Patient taking differently: Take 81 mg by mouth in the morning.) 30 tablet    cetirizine (ZYRTEC) 10 MG tablet Take 10 mg by mouth daily as needed for allergies.     escitalopram (LEXAPRO) 20 MG tablet Take 20 mg by mouth daily.     ezetimibe (ZETIA) 10 MG tablet TAKE 1 TABLET BY MOUTH DAILY (Patient taking differently:  Take 10 mg by mouth daily.) 90 tablet 3   fluticasone (FLONASE) 50 MCG/ACT nasal spray Place 2 sprays into both nostrils daily for 5 days, THEN 2 sprays 2 (two) times daily as needed for allergies or rhinitis. 16 g 0   ibuprofen (ADVIL,MOTRIN) 200 MG tablet Take 400 mg by mouth every 6 (six) hours as needed for moderate pain (inflammation).     levothyroxine (SYNTHROID) 150 MCG tablet Take 150 mcg by mouth daily before breakfast.     loratadine (CLARITIN) 10 MG tablet Take 1 tablet (10 mg total) by mouth daily. 30  tablet 0   meclizine (ANTIVERT) 25 MG tablet Take 25 mg by mouth 3 (three) times daily as needed for dizziness.     MUCINEX 600 MG 12 hr tablet Take 600-1,200 mg by mouth 2 (two) times daily as needed for to loosen phlegm or cough.     Multiple Vitamin (MULTIVITAMIN) capsule Take 1 capsule by mouth daily.     pantoprazole (PROTONIX) 40 MG tablet Take 1 tablet (40 mg total) by mouth daily at 6 (six) AM. 30 tablet 1   Psyllium (METAMUCIL SMOOTH TEXTURE PO) Take 1 Scoop by mouth See admin instructions. Mix 1 scoopful into 8 ounces of a desired beverage and drink by mouth once a day     QUEtiapine (SEROQUEL) 100 MG tablet Take 100 mg by mouth at bedtime.     rosuvastatin (CRESTOR) 20 MG tablet Take 1 tablet (20 mg total) by mouth daily. 90 tablet 2   donepezil (ARICEPT) 10 MG tablet Take 10 mg by mouth at bedtime. (Patient not taking: Reported on 01/22/2024)     No facility-administered medications prior to visit.    PAST MEDICAL HISTORY: Past Medical History:  Diagnosis Date   AAA (abdominal aortic aneurysm) (HCC)    Arthritis    History of kidney stones    Hyperlipidemia    Hypertension    Hypothyroid    Nephrolithiasis    PUD (peptic ulcer disease)     PAST SURGICAL HISTORY: Past Surgical History:  Procedure Laterality Date   ABDOMINAL AORTIC ENDOVASCULAR STENT GRAFT Bilateral 03/29/2023   Procedure: ABDOMINAL AORTIC ENDOVASCULAR STENT GRAFT;  Surgeon: Leonie Douglas, MD;  Location: [facility] OR;  Service: Vascular;  Laterality: Bilateral;   CATARACT EXTRACTION, BILATERAL     INGUINAL HERNIA REPAIR Left    spine cyst     TONSILLECTOMY     ULTRASOUND GUIDANCE FOR VASCULAR ACCESS Bilateral 03/29/2023   Procedure: ULTRASOUND GUIDANCE FOR VASCULAR ACCESS;  Surgeon: Leonie Douglas, MD;  Location: [facility] OR;  Service: Vascular;  Laterality: Bilateral;    FAMILY HISTORY: Family History  Problem Relation Age of Onset   AAA (abdominal aortic aneurysm) Father    AAA (abdominal aortic aneurysm)  Brother    Alzheimer's disease Brother    Panic disorder Son     SOCIAL HISTORY: Social History   Socioeconomic History   Marital status: Single    Spouse name: Not on file   Number of children: 3   Years of education: Not on file   Highest education level: Not on file  Occupational History   Not on file  Tobacco Use   Smoking status: Former    Current packs/day: 0.00    Average packs/day: 1 pack/day for 45.0 years (45.0 ttl pk-yrs)    Types: Cigarettes    Start date: 34    Quit date: 62    Years since quitting: 48.2   Smokeless tobacco: Former  Quit date: 11/21/1975  Vaping Use   Vaping status: Never Used  Substance and Sexual Activity   Alcohol use: Yes    Alcohol/week: 7.0 standard drinks of alcohol    Types: 7 Glasses of wine per week    Comment: glass if wine in the pm   Drug use: No   Sexual activity: Not on file  Other Topics Concern   Not on file  Social History Narrative   Not on file   Social Drivers of Health   Financial Resource Strain: Not on file  Food Insecurity: No Food Insecurity (09/27/2023)   Hunger Vital Sign    Worried About Running Out of Food in the Last Year: Never true    Ran Out of Food in the Last Year: Never true  Transportation Needs: No Transportation Needs (09/27/2023)   PRAPARE - Administrator, Civil Service (Medical): No    Lack of Transportation (Non-Medical): No  Physical Activity: Not on file  Stress: Not on file  Social Connections: Not on file  Intimate Partner Violence: Not At Risk (09/27/2023)   Humiliation, Afraid, Rape, and Kick questionnaire    Fear of Current or Ex-Partner: No    Emotionally Abused: No    Physically Abused: No    Sexually Abused: No    PHYSICAL EXAM  GENERAL EXAM/CONSTITUTIONAL: Vitals:  Vitals:   01/22/24 1044  BP: 104/68  Pulse: 91  Weight: 186 lb 8 oz (84.6 kg)  Height: 6' (1.829 m)   Body mass index is 25.29 kg/m. Wt Readings from Last 3 Encounters:  01/22/24 186  lb 8 oz (84.6 kg)  11/27/23 193 lb (87.5 kg)  09/26/23 187 lb 6.3 oz (85 kg)   Patient is in no distress; well developed, nourished and groomed; neck is supple  MUSCULOSKELETAL: Gait, strength, tone, movements noted in Neurologic exam below  NEUROLOGIC: MENTAL STATUS:     01/22/2024   10:48 AM 08/22/2021   12:56 PM  MMSE - Mini Mental State Exam  Orientation to time 1 2  Orientation to Place 4 3  Registration 3 3  Attention/ Calculation 0 1  Recall 0 0  Language- name 2 objects 2 2  Language- repeat 0 1  Language- follow 3 step command 3 3  Language- read & follow direction 1 1  Write a sentence 1 1  Copy design 1 0  Total score 16 17    CRANIAL NERVE:  2nd, 3rd, 4th, 6th- visual fields full to confrontation, extraocular muscles intact, no nystagmus 5th - facial sensation symmetric 7th - facial strength symmetric 8th - hearing intact 9th - palate elevates symmetrically, uvula midline 11th - shoulder shrug symmetric 12th - tongue protrusion midline  MOTOR:  normal bulk and tone, full strength in the BUE, BLE  SENSORY:  normal and symmetric to light touch  COORDINATION:  finger-nose-finger, fine finger movements normal  GAIT/STATION:  normal   DIAGNOSTIC DATA (LABS, IMAGING, TESTING) - I reviewed patient records, labs, notes, testing and imaging myself where available.  Lab Results  Component Value Date   WBC 5.4 09/27/2023   HGB 13.8 09/27/2023   HCT 42.8 09/27/2023   MCV 95.5 09/27/2023   PLT 179 09/27/2023      Component Value Date/Time   NA 137 09/27/2023 0440   NA 140 07/03/2023 0952   K 3.8 09/27/2023 0440   CL 101 09/27/2023 0440   CO2 27 09/27/2023 0440   GLUCOSE 102 (H) 09/27/2023 0440   BUN 14  09/27/2023 0440   BUN 12 07/03/2023 0952   CREATININE 0.85 09/27/2023 0440   CALCIUM 8.5 (L) 09/27/2023 0440   PROT 7.0 09/26/2023 1732   PROT 6.1 07/03/2023 0952   ALBUMIN 3.8 09/26/2023 1732   ALBUMIN 4.0 07/03/2023 0952   AST 22 09/26/2023  1732   ALT 19 09/26/2023 1732   ALKPHOS 58 09/26/2023 1732   BILITOT 0.8 09/26/2023 1732   BILITOT 0.5 07/03/2023 0952   GFRNONAA >60 09/27/2023 0440   GFRAA 92 01/29/2020 1016   Lab Results  Component Value Date   CHOL 126 07/03/2023   HDL 42 07/03/2023   LDLCALC 68 07/03/2023   TRIG 82 07/03/2023   CHOLHDL 3.0 07/03/2023   No results found for: "HGBA1C" Lab Results  Component Value Date   VITAMINB12 650 08/22/2021   Lab Results  Component Value Date   TSH 0.184 (L) 09/26/2023       ASSESSMENT AND PLAN  89 y.o. year old male with hypertension, hyperlipidemia, hypothyroidism, memory loss who is presenting for management of memory loss.  He tells me that he is stable, still independent in all activities of daily living, family does help with his checkbook and medications.  He scored a 16 out of 30 on the MoCA indicative of moderate impairment.  I believe patient is on the cusp of mild cognitive impairment versus mild dementia.  At this time we will start him on Exelon low-dose since he could not tolerate Aricept.  If he does any side effect from the Exelon, advised him to discontinue medication and contact me.  Will also obtain ATN profile to look for Alzheimer disease biomarkers.  I will contact him to go over the result.  I will see him in 1 year for follow-up or sooner if worse.   1. Mild cognitive impairment      Patient Instructions  Start Exelon 1.5 mg twice daily, please stop the medication if you have any side effect  ATN profile to look for Alzheimer dementia biomarkers Continue your other medications  Continue to follow up with PCP  Return in a year or sooner if worse    There are well-accepted and sensible ways to reduce risk for Alzheimers disease and other degenerative brain disorders .  Exercise Daily Walk A daily 20 minute walk should be part of your routine. Disease related apathy can be a significant roadblock to exercise and the only way to overcome  this is to make it a daily routine and perhaps have a reward at the end (something your loved one loves to eat or drink perhaps) or a personal trainer coming to the home can also be very useful. Most importantly, the patient is much more likely to exercise if the caregiver / spouse does it with him/her. In general a structured, repetitive schedule is best.  General Health: Any diseases which effect your body will effect your brain such as a pneumonia, urinary infection, blood clot, heart attack or stroke. Keep contact with your primary care doctor for regular follow ups.  Sleep. A good nights sleep is healthy for the brain. Seven hours is recommended. If you have insomnia or poor sleep habits we can give you some instructions. If you have sleep apnea wear your mask.  Diet: Eating a heart healthy diet is also a good idea; fish and poultry instead of red meat, nuts (mostly non-peanuts), vegetables, fruits, olive oil or canola oil (instead of butter), minimal salt (use other spices to flavor foods), whole grain  rice, bread, cereal and pasta and wine in moderation.Research is now showing that the MIND diet, which is a combination of The Mediterranean diet and the DASH diet, is beneficial for cognitive processing and longevity. Information about this diet can be found in The MIND Diet, a book by Alonna Minium, MS, RDN, and online at [employer]  Finances, Power of [address] and Advance Directives: You should consider putting legal safeguards in place with regard to financial and medical decision making. While the spouse always has power of attorney for medical and financial issues in the absence of any form, you should consider what you want in case the spouse / caregiver is no longer around or capable of making decisions.   Orders Placed This Encounter  Procedures   ATN PROFILE    Meds ordered this encounter  Medications   rivastigmine (EXELON) 1.5 MG capsule    Sig:  Take 1 capsule (1.5 mg total) by mouth 2 (two) times daily.    Dispense:  60 capsule    Refill:  0    Return in about 1 year (around 01/21/2025).    Windell Norfolk, MD 01/22/2024, 1:05 PM  [facility] [address] [place], [place] [address] [phone]

## 2024-01-22 NOTE — Patient Instructions (Addendum)
 Start Exelon 1.5 mg twice daily, please stop the medication if you have any side effect  ATN profile to look for Alzheimer dementia biomarkers Continue your other medications  Continue to follow up with PCP  Return in a year or sooner if worse    There are well-accepted and sensible ways to reduce risk for Alzheimers disease and other degenerative brain disorders .  Exercise Daily Walk A daily 20 minute walk should be part of your routine. Disease related apathy can be a significant roadblock to exercise and the only way to overcome this is to make it a daily routine and perhaps have a reward at the end (something your loved one loves to eat or drink perhaps) or a personal trainer coming to the home can also be very useful. Most importantly, the patient is much more likely to exercise if the caregiver / spouse does it with him/her. In general a structured, repetitive schedule is best.  General Health: Any diseases which effect your body will effect your brain such as a pneumonia, urinary infection, blood clot, heart attack or stroke. Keep contact with your primary care doctor for regular follow ups.  Sleep. A good nights sleep is healthy for the brain. Seven hours is recommended. If you have insomnia or poor sleep habits we can give you some instructions. If you have sleep apnea wear your mask.  Diet: Eating a heart healthy diet is also a good idea; fish and poultry instead of red meat, nuts (mostly non-peanuts), vegetables, fruits, olive oil or canola oil (instead of butter), minimal salt (use other spices to flavor foods), whole grain rice, bread, cereal and pasta and wine in moderation.Research is now showing that the MIND diet, which is a combination of The Mediterranean diet and the DASH diet, is beneficial for cognitive processing and longevity. Information about this diet can be found in The MIND Diet, a book by Alonna Minium, MS, RDN, and online at  [employer]  Finances, Power of [address] and Advance Directives: You should consider putting legal safeguards in place with regard to financial and medical decision making. While the spouse always has power of attorney for medical and financial issues in the absence of any form, you should consider what you want in case the spouse / caregiver is no longer around or capable of making decisions.

## 2024-01-26 LAB — ATN PROFILE
A -- Beta-amyloid 42/40 Ratio: 0.099 — ABNORMAL LOW (ref >0.102)
Beta-amyloid 40: 202.1 pg/mL
Beta-amyloid 42: 20.01 pg/mL
N -- NfL, Plasma: 4.39 pg/mL (ref 0.00–11.55)
T -- p-tau181: 1.31 pg/mL — ABNORMAL HIGH (ref 0.00–0.97)

## 2024-01-28 ENCOUNTER — Encounter: Payer: Self-pay | Admitting: Neurology

## 2024-02-04 DIAGNOSIS — E559 Vitamin D deficiency, unspecified: Secondary | ICD-10-CM | POA: Diagnosis not present

## 2024-02-04 DIAGNOSIS — Z0000 Encounter for general adult medical examination without abnormal findings: Secondary | ICD-10-CM | POA: Diagnosis not present

## 2024-02-04 DIAGNOSIS — E785 Hyperlipidemia, unspecified: Secondary | ICD-10-CM | POA: Diagnosis not present

## 2024-02-04 DIAGNOSIS — I129 Hypertensive chronic kidney disease with stage 1 through stage 4 chronic kidney disease, or unspecified chronic kidney disease: Secondary | ICD-10-CM | POA: Diagnosis not present

## 2024-02-04 LAB — LAB REPORT - SCANNED: EGFR: 84

## 2024-02-11 DIAGNOSIS — E559 Vitamin D deficiency, unspecified: Secondary | ICD-10-CM | POA: Diagnosis not present

## 2024-02-11 DIAGNOSIS — I10 Essential (primary) hypertension: Secondary | ICD-10-CM | POA: Diagnosis not present

## 2024-02-11 DIAGNOSIS — E785 Hyperlipidemia, unspecified: Secondary | ICD-10-CM | POA: Diagnosis not present

## 2024-02-11 DIAGNOSIS — E039 Hypothyroidism, unspecified: Secondary | ICD-10-CM | POA: Diagnosis not present

## 2024-02-20 DIAGNOSIS — M25561 Pain in right knee: Secondary | ICD-10-CM | POA: Diagnosis not present

## 2024-04-01 DIAGNOSIS — M25561 Pain in right knee: Secondary | ICD-10-CM | POA: Diagnosis not present

## 2024-04-05 ENCOUNTER — Other Ambulatory Visit: Payer: Self-pay | Admitting: Neurology

## 2024-04-06 ENCOUNTER — Other Ambulatory Visit: Payer: Self-pay | Admitting: Cardiology

## 2024-04-16 DIAGNOSIS — H43813 Vitreous degeneration, bilateral: Secondary | ICD-10-CM | POA: Diagnosis not present

## 2024-04-24 ENCOUNTER — Other Ambulatory Visit: Payer: Self-pay | Admitting: Cardiology

## 2024-04-24 DIAGNOSIS — E7800 Pure hypercholesterolemia, unspecified: Secondary | ICD-10-CM

## 2024-05-10 ENCOUNTER — Other Ambulatory Visit: Payer: Self-pay | Admitting: Cardiology

## 2024-05-10 DIAGNOSIS — E7800 Pure hypercholesterolemia, unspecified: Secondary | ICD-10-CM

## 2024-05-13 DIAGNOSIS — M25561 Pain in right knee: Secondary | ICD-10-CM | POA: Diagnosis not present

## 2024-06-04 DIAGNOSIS — R0609 Other forms of dyspnea: Secondary | ICD-10-CM | POA: Diagnosis not present

## 2024-06-04 DIAGNOSIS — R3129 Other microscopic hematuria: Secondary | ICD-10-CM | POA: Diagnosis not present

## 2024-06-04 DIAGNOSIS — E039 Hypothyroidism, unspecified: Secondary | ICD-10-CM | POA: Diagnosis not present

## 2024-06-04 DIAGNOSIS — R5383 Other fatigue: Secondary | ICD-10-CM | POA: Diagnosis not present

## 2024-06-04 DIAGNOSIS — R5381 Other malaise: Secondary | ICD-10-CM | POA: Diagnosis not present

## 2024-06-11 ENCOUNTER — Other Ambulatory Visit: Payer: Self-pay | Admitting: Neurology

## 2024-06-11 DIAGNOSIS — N529 Male erectile dysfunction, unspecified: Secondary | ICD-10-CM | POA: Diagnosis not present

## 2024-06-11 DIAGNOSIS — M1711 Unilateral primary osteoarthritis, right knee: Secondary | ICD-10-CM | POA: Diagnosis not present

## 2024-06-11 DIAGNOSIS — Z79899 Other long term (current) drug therapy: Secondary | ICD-10-CM | POA: Diagnosis not present

## 2024-06-11 DIAGNOSIS — Z9841 Cataract extraction status, right eye: Secondary | ICD-10-CM | POA: Diagnosis not present

## 2024-06-11 DIAGNOSIS — Z7982 Long term (current) use of aspirin: Secondary | ICD-10-CM | POA: Diagnosis not present

## 2024-06-11 DIAGNOSIS — Z9842 Cataract extraction status, left eye: Secondary | ICD-10-CM | POA: Diagnosis not present

## 2024-06-11 DIAGNOSIS — K5790 Diverticulosis of intestine, part unspecified, without perforation or abscess without bleeding: Secondary | ICD-10-CM | POA: Diagnosis not present

## 2024-06-11 DIAGNOSIS — J310 Chronic rhinitis: Secondary | ICD-10-CM | POA: Diagnosis not present

## 2024-06-11 DIAGNOSIS — I10 Essential (primary) hypertension: Secondary | ICD-10-CM | POA: Diagnosis not present

## 2024-06-11 DIAGNOSIS — Z9181 History of falling: Secondary | ICD-10-CM | POA: Diagnosis not present

## 2024-06-11 DIAGNOSIS — R911 Solitary pulmonary nodule: Secondary | ICD-10-CM | POA: Diagnosis not present

## 2024-06-11 DIAGNOSIS — Z556 Problems related to health literacy: Secondary | ICD-10-CM | POA: Diagnosis not present

## 2024-06-11 DIAGNOSIS — E785 Hyperlipidemia, unspecified: Secondary | ICD-10-CM | POA: Diagnosis not present

## 2024-06-11 DIAGNOSIS — Z8701 Personal history of pneumonia (recurrent): Secondary | ICD-10-CM | POA: Diagnosis not present

## 2024-06-11 DIAGNOSIS — Z9089 Acquired absence of other organs: Secondary | ICD-10-CM | POA: Diagnosis not present

## 2024-06-11 DIAGNOSIS — E559 Vitamin D deficiency, unspecified: Secondary | ICD-10-CM | POA: Diagnosis not present

## 2024-06-11 DIAGNOSIS — E039 Hypothyroidism, unspecified: Secondary | ICD-10-CM | POA: Diagnosis not present

## 2024-06-18 DIAGNOSIS — E785 Hyperlipidemia, unspecified: Secondary | ICD-10-CM | POA: Diagnosis not present

## 2024-06-18 DIAGNOSIS — E039 Hypothyroidism, unspecified: Secondary | ICD-10-CM | POA: Diagnosis not present

## 2024-06-18 DIAGNOSIS — E559 Vitamin D deficiency, unspecified: Secondary | ICD-10-CM | POA: Diagnosis not present

## 2024-06-18 DIAGNOSIS — Z556 Problems related to health literacy: Secondary | ICD-10-CM | POA: Diagnosis not present

## 2024-06-18 DIAGNOSIS — Z9181 History of falling: Secondary | ICD-10-CM | POA: Diagnosis not present

## 2024-06-18 DIAGNOSIS — Z79899 Other long term (current) drug therapy: Secondary | ICD-10-CM | POA: Diagnosis not present

## 2024-06-18 DIAGNOSIS — Z9841 Cataract extraction status, right eye: Secondary | ICD-10-CM | POA: Diagnosis not present

## 2024-06-18 DIAGNOSIS — Z9089 Acquired absence of other organs: Secondary | ICD-10-CM | POA: Diagnosis not present

## 2024-06-18 DIAGNOSIS — N529 Male erectile dysfunction, unspecified: Secondary | ICD-10-CM | POA: Diagnosis not present

## 2024-06-18 DIAGNOSIS — Z9842 Cataract extraction status, left eye: Secondary | ICD-10-CM | POA: Diagnosis not present

## 2024-06-18 DIAGNOSIS — K5790 Diverticulosis of intestine, part unspecified, without perforation or abscess without bleeding: Secondary | ICD-10-CM | POA: Diagnosis not present

## 2024-06-18 DIAGNOSIS — Z7982 Long term (current) use of aspirin: Secondary | ICD-10-CM | POA: Diagnosis not present

## 2024-06-18 DIAGNOSIS — R911 Solitary pulmonary nodule: Secondary | ICD-10-CM | POA: Diagnosis not present

## 2024-06-18 DIAGNOSIS — I10 Essential (primary) hypertension: Secondary | ICD-10-CM | POA: Diagnosis not present

## 2024-06-18 DIAGNOSIS — Z8701 Personal history of pneumonia (recurrent): Secondary | ICD-10-CM | POA: Diagnosis not present

## 2024-06-18 DIAGNOSIS — J310 Chronic rhinitis: Secondary | ICD-10-CM | POA: Diagnosis not present

## 2024-06-18 DIAGNOSIS — M1711 Unilateral primary osteoarthritis, right knee: Secondary | ICD-10-CM | POA: Diagnosis not present

## 2024-06-27 DIAGNOSIS — Z9181 History of falling: Secondary | ICD-10-CM | POA: Diagnosis not present

## 2024-06-27 DIAGNOSIS — J310 Chronic rhinitis: Secondary | ICD-10-CM | POA: Diagnosis not present

## 2024-06-27 DIAGNOSIS — N529 Male erectile dysfunction, unspecified: Secondary | ICD-10-CM | POA: Diagnosis not present

## 2024-06-27 DIAGNOSIS — Z9089 Acquired absence of other organs: Secondary | ICD-10-CM | POA: Diagnosis not present

## 2024-06-27 DIAGNOSIS — E039 Hypothyroidism, unspecified: Secondary | ICD-10-CM | POA: Diagnosis not present

## 2024-06-27 DIAGNOSIS — Z8701 Personal history of pneumonia (recurrent): Secondary | ICD-10-CM | POA: Diagnosis not present

## 2024-06-27 DIAGNOSIS — R911 Solitary pulmonary nodule: Secondary | ICD-10-CM | POA: Diagnosis not present

## 2024-06-27 DIAGNOSIS — Z9842 Cataract extraction status, left eye: Secondary | ICD-10-CM | POA: Diagnosis not present

## 2024-06-27 DIAGNOSIS — Z7982 Long term (current) use of aspirin: Secondary | ICD-10-CM | POA: Diagnosis not present

## 2024-06-27 DIAGNOSIS — Z79899 Other long term (current) drug therapy: Secondary | ICD-10-CM | POA: Diagnosis not present

## 2024-06-27 DIAGNOSIS — E785 Hyperlipidemia, unspecified: Secondary | ICD-10-CM | POA: Diagnosis not present

## 2024-06-27 DIAGNOSIS — K5790 Diverticulosis of intestine, part unspecified, without perforation or abscess without bleeding: Secondary | ICD-10-CM | POA: Diagnosis not present

## 2024-06-27 DIAGNOSIS — I10 Essential (primary) hypertension: Secondary | ICD-10-CM | POA: Diagnosis not present

## 2024-06-27 DIAGNOSIS — M1711 Unilateral primary osteoarthritis, right knee: Secondary | ICD-10-CM | POA: Diagnosis not present

## 2024-06-27 DIAGNOSIS — E559 Vitamin D deficiency, unspecified: Secondary | ICD-10-CM | POA: Diagnosis not present

## 2024-06-27 DIAGNOSIS — Z556 Problems related to health literacy: Secondary | ICD-10-CM | POA: Diagnosis not present

## 2024-06-27 DIAGNOSIS — Z9841 Cataract extraction status, right eye: Secondary | ICD-10-CM | POA: Diagnosis not present

## 2024-07-02 DIAGNOSIS — R911 Solitary pulmonary nodule: Secondary | ICD-10-CM | POA: Diagnosis not present

## 2024-07-02 DIAGNOSIS — K5790 Diverticulosis of intestine, part unspecified, without perforation or abscess without bleeding: Secondary | ICD-10-CM | POA: Diagnosis not present

## 2024-07-02 DIAGNOSIS — N529 Male erectile dysfunction, unspecified: Secondary | ICD-10-CM | POA: Diagnosis not present

## 2024-07-02 DIAGNOSIS — E559 Vitamin D deficiency, unspecified: Secondary | ICD-10-CM | POA: Diagnosis not present

## 2024-07-02 DIAGNOSIS — Z9842 Cataract extraction status, left eye: Secondary | ICD-10-CM | POA: Diagnosis not present

## 2024-07-02 DIAGNOSIS — E785 Hyperlipidemia, unspecified: Secondary | ICD-10-CM | POA: Diagnosis not present

## 2024-07-02 DIAGNOSIS — M1711 Unilateral primary osteoarthritis, right knee: Secondary | ICD-10-CM | POA: Diagnosis not present

## 2024-07-02 DIAGNOSIS — Z79899 Other long term (current) drug therapy: Secondary | ICD-10-CM | POA: Diagnosis not present

## 2024-07-02 DIAGNOSIS — Z8701 Personal history of pneumonia (recurrent): Secondary | ICD-10-CM | POA: Diagnosis not present

## 2024-07-02 DIAGNOSIS — Z556 Problems related to health literacy: Secondary | ICD-10-CM | POA: Diagnosis not present

## 2024-07-02 DIAGNOSIS — Z9089 Acquired absence of other organs: Secondary | ICD-10-CM | POA: Diagnosis not present

## 2024-07-02 DIAGNOSIS — Z9841 Cataract extraction status, right eye: Secondary | ICD-10-CM | POA: Diagnosis not present

## 2024-07-02 DIAGNOSIS — J310 Chronic rhinitis: Secondary | ICD-10-CM | POA: Diagnosis not present

## 2024-07-02 DIAGNOSIS — I10 Essential (primary) hypertension: Secondary | ICD-10-CM | POA: Diagnosis not present

## 2024-07-02 DIAGNOSIS — Z9181 History of falling: Secondary | ICD-10-CM | POA: Diagnosis not present

## 2024-07-02 DIAGNOSIS — E039 Hypothyroidism, unspecified: Secondary | ICD-10-CM | POA: Diagnosis not present

## 2024-07-02 DIAGNOSIS — Z7982 Long term (current) use of aspirin: Secondary | ICD-10-CM | POA: Diagnosis not present

## 2024-07-09 DIAGNOSIS — J310 Chronic rhinitis: Secondary | ICD-10-CM | POA: Diagnosis not present

## 2024-07-09 DIAGNOSIS — Z9841 Cataract extraction status, right eye: Secondary | ICD-10-CM | POA: Diagnosis not present

## 2024-07-09 DIAGNOSIS — Z7982 Long term (current) use of aspirin: Secondary | ICD-10-CM | POA: Diagnosis not present

## 2024-07-09 DIAGNOSIS — E559 Vitamin D deficiency, unspecified: Secondary | ICD-10-CM | POA: Diagnosis not present

## 2024-07-09 DIAGNOSIS — N529 Male erectile dysfunction, unspecified: Secondary | ICD-10-CM | POA: Diagnosis not present

## 2024-07-09 DIAGNOSIS — M1711 Unilateral primary osteoarthritis, right knee: Secondary | ICD-10-CM | POA: Diagnosis not present

## 2024-07-09 DIAGNOSIS — I10 Essential (primary) hypertension: Secondary | ICD-10-CM | POA: Diagnosis not present

## 2024-07-09 DIAGNOSIS — Z79899 Other long term (current) drug therapy: Secondary | ICD-10-CM | POA: Diagnosis not present

## 2024-07-09 DIAGNOSIS — K5790 Diverticulosis of intestine, part unspecified, without perforation or abscess without bleeding: Secondary | ICD-10-CM | POA: Diagnosis not present

## 2024-07-09 DIAGNOSIS — Z9181 History of falling: Secondary | ICD-10-CM | POA: Diagnosis not present

## 2024-07-09 DIAGNOSIS — Z9089 Acquired absence of other organs: Secondary | ICD-10-CM | POA: Diagnosis not present

## 2024-07-09 DIAGNOSIS — E785 Hyperlipidemia, unspecified: Secondary | ICD-10-CM | POA: Diagnosis not present

## 2024-07-09 DIAGNOSIS — E039 Hypothyroidism, unspecified: Secondary | ICD-10-CM | POA: Diagnosis not present

## 2024-07-09 DIAGNOSIS — Z8701 Personal history of pneumonia (recurrent): Secondary | ICD-10-CM | POA: Diagnosis not present

## 2024-07-09 DIAGNOSIS — Z556 Problems related to health literacy: Secondary | ICD-10-CM | POA: Diagnosis not present

## 2024-07-09 DIAGNOSIS — Z9842 Cataract extraction status, left eye: Secondary | ICD-10-CM | POA: Diagnosis not present

## 2024-07-09 DIAGNOSIS — R911 Solitary pulmonary nodule: Secondary | ICD-10-CM | POA: Diagnosis not present

## 2024-07-14 ENCOUNTER — Ambulatory Visit (INDEPENDENT_AMBULATORY_CARE_PROVIDER_SITE_OTHER)

## 2024-07-14 VITALS — BP 142/78 | HR 77 | Ht 72.0 in | Wt 198.0 lb

## 2024-07-14 DIAGNOSIS — R0602 Shortness of breath: Secondary | ICD-10-CM | POA: Diagnosis not present

## 2024-07-14 DIAGNOSIS — Z87891 Personal history of nicotine dependence: Secondary | ICD-10-CM

## 2024-07-14 MED ORDER — FLUTICASONE PROPIONATE 50 MCG/ACT NA SUSP: 2.0000 | Freq: Every day | NASAL | 0 refills | Status: AC

## 2024-07-14 MED ORDER — ALBUTEROL SULFATE HFA 108 (90 BASE) MCG/ACT IN AERS: 2.0000 | INHALATION_SPRAY | RESPIRATORY_TRACT | 6 refills | Status: AC | PRN

## 2024-07-14 NOTE — Patient Instructions (Addendum)
 Please take Albuterol  1-2 every 4 hours as needed when you feel short of breath  We will schedule lung function tests for you (PFTs)  Use Flonase  if your nose is too stuffy. Use it everyday for at least 3 weeks to feel some benefit.   We will see you back in the clinic in 2 months

## 2024-07-14 NOTE — Progress Notes (Signed)
 Subjective:   PATIENT ID: Brandon Valdez GENDER: male DOB: Apr 12, 1934, MRN: [id number]   HPI 89 year old male with a past medical history of hypertension, hyperlipidemia, hypothyroidism, mild cognitive impairment who is referred to the pulmonary clinic for further evaluation of shortness of breath.  Patient is a poor historian, states that he feels short of breath on exertion however also states that his activity is very limited due to right knee problems.  He denies any coughing or sputum production.  He denies any fevers, chills or night sweats.  He states that his appetite is good and he has not lost any weight.  He denies any acid reflux or heartburn.  He endorses having some postnasal drip.  He was seen in the pulmonary clinic in 2020 and at that time, he reported very short-lived episodes of air gasping that would self resolve concerning for anxiety related episodes versus vocal cord dysfunction.  He is a former smoker quit smoking in 1978 and started smoking when he was 20.  He is not sure how much he smoked on average.  Past Medical History:  Diagnosis Date   AAA (abdominal aortic aneurysm) (HCC)    Arthritis    History of kidney stones    Hyperlipidemia    Hypertension    Hypothyroid    Nephrolithiasis    PUD (peptic ulcer disease)      Family History  Problem Relation Age of Onset   AAA (abdominal aortic aneurysm) Father    AAA (abdominal aortic aneurysm) Brother    Alzheimer's disease Brother    Panic disorder Son      Social History   Socioeconomic History   Marital status: Single    Spouse name: Not on file   Number of children: 3   Years of education: Not on file   Highest education level: Not on file  Occupational History   Not on file  Tobacco Use   Smoking status: Former    Current packs/day: 0.00    Average packs/day: 1 pack/day for 45.0 years (45.0 ttl pk-yrs)    Types: Cigarettes    Start date: 28    Quit date: 39    Years since  quitting: 48.6   Smokeless tobacco: Former    Quit date: 11/21/1975  Vaping Use   Vaping status: Never Used  Substance and Sexual Activity   Alcohol use: Yes    Alcohol/week: 7.0 standard drinks of alcohol    Types: 7 Glasses of wine per week    Comment: glass if wine in the pm   Drug use: No   Sexual activity: Not on file  Other Topics Concern   Not on file  Social History Narrative   Not on file   Social Drivers of Health   Financial Resource Strain: Not on file  Food Insecurity: No Food Insecurity (09/27/2023)   Hunger Vital Sign    Worried About Running Out of Food in the Last Year: Never true    Ran Out of Food in the Last Year: Never true  Transportation Needs: No Transportation Needs (09/27/2023)   PRAPARE - Administrator, Civil Service (Medical): No    Lack of Transportation (Non-Medical): No  Physical Activity: Not on file  Stress: Not on file  Social Connections: Not on file  Intimate Partner Violence: Not At Risk (09/27/2023)   Humiliation, Afraid, Rape, and Kick questionnaire    Fear of Current or Ex-Partner: No    Emotionally Abused: No  Physically Abused: No    Sexually Abused: No     Allergies  Allergen Reactions   Losartan  Potassium Cough   Monopril [Fosinopril] Other (See Comments)    Dizziness     Valium [Diazepam] Other (See Comments)    Shaking, flu-like symptoms      Outpatient Medications Prior to Visit  Medication Sig Dispense Refill   acetaminophen  (TYLENOL ) 325 MG tablet Take 2 tablets (650 mg total) by mouth every 6 (six) hours as needed for mild pain (pain score 1-3) (or Fever >/= 101).     albuterol  (VENTOLIN  HFA) 108 (90 Base) MCG/ACT inhaler Inhale 2 puffs into the lungs 3 (three) times daily for 5 days, THEN 2 puffs every 6 (six) hours as needed for wheezing or shortness of breath. 8 g 0   amLODipine  (NORVASC ) 5 MG tablet Take 5 mg by mouth daily.     aspirin  81 MG tablet Take 1 tablet (81 mg total) by mouth daily. May  resume after 1 week on July 4 (Patient taking differently: Take 81 mg by mouth in the morning.) 30 tablet    cetirizine (ZYRTEC) 10 MG tablet Take 10 mg by mouth daily as needed for allergies.     escitalopram  (LEXAPRO ) 20 MG tablet Take 20 mg by mouth daily.     ezetimibe  (ZETIA ) 10 MG tablet TAKE 1 TABLET BY MOUTH DAILY 90 tablet 3   ibuprofen (ADVIL,MOTRIN) 200 MG tablet Take 400 mg by mouth every 6 (six) hours as needed for moderate pain (inflammation).     levothyroxine  (SYNTHROID ) 150 MCG tablet Take 150 mcg by mouth daily before breakfast.     loratadine  (CLARITIN ) 10 MG tablet Take 1 tablet (10 mg total) by mouth daily. 30 tablet 0   meclizine  (ANTIVERT ) 25 MG tablet Take 25 mg by mouth 3 (three) times daily as needed for dizziness.     MUCINEX  600 MG 12 hr tablet Take 600-1,200 mg by mouth 2 (two) times daily as needed for to loosen phlegm or cough.     Multiple Vitamin (MULTIVITAMIN) capsule Take 1 capsule by mouth daily.     pantoprazole  (PROTONIX ) 40 MG tablet Take 1 tablet (40 mg total) by mouth daily at 6 (six) AM. 30 tablet 1   Psyllium (METAMUCIL SMOOTH TEXTURE PO) Take 1 Scoop by mouth See admin instructions. Mix 1 scoopful into 8 ounces of a desired beverage and drink by mouth once a day     QUEtiapine  (SEROQUEL ) 100 MG tablet Take 100 mg by mouth at bedtime.     rivastigmine  (EXELON ) 1.5 MG capsule TAKE ONE CAPSULE BY MOUTH TWO TIMES DAILY 60 capsule 0   rosuvastatin  (CRESTOR ) 20 MG tablet take ONE tablet by MOUTH daily 90 tablet 2   fluticasone  (FLONASE ) 50 MCG/ACT nasal spray Place 2 sprays into both nostrils daily for 5 days, THEN 2 sprays 2 (two) times daily as needed for allergies or rhinitis. 16 g 0   No facility-administered medications prior to visit.    ROS Reviewed all systems and reported negative except as above     Objective:   Vitals:   07/14/24 1606  BP: (!) 142/78  Pulse: 77  SpO2: 94%  Weight: 198 lb (89.8 kg)  Height: 6' (1.829 m)    Physical  Exam Constitutional:      General: He is not in acute distress.    Appearance: Normal appearance. He is not ill-appearing.  HENT:     Nose: No congestion or rhinorrhea.  Mouth/Throat:     Pharynx: No oropharyngeal exudate.  Eyes:     Extraocular Movements: Extraocular movements intact.     Pupils: Pupils are equal, round, and reactive to light.  Cardiovascular:     Rate and Rhythm: Normal rate and regular rhythm.  Pulmonary:     Effort: Pulmonary effort is normal. No respiratory distress.     Breath sounds: No stridor. No wheezing, rhonchi or rales.  Abdominal:     General: Abdomen is flat.     Palpations: Abdomen is soft.  Musculoskeletal:        General: No swelling or tenderness. Normal range of motion.  Skin:    General: Skin is warm.     Capillary Refill: Capillary refill takes less than 2 seconds.  Neurological:     General: No focal deficit present.     Mental Status: Mental status is at baseline.        CBC    Component Value Date/Time   WBC 5.4 09/27/2023 0440   RBC 4.48 09/27/2023 0440   HGB 13.8 09/27/2023 0440   HCT 42.8 09/27/2023 0440   PLT 179 09/27/2023 0440   MCV 95.5 09/27/2023 0440   MCH 30.8 09/27/2023 0440   MCHC 32.2 09/27/2023 0440   RDW 13.1 09/27/2023 0440   LYMPHSABS 1.5 09/26/2023 1732   MONOABS 0.7 09/26/2023 1732   EOSABS 0.1 09/26/2023 1732   BASOSABS 0.1 09/26/2023 1732     Chest imaging:  I reviewed his imaging including a CT abdomen and pelvis done in 2020 for.  As well as a CT chest done in March 2023.  He has no evidence of parenchymal lung disease.  No evidence of emphysema or smoking-related changes.   PFT: No PFTs on file  Labs: I reviewed his labs from November 2024 he had normal chemistries.  And a normal complete blood count with no evidence of anemia.    Echo: Echocardiogram from 2023 with normal ejection fraction 50 to 55%.  The left ventricle demonstrated global hypokinesis and is diastolic parameters were  indeterminate.  His RV function and size were normal      Assessment & Plan:   Assessment & Plan Shortness of breath Patient is a poor historian but with complaints of shortness of breath.  His symptoms described are similar to what he described in 2020 with episodes of shortness of breath even at rest.  His mobility is limited due to right knee problems.  His smoking history is remote and he does not show any evidence of emphysema on his CT chest.  He also reports some nasal congestion.   Plan to obtain PFTs for further evaluation.  Prescription for albuterol  as a trial was sent.  Given his nasal congestion prescription for Flonase  was renewed.  He will follow-up in the pulmonary clinic in 2 months  Orders Placed This Encounter  Procedures   Pulmonary function test    Standing Status:   Future    Expected Date:   07/14/2024    Expiration Date:   07/14/2025    Where should this test be performed?:   [facility]    What type of PFT is being ordered?:   Full PFT         Zola Herter, MD [facility] Office: [phone]

## 2024-07-16 DIAGNOSIS — Z556 Problems related to health literacy: Secondary | ICD-10-CM | POA: Diagnosis not present

## 2024-07-16 DIAGNOSIS — N529 Male erectile dysfunction, unspecified: Secondary | ICD-10-CM | POA: Diagnosis not present

## 2024-07-16 DIAGNOSIS — R911 Solitary pulmonary nodule: Secondary | ICD-10-CM | POA: Diagnosis not present

## 2024-07-16 DIAGNOSIS — M1711 Unilateral primary osteoarthritis, right knee: Secondary | ICD-10-CM | POA: Diagnosis not present

## 2024-07-16 DIAGNOSIS — Z9181 History of falling: Secondary | ICD-10-CM | POA: Diagnosis not present

## 2024-07-16 DIAGNOSIS — Z9841 Cataract extraction status, right eye: Secondary | ICD-10-CM | POA: Diagnosis not present

## 2024-07-16 DIAGNOSIS — E039 Hypothyroidism, unspecified: Secondary | ICD-10-CM | POA: Diagnosis not present

## 2024-07-16 DIAGNOSIS — Z79899 Other long term (current) drug therapy: Secondary | ICD-10-CM | POA: Diagnosis not present

## 2024-07-16 DIAGNOSIS — I10 Essential (primary) hypertension: Secondary | ICD-10-CM | POA: Diagnosis not present

## 2024-07-16 DIAGNOSIS — E559 Vitamin D deficiency, unspecified: Secondary | ICD-10-CM | POA: Diagnosis not present

## 2024-07-16 DIAGNOSIS — Z9842 Cataract extraction status, left eye: Secondary | ICD-10-CM | POA: Diagnosis not present

## 2024-07-16 DIAGNOSIS — K5790 Diverticulosis of intestine, part unspecified, without perforation or abscess without bleeding: Secondary | ICD-10-CM | POA: Diagnosis not present

## 2024-07-16 DIAGNOSIS — Z7982 Long term (current) use of aspirin: Secondary | ICD-10-CM | POA: Diagnosis not present

## 2024-07-16 DIAGNOSIS — Z8701 Personal history of pneumonia (recurrent): Secondary | ICD-10-CM | POA: Diagnosis not present

## 2024-07-16 DIAGNOSIS — E785 Hyperlipidemia, unspecified: Secondary | ICD-10-CM | POA: Diagnosis not present

## 2024-07-16 DIAGNOSIS — J310 Chronic rhinitis: Secondary | ICD-10-CM | POA: Diagnosis not present

## 2024-07-16 DIAGNOSIS — Z9089 Acquired absence of other organs: Secondary | ICD-10-CM | POA: Diagnosis not present

## 2024-07-22 DIAGNOSIS — E039 Hypothyroidism, unspecified: Secondary | ICD-10-CM | POA: Diagnosis not present

## 2024-07-22 DIAGNOSIS — Z79899 Other long term (current) drug therapy: Secondary | ICD-10-CM | POA: Diagnosis not present

## 2024-07-22 DIAGNOSIS — Z8701 Personal history of pneumonia (recurrent): Secondary | ICD-10-CM | POA: Diagnosis not present

## 2024-07-22 DIAGNOSIS — N529 Male erectile dysfunction, unspecified: Secondary | ICD-10-CM | POA: Diagnosis not present

## 2024-07-22 DIAGNOSIS — I10 Essential (primary) hypertension: Secondary | ICD-10-CM | POA: Diagnosis not present

## 2024-07-22 DIAGNOSIS — R911 Solitary pulmonary nodule: Secondary | ICD-10-CM | POA: Diagnosis not present

## 2024-07-22 DIAGNOSIS — Z9841 Cataract extraction status, right eye: Secondary | ICD-10-CM | POA: Diagnosis not present

## 2024-07-22 DIAGNOSIS — E785 Hyperlipidemia, unspecified: Secondary | ICD-10-CM | POA: Diagnosis not present

## 2024-07-22 DIAGNOSIS — K5790 Diverticulosis of intestine, part unspecified, without perforation or abscess without bleeding: Secondary | ICD-10-CM | POA: Diagnosis not present

## 2024-07-22 DIAGNOSIS — J310 Chronic rhinitis: Secondary | ICD-10-CM | POA: Diagnosis not present

## 2024-07-22 DIAGNOSIS — E559 Vitamin D deficiency, unspecified: Secondary | ICD-10-CM | POA: Diagnosis not present

## 2024-07-22 DIAGNOSIS — M25561 Pain in right knee: Secondary | ICD-10-CM | POA: Diagnosis not present

## 2024-07-22 DIAGNOSIS — M1711 Unilateral primary osteoarthritis, right knee: Secondary | ICD-10-CM | POA: Diagnosis not present

## 2024-07-22 DIAGNOSIS — Z9089 Acquired absence of other organs: Secondary | ICD-10-CM | POA: Diagnosis not present

## 2024-07-22 DIAGNOSIS — Z556 Problems related to health literacy: Secondary | ICD-10-CM | POA: Diagnosis not present

## 2024-07-22 DIAGNOSIS — Z9181 History of falling: Secondary | ICD-10-CM | POA: Diagnosis not present

## 2024-07-22 DIAGNOSIS — Z7982 Long term (current) use of aspirin: Secondary | ICD-10-CM | POA: Diagnosis not present

## 2024-07-22 DIAGNOSIS — Z9842 Cataract extraction status, left eye: Secondary | ICD-10-CM | POA: Diagnosis not present

## 2024-07-25 ENCOUNTER — Ambulatory Visit (INDEPENDENT_AMBULATORY_CARE_PROVIDER_SITE_OTHER)

## 2024-07-25 DIAGNOSIS — R0602 Shortness of breath: Secondary | ICD-10-CM | POA: Diagnosis not present

## 2024-07-25 LAB — PULMONARY FUNCTION TEST
DL/VA % pred: 123 %
DL/VA: 4.64 ml/min/mmHg/L
DLCO cor % pred: 108 %
DLCO cor: 26.56 ml/min/mmHg
DLCO unc % pred: 108 %
DLCO unc: 26.56 ml/min/mmHg
FEF 25-75 Post: 1.56 L/s
FEF 25-75 Pre: 1.3 L/s
FEF2575-%Change-Post: 20 %
FEF2575-%Pred-Post: 90 %
FEF2575-%Pred-Pre: 75 %
FEV1-%Change-Post: 4 %
FEV1-%Pred-Post: 87 %
FEV1-%Pred-Pre: 83 %
FEV1-Post: 2.38 L
FEV1-Pre: 2.27 L
FEV1FVC-%Change-Post: 1 %
FEV1FVC-%Pred-Pre: 96 %
FEV6-%Change-Post: 4 %
FEV6-%Pred-Post: 95 %
FEV6-%Pred-Pre: 90 %
FEV6-Post: 3.46 L
FEV6-Pre: 3.31 L
FEV6FVC-%Change-Post: 1 %
FEV6FVC-%Pred-Post: 107 %
FEV6FVC-%Pred-Pre: 105 %
FVC-%Change-Post: 3 %
FVC-%Pred-Post: 88 %
FVC-%Pred-Pre: 85 %
FVC-Post: 3.49 L
FVC-Pre: 3.37 L
Post FEV1/FVC ratio: 68 %
Post FEV6/FVC ratio: 100 %
Pre FEV1/FVC ratio: 67 %
Pre FEV6/FVC Ratio: 98 %
RV % pred: 148 %
RV: 4.31 L
TLC % pred: 104 %
TLC: 7.72 L

## 2024-07-25 NOTE — Patient Instructions (Signed)
 Full PFT performed today.

## 2024-07-25 NOTE — Progress Notes (Signed)
 Full PFT performed today.

## 2024-08-06 DIAGNOSIS — Z9842 Cataract extraction status, left eye: Secondary | ICD-10-CM | POA: Diagnosis not present

## 2024-08-06 DIAGNOSIS — R911 Solitary pulmonary nodule: Secondary | ICD-10-CM | POA: Diagnosis not present

## 2024-08-06 DIAGNOSIS — M1711 Unilateral primary osteoarthritis, right knee: Secondary | ICD-10-CM | POA: Diagnosis not present

## 2024-08-06 DIAGNOSIS — Z556 Problems related to health literacy: Secondary | ICD-10-CM | POA: Diagnosis not present

## 2024-08-06 DIAGNOSIS — E785 Hyperlipidemia, unspecified: Secondary | ICD-10-CM | POA: Diagnosis not present

## 2024-08-06 DIAGNOSIS — N529 Male erectile dysfunction, unspecified: Secondary | ICD-10-CM | POA: Diagnosis not present

## 2024-08-06 DIAGNOSIS — K5790 Diverticulosis of intestine, part unspecified, without perforation or abscess without bleeding: Secondary | ICD-10-CM | POA: Diagnosis not present

## 2024-08-06 DIAGNOSIS — E039 Hypothyroidism, unspecified: Secondary | ICD-10-CM | POA: Diagnosis not present

## 2024-08-06 DIAGNOSIS — I10 Essential (primary) hypertension: Secondary | ICD-10-CM | POA: Diagnosis not present

## 2024-08-06 DIAGNOSIS — Z79899 Other long term (current) drug therapy: Secondary | ICD-10-CM | POA: Diagnosis not present

## 2024-08-06 DIAGNOSIS — J310 Chronic rhinitis: Secondary | ICD-10-CM | POA: Diagnosis not present

## 2024-08-06 DIAGNOSIS — Z9181 History of falling: Secondary | ICD-10-CM | POA: Diagnosis not present

## 2024-08-06 DIAGNOSIS — Z7982 Long term (current) use of aspirin: Secondary | ICD-10-CM | POA: Diagnosis not present

## 2024-08-06 DIAGNOSIS — Z9841 Cataract extraction status, right eye: Secondary | ICD-10-CM | POA: Diagnosis not present

## 2024-08-06 DIAGNOSIS — E559 Vitamin D deficiency, unspecified: Secondary | ICD-10-CM | POA: Diagnosis not present

## 2024-08-06 DIAGNOSIS — Z9089 Acquired absence of other organs: Secondary | ICD-10-CM | POA: Diagnosis not present

## 2024-08-06 DIAGNOSIS — Z8701 Personal history of pneumonia (recurrent): Secondary | ICD-10-CM | POA: Diagnosis not present

## 2024-08-12 ENCOUNTER — Other Ambulatory Visit: Payer: Self-pay | Admitting: Neurology

## 2024-08-18 DIAGNOSIS — E785 Hyperlipidemia, unspecified: Secondary | ICD-10-CM | POA: Diagnosis not present

## 2024-08-18 DIAGNOSIS — I10 Essential (primary) hypertension: Secondary | ICD-10-CM | POA: Diagnosis not present

## 2024-08-18 DIAGNOSIS — Z0000 Encounter for general adult medical examination without abnormal findings: Secondary | ICD-10-CM | POA: Diagnosis not present

## 2024-08-18 DIAGNOSIS — I714 Abdominal aortic aneurysm, without rupture, unspecified: Secondary | ICD-10-CM | POA: Diagnosis not present

## 2024-08-18 DIAGNOSIS — E039 Hypothyroidism, unspecified: Secondary | ICD-10-CM | POA: Diagnosis not present

## 2024-08-18 DIAGNOSIS — H6123 Impacted cerumen, bilateral: Secondary | ICD-10-CM | POA: Diagnosis not present

## 2024-09-02 DIAGNOSIS — M25561 Pain in right knee: Secondary | ICD-10-CM | POA: Diagnosis not present

## 2024-09-08 ENCOUNTER — Ambulatory Visit

## 2024-09-08 VITALS — BP 106/64 | HR 81 | Temp 98.0°F | Ht 72.0 in | Wt 188.0 lb

## 2024-09-08 DIAGNOSIS — R0602 Shortness of breath: Secondary | ICD-10-CM | POA: Diagnosis not present

## 2024-09-08 NOTE — Patient Instructions (Signed)
  VISIT SUMMARY: Today, we discussed your episodes of shortness of breath and your history of smoking. Your pulmonary function tests came back normal, indicating no lung damage from smoking. We also talked about your knee issues.  YOUR PLAN: -EVALUATION OF SHORTNESS OF BREATH: Your pulmonary function tests were normal, and there is no lung damage from your past smoking. Since your shortness of breath episodes are infrequent and brief, they are not a concern at this time. Please follow up if you notice any changes in your breathing or have any questions.  -HISTORY OF TOBACCO USE: You have successfully quit smoking, and there is no lung damage from your past smoking. Keep up the good work in maintaining smoking cessation.  INSTRUCTIONS: Please follow up if you notice any changes in your breathing or have any questions.                      Contains text generated by [employer].                                 Contains text generated by [employer].

## 2024-09-08 NOTE — Progress Notes (Signed)
 Subjective:   PATIENT ID: Rutha JAYSON Riley GENDER: male DOB: 1933-11-30, MRN: [id number]   HPI Discussed the use of AI scribe software for clinical note transcription with the patient, who gave verbal consent to proceed.   Mr. Peplinski is here for follow-up.  I initially saw and August for subjective feeling of shortness of breath.  He describes short-lived episodes of air gasping which would self resolve.  He is a former Financial controller quit in 1978.  I reviewed his CT at that time and it did not show any evidence of parenchymal lung disease.  We ordered PFTs for him which I reviewed on this clinic visit.  He has no evidence of obstruction or restriction on his spirometry.  He has normal lung volumes.  He has a normal DLCO.  History of Present Illness   He mentions having a 'bad knee' which affects his ability to walk, noting that it 'filled up with fluid' last week. He describes needing to 'get that hang thing fixed.'     Past Medical History:  Diagnosis Date   AAA (abdominal aortic aneurysm)    Arthritis    History of kidney stones    Hyperlipidemia    Hypertension    Hypothyroid    Nephrolithiasis    PUD (peptic ulcer disease)      Family History  Problem Relation Age of Onset   AAA (abdominal aortic aneurysm) Father    AAA (abdominal aortic aneurysm) Brother    Alzheimer's disease Brother    Panic disorder Son      Social History   Socioeconomic History   Marital status: Single    Spouse name: Not on file   Number of children: 3   Years of education: Not on file   Highest education level: Not on file  Occupational History   Not on file  Tobacco Use   Smoking status: Former    Current packs/day: 0.00    Average packs/day: 1 pack/day for 45.0 years (45.0 ttl pk-yrs)    Types: Cigarettes    Start date: 17    Quit date: 35    Years since quitting: 48.8   Smokeless tobacco: Former    Quit date: 11/21/1975  Vaping Use   Vaping status: Never Used  Substance  and Sexual Activity   Alcohol use: Yes    Alcohol/week: 7.0 standard drinks of alcohol    Types: 7 Glasses of wine per week    Comment: glass if wine in the pm   Drug use: No   Sexual activity: Not on file  Other Topics Concern   Not on file  Social History Narrative   Not on file   Social Drivers of Health   Financial Resource Strain: Not on file  Food Insecurity: No Food Insecurity (09/27/2023)   Hunger Vital Sign    Worried About Running Out of Food in the Last Year: Never true    Ran Out of Food in the Last Year: Never true  Transportation Needs: No Transportation Needs (09/27/2023)   PRAPARE - Administrator, Civil Service (Medical): No    Lack of Transportation (Non-Medical): No  Physical Activity: Not on file  Stress: Not on file  Social Connections: Not on file  Intimate Partner Violence: Not At Risk (09/27/2023)   Humiliation, Afraid, Rape, and Kick questionnaire    Fear of Current or Ex-Partner: No    Emotionally Abused: No    Physically Abused: No    Sexually Abused:  No     Allergies  Allergen Reactions   Losartan  Potassium Cough   Monopril [Fosinopril] Other (See Comments)    Dizziness     Valium [Diazepam] Other (See Comments)    Shaking, flu-like symptoms      Outpatient Medications Prior to Visit  Medication Sig Dispense Refill   acetaminophen  (TYLENOL ) 325 MG tablet Take 2 tablets (650 mg total) by mouth every 6 (six) hours as needed for mild pain (pain score 1-3) (or Fever >/= 101).     albuterol  (VENTOLIN  HFA) 108 (90 Base) MCG/ACT inhaler Inhale 2 puffs into the lungs 3 (three) times daily for 5 days, THEN 2 puffs every 6 (six) hours as needed for wheezing or shortness of breath. 8 g 0   albuterol  (VENTOLIN  HFA) 108 (90 Base) MCG/ACT inhaler Inhale 2 puffs into the lungs every 4 (four) hours as needed for wheezing or shortness of breath. 8 g 6   amLODipine  (NORVASC ) 5 MG tablet Take 5 mg by mouth daily.     aspirin  81 MG tablet Take 1  tablet (81 mg total) by mouth daily. May resume after 1 week on July 4 (Patient taking differently: Take 81 mg by mouth in the morning.) 30 tablet    cetirizine (ZYRTEC) 10 MG tablet Take 10 mg by mouth daily as needed for allergies.     escitalopram  (LEXAPRO ) 20 MG tablet Take 20 mg by mouth daily.     ezetimibe  (ZETIA ) 10 MG tablet TAKE 1 TABLET BY MOUTH DAILY 90 tablet 3   fluticasone  (FLONASE ) 50 MCG/ACT nasal spray Place 2 sprays into both nostrils daily for 5 days. 16 g 0   ibuprofen (ADVIL,MOTRIN) 200 MG tablet Take 400 mg by mouth every 6 (six) hours as needed for moderate pain (inflammation).     levothyroxine  (SYNTHROID ) 150 MCG tablet Take 150 mcg by mouth daily before breakfast.     loratadine  (CLARITIN ) 10 MG tablet Take 1 tablet (10 mg total) by mouth daily. 30 tablet 0   meclizine  (ANTIVERT ) 25 MG tablet Take 25 mg by mouth 3 (three) times daily as needed for dizziness.     MUCINEX  600 MG 12 hr tablet Take 600-1,200 mg by mouth 2 (two) times daily as needed for to loosen phlegm or cough.     Multiple Vitamin (MULTIVITAMIN) capsule Take 1 capsule by mouth daily.     pantoprazole  (PROTONIX ) 40 MG tablet Take 1 tablet (40 mg total) by mouth daily at 6 (six) AM. 30 tablet 1   Psyllium (METAMUCIL SMOOTH TEXTURE PO) Take 1 Scoop by mouth See admin instructions. Mix 1 scoopful into 8 ounces of a desired beverage and drink by mouth once a day     QUEtiapine  (SEROQUEL ) 100 MG tablet Take 100 mg by mouth at bedtime.     rivastigmine  (EXELON ) 1.5 MG capsule TAKE ONE CAPSULE BY MOUTH TWO TIMES DAILY 60 capsule 0   rosuvastatin  (CRESTOR ) 20 MG tablet take ONE tablet by MOUTH daily 90 tablet 2   No facility-administered medications prior to visit.    ROS Reviewed all systems and reported negative except as above     Objective:   Vitals:   09/08/24 1103  BP: 106/64  Pulse: 81  Temp: 98 F (36.7 C)  TempSrc: Oral  SpO2: 93%  Weight: 188 lb (85.3 kg)  Height: 6' (1.829 m)     Physical Exam Constitutional:      Appearance: Normal appearance.  HENT:     Nose: Nose normal.  Mouth/Throat:     Mouth: Mucous membranes are moist.  Cardiovascular:     Rate and Rhythm: Normal rate and regular rhythm.  Pulmonary:     Effort: Pulmonary effort is normal.  Abdominal:     General: Abdomen is flat.  Musculoskeletal:        General: Normal range of motion.  Skin:    General: Skin is warm.  Neurological:     Mental Status: He is alert.    Physical Exam CHEST: Lungs normal to auscultation.     CBC    Component Value Date/Time   WBC 5.4 09/27/2023 0440   RBC 4.48 09/27/2023 0440   HGB 13.8 09/27/2023 0440   HCT 42.8 09/27/2023 0440   PLT 179 09/27/2023 0440   MCV 95.5 09/27/2023 0440   MCH 30.8 09/27/2023 0440   MCHC 32.2 09/27/2023 0440   RDW 13.1 09/27/2023 0440   LYMPHSABS 1.5 09/26/2023 1732   MONOABS 0.7 09/26/2023 1732   EOSABS 0.1 09/26/2023 1732   BASOSABS 0.1 09/26/2023 1732     Chest imaging: CT chest reviewed in 2023.  No evidence of parenchymal lung disease.  PFT:    Latest Ref Rng & Units 07/25/2024   10:47 AM  PFT Results  FVC-Pre L 3.37   FVC-Predicted Pre % 85   FVC-Post L 3.49   FVC-Predicted Post % 88   Pre FEV1/FVC % % 67   Post FEV1/FCV % % 68   FEV1-Pre L 2.27   FEV1-Predicted Pre % 83   FEV1-Post L 2.38   DLCO uncorrected ml/min/mmHg 26.56   DLCO UNC% % 108   DLCO corrected ml/min/mmHg 26.56   DLCO COR %Predicted % 108   DLVA Predicted % 123   TLC L 7.72   TLC % Predicted % 104   RV % Predicted % 148    PFTs reviewed.  Interpretation as above      Assessment & Plan:   Assessment and Plan Assessment & Plan Evaluation of shortness of breath PFTs normal, no lung damage from smoking. Shortness of breath infrequent and brief, not a concern. - Follow up if changes in breathing or questions arise.  History of tobacco use No current tobacco use. No lung damage from past smoking. Smoking cessation  maintained.       Zola Herter, MD [facility] Office: [phone]

## 2024-09-15 DIAGNOSIS — H6123 Impacted cerumen, bilateral: Secondary | ICD-10-CM | POA: Diagnosis not present

## 2024-10-07 DIAGNOSIS — H9190 Unspecified hearing loss, unspecified ear: Secondary | ICD-10-CM | POA: Diagnosis not present

## 2024-10-07 DIAGNOSIS — H9319 Tinnitus, unspecified ear: Secondary | ICD-10-CM | POA: Diagnosis not present

## 2024-10-09 DIAGNOSIS — M1711 Unilateral primary osteoarthritis, right knee: Secondary | ICD-10-CM | POA: Diagnosis not present

## 2024-10-30 DIAGNOSIS — M1711 Unilateral primary osteoarthritis, right knee: Secondary | ICD-10-CM | POA: Diagnosis not present

## 2024-10-30 DIAGNOSIS — M25461 Effusion, right knee: Secondary | ICD-10-CM | POA: Diagnosis not present

## 2024-10-30 DIAGNOSIS — M542 Cervicalgia: Secondary | ICD-10-CM | POA: Diagnosis not present

## 2024-12-16 ENCOUNTER — Encounter (HOSPITAL_BASED_OUTPATIENT_CLINIC_OR_DEPARTMENT_OTHER): Payer: Self-pay

## 2024-12-16 ENCOUNTER — Other Ambulatory Visit: Payer: Self-pay

## 2024-12-16 ENCOUNTER — Emergency Department (HOSPITAL_BASED_OUTPATIENT_CLINIC_OR_DEPARTMENT_OTHER)
Admission: EM | Admit: 2024-12-16 | Discharge: 2024-12-16 | Disposition: A | Discharge status: Home / Self Care | Source: Ambulatory Visit | Attending: Emergency Medicine | Admitting: Emergency Medicine

## 2024-12-16 ENCOUNTER — Emergency Department (HOSPITAL_BASED_OUTPATIENT_CLINIC_OR_DEPARTMENT_OTHER)

## 2024-12-16 DIAGNOSIS — W000XXA Fall on same level due to ice and snow, initial encounter: Secondary | ICD-10-CM | POA: Insufficient documentation

## 2024-12-16 DIAGNOSIS — S0990XA Unspecified injury of head, initial encounter: Secondary | ICD-10-CM | POA: Diagnosis present

## 2024-12-16 DIAGNOSIS — Z7982 Long term (current) use of aspirin: Secondary | ICD-10-CM | POA: Insufficient documentation

## 2024-12-16 DIAGNOSIS — W19XXXA Unspecified fall, initial encounter: Secondary | ICD-10-CM

## 2024-12-16 DIAGNOSIS — I10 Essential (primary) hypertension: Secondary | ICD-10-CM | POA: Diagnosis not present

## 2024-12-16 DIAGNOSIS — M25531 Pain in right wrist: Secondary | ICD-10-CM | POA: Insufficient documentation

## 2024-12-16 DIAGNOSIS — S0081XA Abrasion of other part of head, initial encounter: Secondary | ICD-10-CM | POA: Insufficient documentation

## 2024-12-16 DIAGNOSIS — Z79899 Other long term (current) drug therapy: Secondary | ICD-10-CM | POA: Insufficient documentation

## 2024-12-16 NOTE — ED Notes (Signed)
 ED Provider at bedside.

## 2024-12-16 NOTE — ED Triage Notes (Signed)
 Slipped on ice, hit forehead. Abrasions noted to forehead, no active bleeding.  Denies LOC. No blood thinners  Denies any symptoms at this time  A&Ox3, not oriented to time- son states this is baseline

## 2024-12-16 NOTE — ED Provider Notes (Signed)
 " [facility] Provider Note   CSN: [id number] Arrival date & time: 12/16/24  1713     Patient presents with: Brandon Valdez is a 89 y.o. male.  Patient is an 89 year old male with a history of AAA, degenerative disc disease, hypertension, hyperlipidemia, and osteopenia who presents to the ED with son for a fall that occurred approximately 1 PM this afternoon.  Patient notes he was outside moving some close when he slipped on the ice.  Notes he landed on some rocks.  States he fell forward hitting his forehead.  Denies loss of consciousness or any nausea/vomiting.  States he is having a mild headache and pain on the right wrist but overall feels okay.  He has been able to walk since the fall.  He is not on blood thinners.  Son states this is his baseline and he is always slightly unsteady on his feet but no acute changes.  No further complaints.    Fall Associated symptoms include headaches. Pertinent negatives include no chest pain and no shortness of breath.       Prior to Admission medications  Medication Sig Start Date End Date Taking? Authorizing Provider  acetaminophen  (TYLENOL ) 325 MG tablet Take 2 tablets (650 mg total) by mouth every 6 (six) hours as needed for mild pain (pain score 1-3) (or Fever >/= 101). 09/27/23   Sebastian Toribio GAILS, MD  albuterol  (VENTOLIN  HFA) 108 [phone] Base) MCG/ACT inhaler Inhale 2 puffs into the lungs 3 (three) times daily for 5 days, THEN 2 puffs every 6 (six) hours as needed for wheezing or shortness of breath. 09/27/23 10/01/24  Sebastian Toribio GAILS, MD  albuterol  (VENTOLIN  HFA) 108 [phone] Base) MCG/ACT inhaler Inhale 2 puffs into the lungs every 4 (four) hours as needed for wheezing or shortness of breath. 07/14/24   Hattar, Zola SAILOR, MD  amLODipine  (NORVASC ) 5 MG tablet Take 5 mg by mouth daily.    [provider]  aspirin  81 MG tablet Take 1 tablet (81 mg total) by mouth daily. May resume after 1  week on July 4 Patient taking differently: Take 81 mg by mouth in the morning. 05/23/22   Krishnan, Gokul, MD  cetirizine (ZYRTEC) 10 MG tablet Take 10 mg by mouth daily as needed for allergies. 03/25/20   [provider]  escitalopram  (LEXAPRO ) 20 MG tablet Take 20 mg by mouth daily. 05/12/22   [provider]  ezetimibe  (ZETIA ) 10 MG tablet TAKE 1 TABLET BY MOUTH DAILY 04/24/24   Pietro Redell RAMAN, MD  fluticasone  (FLONASE ) 50 MCG/ACT nasal spray Place 2 sprays into both nostrils daily for 5 days. 07/14/24 09/08/24  Hattar, Zola SAILOR, MD  ibuprofen (ADVIL,MOTRIN) 200 MG tablet Take 400 mg by mouth every 6 (six) hours as needed for moderate pain (inflammation).    [provider]  levothyroxine  (SYNTHROID ) 150 MCG tablet Take 150 mcg by mouth daily before breakfast.    [provider]  loratadine  (CLARITIN ) 10 MG tablet Take 1 tablet (10 mg total) by mouth daily. 09/28/23   Sebastian Toribio GAILS, MD  meclizine  (ANTIVERT ) 25 MG tablet Take 25 mg by mouth 3 (three) times daily as needed for dizziness.    [provider]  MUCINEX  600 MG 12 hr tablet Take 600-1,200 mg by mouth 2 (two) times daily as needed for to loosen phlegm or cough.    [provider]  Multiple Vitamin (MULTIVITAMIN) capsule Take 1 capsule by  mouth daily.    [provider]  pantoprazole  (PROTONIX ) 40 MG tablet Take 1 tablet (40 mg total) by mouth daily at 6 (six) AM. 09/28/23   Sebastian Toribio GAILS, MD  Psyllium (METAMUCIL SMOOTH TEXTURE PO) Take 1 Scoop by mouth See admin instructions. Mix 1 scoopful into 8 ounces of a desired beverage and drink by mouth once a day    [provider]  QUEtiapine  (SEROQUEL ) 100 MG tablet Take 100 mg by mouth at bedtime.    [provider]  rivastigmine  (EXELON ) 1.5 MG capsule TAKE ONE CAPSULE BY MOUTH TWO TIMES DAILY 08/12/24   Gregg Lek, MD  rosuvastatin  (CRESTOR ) 20 MG tablet take ONE tablet by MOUTH daily 04/07/24   Pietro Redell RAMAN, MD    Allergies: Losartan  potassium, Monopril [fosinopril], and Valium [diazepam]    Review of Systems  Constitutional:  Negative for fever.  Respiratory:  Negative for shortness of breath.   Cardiovascular:  Negative for chest pain.  Musculoskeletal:  Positive for arthralgias.  Skin:  Positive for wound.  Neurological:  Positive for headaches. Negative for dizziness and syncope.  All other systems reviewed and are negative.   Updated Vital Signs BP (!) 151/87   Pulse 71   Temp 98 F (36.7 C) (Oral)   Resp 16   SpO2 96%   Physical Exam Constitutional:      Appearance: Normal appearance.  HENT:     Head: Normocephalic.     Comments: Abrasions noted to the forehead area.  No laceration or hematoma    Mouth/Throat:     Mouth: Mucous membranes are moist.     Pharynx: Oropharynx is clear.  Cardiovascular:     Rate and Rhythm: Normal rate.  Pulmonary:     Effort: Pulmonary effort is normal.  Musculoskeletal:     Comments: Limited range of motion of the right wrist secondary to pain on the central dorsal surface.  Minor overlying abrasions.  No obvious deformities.  Radial pulse 2+ bilaterally.  Cap refill intact.  Skin:    General: Skin is warm and dry.  Neurological:     Mental Status: He is alert and oriented to person, place, and time.  Psychiatric:        Mood and Affect: Mood normal.        Behavior: Behavior normal.     (all labs ordered are listed, but only abnormal results are displayed) Labs Reviewed - No data to display  EKG: None  Radiology: DG Wrist Complete Right Result Date: 12/16/2024 EXAM: 3 OR MORE VIEW(S) XRAY OF THE WRIST 12/16/2024 07:23:00 PM COMPARISON: None available. CLINICAL HISTORY: Patient fell. FINDINGS: BONES AND JOINTS: No acute fracture. No malalignment. 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 SOFT TISSUES: Unremarkable. IMPRESSION: 1. No acute fracture or dislocation. Electronically signed by: Morgane Naveau MD  12/16/2024 07:52 PM EST RP Workstation: [id number]   CT Head Wo Contrast Result Date: 12/16/2024 EXAM: CT HEAD WITHOUT CONTRAST 12/16/2024 06:35:38 PM TECHNIQUE: CT of the head was performed without the administration of intravenous contrast. Automated exposure control, iterative reconstruction, and/or weight based adjustment of the mA/kV was utilized to reduce the radiation dose to as low as reasonably achievable. COMPARISON: None available. CLINICAL HISTORY: Patient fell. FINDINGS: BRAIN AND VENTRICLES: No acute hemorrhage. No evidence of acute infarct. No hydrocephalus. No extra-axial collection. No mass effect or midline shift. Proportional prominence of ventricles and sulci, consistent with diffuse cerebral parenchymal volume loss. Scattered periventricular white matter hypoattenuation, consistent  with mild chronic ischemic microvascular disease. Calcified atherosclerotic plaque within cavernous/supraclinoid ICA and intradural vertebral arteries. ORBITS: Bilateral lens replacement noted. SINUSES: No acute abnormality. SOFT TISSUES AND SKULL: No acute soft tissue abnormality. No skull fracture. IMPRESSION: 1. No acute intracranial abnormality. Electronically signed by: Morgane Naveau MD 12/16/2024 07:09 PM EST RP Workstation: [id number]      Medications Ordered in the ED - No data to display                                 Medical Decision Making Patient is an 89 year old male with a history of AAA, degenerative disc disease, hypertension, hyperlipidemia, and osteopenia who presents to the ED with son for a fall that occurred at approximately 1 PM this afternoon after slipping on ice.  Please see detailed HPI above.  On exam patient is alert and in no acute distress.  Physical exam as noted above.  Does have abrasions noted to the forehead but no lacerations in need of repair.  No acute neurologic deficits noted.  Son notes patient is at baseline mentation as well as ambulation.  He is not on blood  thinners.  Noted to have pain on the forehead as well as right wrist pain.  No other abrasions or ecchymosis appreciated anywhere else.    CT of the head reviewed and negative for acute process.  X-ray of the right wrist also negative for acute fracture or dislocation.  Suspect patient's pain is musculoskeletal in nature.  Thankfully reassuring imaging today.  No concerns for further emergent workup at this time as patient is stable and at his baseline.  Stable for discharge home.  Symptomatic care discussed with patient and son and they are agreeable.  Advised to follow-up with PCP in 1 week for recheck.  Return precautions provided.  Amount and/or Complexity of Data Reviewed Radiology: ordered.      Final diagnoses:  Fall, initial encounter  Injury of head, initial encounter  Abrasion of forehead, initial encounter  Right wrist pain    ED Discharge Orders     None          Brandon Valdez 12/16/24 2041    Lenor Hollering, MD 12/16/24 2116  "

## 2024-12-16 NOTE — ED Notes (Signed)
Patient transported to CT and back without event.

## 2024-12-16 NOTE — Discharge Instructions (Signed)
 You will be sore for several days.  Take Tylenol  every 6 hours as needed for headaches or pain.  Keep scratches clean on forehead and wrist.  Follow-up with primary care doctor in 1 week for recheck.  Return to ED if any symptoms worsen including falls, severe uncontrollable headache, uncontrolled nausea/vomiting, numbness/tingling/weakness, syncopal episode.

## 2025-01-27 ENCOUNTER — Ambulatory Visit: Admitting: Neurology
